# Patient Record
Sex: Male | Born: 1940
Health system: Southern US, Community
[De-identification: ages and names within clinical notes are randomized; demographics above are authoritative.]

## PROBLEM LIST (undated history)

## (undated) DIAGNOSIS — I6529 Occlusion and stenosis of unspecified carotid artery: Secondary | ICD-10-CM

## (undated) DIAGNOSIS — H9190 Unspecified hearing loss, unspecified ear: Secondary | ICD-10-CM

## (undated) DIAGNOSIS — G4730 Sleep apnea, unspecified: Secondary | ICD-10-CM

## (undated) DIAGNOSIS — K635 Polyp of colon: Secondary | ICD-10-CM

## (undated) DIAGNOSIS — E041 Nontoxic single thyroid nodule: Secondary | ICD-10-CM

## (undated) DIAGNOSIS — E785 Hyperlipidemia, unspecified: Secondary | ICD-10-CM

## (undated) DIAGNOSIS — Z87442 Personal history of urinary calculi: Secondary | ICD-10-CM

## (undated) DIAGNOSIS — N529 Male erectile dysfunction, unspecified: Secondary | ICD-10-CM

## (undated) DIAGNOSIS — K219 Gastro-esophageal reflux disease without esophagitis: Secondary | ICD-10-CM

## (undated) DIAGNOSIS — I10 Essential (primary) hypertension: Secondary | ICD-10-CM

## (undated) DIAGNOSIS — IMO0001 Reserved for inherently not codable concepts without codable children: Secondary | ICD-10-CM

## (undated) DIAGNOSIS — M1990 Unspecified osteoarthritis, unspecified site: Secondary | ICD-10-CM

## (undated) HISTORY — PX: LITHOTRIPSY: SUR834

## (undated) HISTORY — DX: Occlusion and stenosis of unspecified carotid artery: I65.29

## (undated) HISTORY — PX: EYE SURGERY: SHX253

## (undated) HISTORY — PX: HEMORRHOID SURGERY: SHX153

## (undated) HISTORY — PX: LAPAROSCOPIC DRAINAGE RENAL CYST: SUR764

## (undated) HISTORY — DX: Polyp of colon: K63.5

## (undated) HISTORY — PX: INGUINAL HERNIA REPAIR: SUR1180

## (undated) HISTORY — PX: JOINT REPLACEMENT: SHX530

## (undated) HISTORY — PX: CYSTOSCOPY W/ STONE MANIPULATION: SHX1427

## (undated) HISTORY — DX: Hyperlipidemia, unspecified: E78.5

## (undated) HISTORY — DX: Nontoxic single thyroid nodule: E04.1

## (undated) HISTORY — DX: Personal history of urinary calculi: Z87.442

## (undated) HISTORY — DX: Essential (primary) hypertension: I10

---

## 1999-04-22 ENCOUNTER — Emergency Department (HOSPITAL_COMMUNITY): Admission: EM | Admit: 1999-04-22 | Discharge: 1999-04-22 | Payer: Self-pay | Admitting: Emergency Medicine

## 1999-04-22 ENCOUNTER — Encounter: Payer: Self-pay | Admitting: Emergency Medicine

## 1999-06-14 ENCOUNTER — Ambulatory Visit (HOSPITAL_COMMUNITY): Admission: RE | Admit: 1999-06-14 | Discharge: 1999-06-14 | Payer: Self-pay | Admitting: Internal Medicine

## 1999-08-23 ENCOUNTER — Encounter: Payer: Self-pay | Admitting: Internal Medicine

## 1999-08-23 ENCOUNTER — Ambulatory Visit (HOSPITAL_COMMUNITY): Admission: RE | Admit: 1999-08-23 | Discharge: 1999-08-23 | Payer: Self-pay | Admitting: Internal Medicine

## 2000-04-30 ENCOUNTER — Encounter: Payer: Self-pay | Admitting: Internal Medicine

## 2000-04-30 ENCOUNTER — Encounter (INDEPENDENT_AMBULATORY_CARE_PROVIDER_SITE_OTHER): Payer: Self-pay

## 2000-04-30 ENCOUNTER — Ambulatory Visit (HOSPITAL_COMMUNITY): Admission: RE | Admit: 2000-04-30 | Discharge: 2000-04-30 | Payer: Self-pay | Admitting: Gastroenterology

## 2000-09-01 ENCOUNTER — Emergency Department (HOSPITAL_COMMUNITY): Admission: EM | Admit: 2000-09-01 | Discharge: 2000-09-01 | Payer: Self-pay | Admitting: Internal Medicine

## 2000-09-01 ENCOUNTER — Encounter: Payer: Self-pay | Admitting: Emergency Medicine

## 2000-09-02 ENCOUNTER — Ambulatory Visit (HOSPITAL_COMMUNITY): Admission: RE | Admit: 2000-09-02 | Discharge: 2000-09-02 | Payer: Self-pay | Admitting: Urology

## 2000-09-02 ENCOUNTER — Encounter: Payer: Self-pay | Admitting: Urology

## 2000-09-05 ENCOUNTER — Ambulatory Visit (HOSPITAL_COMMUNITY): Admission: RE | Admit: 2000-09-05 | Discharge: 2000-09-05 | Payer: Self-pay | Admitting: Urology

## 2000-09-05 ENCOUNTER — Encounter: Payer: Self-pay | Admitting: Urology

## 2000-09-07 ENCOUNTER — Observation Stay (HOSPITAL_COMMUNITY): Admission: EM | Admit: 2000-09-07 | Discharge: 2000-09-08 | Payer: Self-pay | Admitting: Emergency Medicine

## 2000-09-07 ENCOUNTER — Encounter: Payer: Self-pay | Admitting: Urology

## 2000-09-13 ENCOUNTER — Encounter: Admission: RE | Admit: 2000-09-13 | Discharge: 2000-09-13 | Payer: Self-pay | Admitting: Urology

## 2000-09-13 ENCOUNTER — Encounter: Payer: Self-pay | Admitting: Urology

## 2000-12-17 ENCOUNTER — Encounter: Admission: RE | Admit: 2000-12-17 | Discharge: 2000-12-17 | Payer: Self-pay | Admitting: Urology

## 2000-12-17 ENCOUNTER — Encounter: Payer: Self-pay | Admitting: Urology

## 2001-08-12 ENCOUNTER — Encounter: Payer: Self-pay | Admitting: Urology

## 2001-08-12 ENCOUNTER — Encounter: Admission: RE | Admit: 2001-08-12 | Discharge: 2001-08-12 | Payer: Self-pay | Admitting: Urology

## 2001-10-02 ENCOUNTER — Encounter: Admission: RE | Admit: 2001-10-02 | Discharge: 2001-10-02 | Payer: Self-pay | Admitting: Internal Medicine

## 2001-10-02 ENCOUNTER — Encounter: Payer: Self-pay | Admitting: Internal Medicine

## 2001-10-24 ENCOUNTER — Encounter: Payer: Self-pay | Admitting: Urology

## 2001-10-24 ENCOUNTER — Encounter (INDEPENDENT_AMBULATORY_CARE_PROVIDER_SITE_OTHER): Payer: Self-pay | Admitting: Specialist

## 2001-10-24 ENCOUNTER — Ambulatory Visit (HOSPITAL_COMMUNITY): Admission: RE | Admit: 2001-10-24 | Discharge: 2001-10-24 | Payer: Self-pay | Admitting: Urology

## 2002-01-30 ENCOUNTER — Ambulatory Visit (HOSPITAL_COMMUNITY): Admission: RE | Admit: 2002-01-30 | Discharge: 2002-01-30 | Payer: Self-pay | Admitting: Internal Medicine

## 2002-01-30 ENCOUNTER — Encounter: Payer: Self-pay | Admitting: Internal Medicine

## 2002-06-17 ENCOUNTER — Encounter: Admission: RE | Admit: 2002-06-17 | Discharge: 2002-06-17 | Payer: Self-pay | Admitting: Urology

## 2002-06-17 ENCOUNTER — Encounter: Payer: Self-pay | Admitting: Urology

## 2002-11-17 ENCOUNTER — Encounter: Payer: Self-pay | Admitting: Internal Medicine

## 2003-04-05 ENCOUNTER — Ambulatory Visit (HOSPITAL_BASED_OUTPATIENT_CLINIC_OR_DEPARTMENT_OTHER): Admission: RE | Admit: 2003-04-05 | Discharge: 2003-04-05 | Payer: Self-pay | Admitting: Urology

## 2003-04-05 ENCOUNTER — Ambulatory Visit (HOSPITAL_COMMUNITY): Admission: RE | Admit: 2003-04-05 | Discharge: 2003-04-05 | Payer: Self-pay | Admitting: Urology

## 2003-11-19 ENCOUNTER — Ambulatory Visit (HOSPITAL_COMMUNITY): Admission: RE | Admit: 2003-11-19 | Discharge: 2003-11-19 | Payer: Self-pay | Admitting: General Surgery

## 2004-04-03 ENCOUNTER — Ambulatory Visit: Payer: Self-pay | Admitting: Internal Medicine

## 2004-04-17 ENCOUNTER — Ambulatory Visit: Payer: Self-pay | Admitting: Internal Medicine

## 2004-04-19 ENCOUNTER — Encounter: Payer: Self-pay | Admitting: Internal Medicine

## 2004-04-19 ENCOUNTER — Ambulatory Visit: Payer: Self-pay

## 2005-01-03 ENCOUNTER — Ambulatory Visit: Payer: Self-pay | Admitting: Internal Medicine

## 2005-01-05 ENCOUNTER — Ambulatory Visit: Payer: Self-pay | Admitting: Internal Medicine

## 2005-01-29 ENCOUNTER — Ambulatory Visit: Payer: Self-pay | Admitting: Internal Medicine

## 2005-03-07 ENCOUNTER — Ambulatory Visit: Payer: Self-pay | Admitting: Internal Medicine

## 2005-03-13 ENCOUNTER — Ambulatory Visit: Payer: Self-pay | Admitting: Internal Medicine

## 2005-10-08 ENCOUNTER — Ambulatory Visit: Payer: Self-pay | Admitting: Internal Medicine

## 2005-10-22 ENCOUNTER — Ambulatory Visit: Payer: Self-pay | Admitting: Internal Medicine

## 2005-10-30 ENCOUNTER — Ambulatory Visit: Payer: Self-pay | Admitting: Internal Medicine

## 2005-11-14 ENCOUNTER — Encounter: Payer: Self-pay | Admitting: Internal Medicine

## 2005-11-14 ENCOUNTER — Ambulatory Visit: Payer: Self-pay

## 2006-03-18 ENCOUNTER — Ambulatory Visit: Payer: Self-pay | Admitting: Internal Medicine

## 2007-01-28 ENCOUNTER — Encounter: Payer: Self-pay | Admitting: Internal Medicine

## 2007-02-12 ENCOUNTER — Ambulatory Visit: Payer: Self-pay | Admitting: Internal Medicine

## 2007-02-12 DIAGNOSIS — E785 Hyperlipidemia, unspecified: Secondary | ICD-10-CM | POA: Insufficient documentation

## 2007-02-12 DIAGNOSIS — Z860100 Personal history of colon polyps, unspecified: Secondary | ICD-10-CM | POA: Insufficient documentation

## 2007-02-12 DIAGNOSIS — I10 Essential (primary) hypertension: Secondary | ICD-10-CM

## 2007-02-12 DIAGNOSIS — Z87442 Personal history of urinary calculi: Secondary | ICD-10-CM

## 2007-02-13 ENCOUNTER — Encounter: Payer: Self-pay | Admitting: Internal Medicine

## 2007-02-13 LAB — CONVERTED CEMR LAB
ALT: 62 units/L — ABNORMAL HIGH (ref 0–53)
AST: 40 units/L — ABNORMAL HIGH (ref 0–37)
Albumin: 4.3 g/dL (ref 3.5–5.2)
Alkaline Phosphatase: 49 units/L (ref 39–117)
BUN: 20 mg/dL (ref 6–23)
Bilirubin, Direct: 0.3 mg/dL (ref 0.0–0.3)
CO2: 36 meq/L — ABNORMAL HIGH (ref 19–32)
Calcium: 9.4 mg/dL (ref 8.4–10.5)
Chloride: 103 meq/L (ref 96–112)
Cholesterol: 182 mg/dL (ref 0–200)
Creatinine, Ser: 1.2 mg/dL (ref 0.4–1.5)
GFR calc Af Amer: 78 mL/min
GFR calc non Af Amer: 65 mL/min
Glucose, Bld: 104 mg/dL — ABNORMAL HIGH (ref 70–99)
HDL: 33.2 mg/dL — ABNORMAL LOW (ref >39.0)
LDL Cholesterol: 122 mg/dL — ABNORMAL HIGH (ref 0–99)
Potassium: 4.4 meq/L (ref 3.5–5.1)
Sodium: 143 meq/L (ref 135–145)
Total Bilirubin: 1.2 mg/dL (ref 0.3–1.2)
Total CHOL/HDL Ratio: 5.5
Total Protein: 7.3 g/dL (ref 6.0–8.3)
Triglycerides: 135 mg/dL (ref 0–149)
VLDL: 27 mg/dL (ref 0–40)

## 2007-02-28 ENCOUNTER — Telehealth: Payer: Self-pay | Admitting: Internal Medicine

## 2007-03-07 ENCOUNTER — Telehealth: Payer: Self-pay | Admitting: Internal Medicine

## 2007-03-10 LAB — CONVERTED CEMR LAB
HCV Ab: NEGATIVE
Hep B S Ab: NEGATIVE
Hepatitis B Surface Ag: POSITIVE — AB

## 2007-03-11 ENCOUNTER — Ambulatory Visit: Payer: Self-pay | Admitting: Internal Medicine

## 2007-03-13 ENCOUNTER — Telehealth: Payer: Self-pay | Admitting: *Deleted

## 2007-05-19 ENCOUNTER — Ambulatory Visit: Payer: Self-pay | Admitting: Internal Medicine

## 2007-05-27 ENCOUNTER — Ambulatory Visit: Payer: Self-pay | Admitting: Internal Medicine

## 2007-07-28 ENCOUNTER — Telehealth: Payer: Self-pay | Admitting: Internal Medicine

## 2007-08-01 ENCOUNTER — Encounter: Payer: Self-pay | Admitting: Internal Medicine

## 2007-09-17 ENCOUNTER — Encounter: Payer: Self-pay | Admitting: Internal Medicine

## 2007-10-09 ENCOUNTER — Encounter: Payer: Self-pay | Admitting: Internal Medicine

## 2008-01-06 ENCOUNTER — Ambulatory Visit: Payer: Self-pay | Admitting: Internal Medicine

## 2008-04-20 ENCOUNTER — Ambulatory Visit: Payer: Self-pay | Admitting: Internal Medicine

## 2008-08-09 ENCOUNTER — Ambulatory Visit: Payer: Self-pay | Admitting: Family Medicine

## 2008-08-25 ENCOUNTER — Ambulatory Visit: Payer: Self-pay | Admitting: Internal Medicine

## 2008-11-18 LAB — HM COLONOSCOPY

## 2008-11-24 ENCOUNTER — Telehealth (INDEPENDENT_AMBULATORY_CARE_PROVIDER_SITE_OTHER): Payer: Self-pay | Admitting: *Deleted

## 2009-03-07 ENCOUNTER — Ambulatory Visit: Payer: Self-pay | Admitting: Internal Medicine

## 2009-03-08 LAB — CONVERTED CEMR LAB
ALT: 35 units/L (ref 0–53)
AST: 31 units/L (ref 0–37)
Albumin: 4.2 g/dL (ref 3.5–5.2)
Alkaline Phosphatase: 43 units/L (ref 39–117)
BUN: 15 mg/dL (ref 6–23)
Basophils Absolute: 0 10*3/uL (ref 0.0–0.1)
Basophils Relative: 0.2 % (ref 0.0–3.0)
Bilirubin, Direct: 0.1 mg/dL (ref 0.0–0.3)
CO2: 34 meq/L — ABNORMAL HIGH (ref 19–32)
Calcium: 9 mg/dL (ref 8.4–10.5)
Chloride: 100 meq/L (ref 96–112)
Cholesterol: 166 mg/dL (ref 0–200)
Creatinine, Ser: 1 mg/dL (ref 0.4–1.5)
Direct LDL: 106.9 mg/dL
Eosinophils Absolute: 0.2 10*3/uL (ref 0.0–0.7)
Eosinophils Relative: 2.7 % (ref 0.0–5.0)
Folate: 17.9 ng/mL
GFR calc non Af Amer: 79.07 mL/min (ref >60)
Glucose, Bld: 94 mg/dL (ref 70–99)
HCT: 48.2 % (ref 39.0–52.0)
HDL: 36 mg/dL — ABNORMAL LOW (ref >39.00)
Hemoglobin: 16.7 g/dL (ref 13.0–17.0)
Lymphocytes Relative: 29.4 % (ref 12.0–46.0)
Lymphs Abs: 2.1 10*3/uL (ref 0.7–4.0)
MCHC: 34.5 g/dL (ref 30.0–36.0)
MCV: 93.6 fL (ref 78.0–100.0)
Monocytes Absolute: 0.4 10*3/uL (ref 0.1–1.0)
Monocytes Relative: 5.5 % (ref 3.0–12.0)
Neutro Abs: 4.4 10*3/uL (ref 1.4–7.7)
Neutrophils Relative %: 62.2 % (ref 43.0–77.0)
PSA: 1.72 ng/mL (ref 0.10–4.00)
Platelets: 161 10*3/uL (ref 150.0–400.0)
Potassium: 4.2 meq/L (ref 3.5–5.1)
RBC: 5.15 M/uL (ref 4.22–5.81)
RDW: 12.6 % (ref 11.5–14.6)
Sodium: 144 meq/L (ref 135–145)
TSH: 1.01 microintl units/mL (ref 0.35–5.50)
Total Bilirubin: 1 mg/dL (ref 0.3–1.2)
Total CHOL/HDL Ratio: 5
Total Protein: 7.5 g/dL (ref 6.0–8.3)
Triglycerides: 206 mg/dL — ABNORMAL HIGH (ref 0.0–149.0)
VLDL: 41.2 mg/dL — ABNORMAL HIGH (ref 0.0–40.0)
Vitamin B-12: 580 pg/mL (ref 211–911)
WBC: 7.1 10*3/uL (ref 4.5–10.5)

## 2009-11-16 ENCOUNTER — Encounter: Payer: Self-pay | Admitting: Internal Medicine

## 2009-12-16 ENCOUNTER — Encounter: Payer: Self-pay | Admitting: Internal Medicine

## 2010-04-26 ENCOUNTER — Ambulatory Visit: Payer: Self-pay | Admitting: Internal Medicine

## 2010-05-02 LAB — CONVERTED CEMR LAB
AST: 25 units/L (ref 0–37)
Alkaline Phosphatase: 55 units/L (ref 39–117)
CO2: 29 meq/L (ref 19–32)
Calcium: 9.2 mg/dL (ref 8.4–10.5)
Creatinine, Ser: 1 mg/dL (ref 0.4–1.5)
GFR calc non Af Amer: 80.66 mL/min (ref >60.00)
Glucose, Bld: 91 mg/dL (ref 70–99)
Total Bilirubin: 0.8 mg/dL (ref 0.3–1.2)
Total CHOL/HDL Ratio: 4
Triglycerides: 147 mg/dL (ref 0.0–149.0)

## 2010-06-20 NOTE — Letter (Signed)
Summary: [facility]  [facility]   Imported By: Maryln Gottron 12/09/2009 12:52:53  _____________________________________________________________________  External Attachment:    Type:   Image     Comment:   External Document

## 2010-06-20 NOTE — Letter (Signed)
Summary: [facility]  [facility]   Imported By: Maryln Gottron 01/04/2010 13:28:08  _____________________________________________________________________  External Attachment:    Type:   Image     Comment:   External Document

## 2010-06-20 NOTE — Assessment & Plan Note (Signed)
Summary: emp/pt fasting/cjr   Vital Signs:  Patient profile:   70 year old male Height:      67.5 inches Weight:      179 pounds BMI:     27.72 Temp:     98.3 degrees F oral Pulse rate:   76 / minute Resp:     18 per minute BP sitting:   120 / 78  (left arm)  Vitals Entered By: Jeremy Johann CMA (April 26, 2010 9:07 AM) CC: yearly, fasting, flu shot   CC:  yearly, fasting, and flu shot.  History of Present Illness: Here for Medicare AWV:  1.   Risk factors based on Past M, S, F history:---see list 2.   Physical Activities: -- does his own yard work, exercises frequently 3.   Depression/mood: no concerns 4.   Hearing: ---wears hearing aids 5.   ADL's: --no concerns 6.   Fall Risk: --none noted 7.   Home Safety: --no concerns.  8.   Height, weight, &visual acuity--see exam---yearly eye exam: 9.   Counseling: -- advised continued excellent health habits 10.   Labs ordered based on risk factors: ---see orders 11.           Referral Coordination----none necessary 12.           Care Plan---advised regular exercise 13.            Cognitive Assessment---pt is alert, oriented, no motor or sensory deficits  Current Problems:  HYPERTENSION (ICD-401.9): tolerating meds without difficulty HYPERLIPIDEMIA (ICD-272.4): tolerating meds---needs f/u COLONIC POLYPS, HX OF (ICD-V12.72)---has regular f/u with GI   All other systems reviewed and were negative   Current Problems (verified): 1)  Hypertension  (ICD-401.9) 2)  Nephrolithiasis, Hx of  (ICD-V13.01) 3)  Hyperlipidemia  (ICD-272.4) 4)  Colonic Polyps, Hx of  (ICD-V12.72)  Current Medications (verified): 1)  Diovan Hct 80-12.5 Mg  Tabs (Valsartan-Hydrochlorothiazide) .... One By Mouth Daily 2)  Simvastatin 80 Mg  Tabs (Simvastatin) .... Take 1/2 By Mouth Every Bedtime 3)  Aspirin 81 Mg  Tbec (Aspirin) .... One By Mouth Daily  Allergies (verified): No Known Drug Allergies  Past History:  Past Medical History: Last  updated: 02-14-07 Colonic polyps, hx of Hyperlipidemia Nephrolithiasis, hx of carotid plaque--mild Hypertension  Past Surgical History: Last updated: 14-Feb-2007 Renal cysts drained. lithotripsy Hemorrhoidectomy Inguinal herniorrhaphy  Family History: Last updated: February 14, 2007 father deceased colon CA Family History of Colon CA 1st degree relative  mother mi at 79 yo Family History Diabetes 1st degree relative  Social History: Last updated: 2007/02/14 Married Never Smoked Regular exercise-no  Risk Factors: Exercise: no (Feb 14, 2007)  Risk Factors: Smoking Status: never (03/07/2009)   Impression & Recommendations:  Problem # 1:  Preventive Health Care (ICD-V70.0)  Problem # 2:  HYPERTENSION (ICD-401.9)  His updated medication list for this problem includes:    Diovan Hct 80-12.5 Mg Tabs (Valsartan-hydrochlorothiazide) ..... One by mouth daily  BP today: 120/78 Prior BP: 122/82 (03/07/2009)  Prior 10 Yr Risk Heart Disease: 22 % (05/19/2007)  Labs Reviewed: K+: 4.2 (03/07/2009) Creat: : 1.0 (03/07/2009)   Chol: 166 (03/07/2009)   HDL: 36.00 (03/07/2009)   LDL: 122 (02/14/07)   TG: 206.0 (03/07/2009)  Orders: TLB-BMP (Basic Metabolic Panel-BMET) (80048-METABOL)  Problem # 3:  HYPERLIPIDEMIA (ICD-272.4)  His updated medication list for this problem includes:    Simvastatin 80 Mg Tabs (Simvastatin) .Marland Kitchen... Take 1/2 by mouth every bedtime  Labs Reviewed: SGOT: 31 (03/07/2009)   SGPT: 35 (03/07/2009)  Prior  10 Yr Risk Heart Disease: 22 % (05/19/2007)   HDL:36.00 (03/07/2009), 33.2 (02/12/2007)  LDL:122 (02/12/2007)  Chol:166 (03/07/2009), 182 (02/12/2007)  Trig:206.0 (03/07/2009), 135 (02/12/2007)  Orders: TLB-Lipid Panel (80061-LIPID) TLB-Hepatic/Liver Function Pnl (80076-HEPATIC) TLB-TSH (Thyroid Stimulating Hormone) (84443-TSH)  Problem # 4:  NEPHROLITHIASIS, HX OF (ICD-V13.01)  Complete Medication List: 1)  Diovan Hct 80-12.5 Mg Tabs  (Valsartan-hydrochlorothiazide) .... One by mouth daily 2)  Simvastatin 80 Mg Tabs (Simvastatin) .... Take 1/2 by mouth every bedtime 3)  Aspirin 81 Mg Tbec (Aspirin) .... One by mouth daily  Other Orders: Flu Vaccine 16yrs + MEDICARE PATIENTS (E4540) Administration Flu vaccine - MCR (G0008) TLB-PSA (Prostate Specific Antigen) (84153-PSA) Medicare -1st Annual Wellness Visit [phone])   Orders Added: 1)  Flu Vaccine 43yrs + MEDICARE PATIENTS [Q2039] 2)  Administration Flu vaccine - MCR [G0008] 3)  TLB-BMP (Basic Metabolic Panel-BMET) [80048-METABOL] 4)  TLB-Lipid Panel [80061-LIPID] 5)  TLB-Hepatic/Liver Function Pnl [80076-HEPATIC] 6)  TLB-TSH (Thyroid Stimulating Hormone) [84443-TSH] 7)  TLB-PSA (Prostate Specific Antigen) [14782-NFA] 8)  Medicare -1st Annual Wellness Visit [G0438] 9)  Est. Patient Level III [21308]   .medflu Flu Vaccine Consent Questions     Do you have a history of severe allergic reactions to this vaccine? no    Any prior history of allergic reactions to egg and/or gelatin? no    Do you have a sensitivity to the preservative Thimersol? no    Do you have a past history of Guillan-Barre Syndrome? no    Do you currently have an acute febrile illness? no    Have you ever had a severe reaction to latex? no    Vaccine information given and explained to patient? yes    Are you currently pregnant? no    Lot Number:[id number]   Exp Date:11/18/2010   Manufacturer: [employer]    Site Given  Left Deltoid IM Physical Exam General Appearance: well developed, well nourished, Eyes: conjunctiva and lids normal, PERRLA, EOMI, fundi WNL Ears, Nose, Mouth, Throat: TM clear, nares clear, oral exam WNL Neck: supple, no lymphadenopathy, no thyromegaly, no JVD Respiratory: clear to auscultation and percussion, respiratory effort normal Cardiovascular: regular rate and rhythm, S1-S2, no murmur, rub or gallop, no bruits, peripheral pulses normal and symmetric, no cyanosis, clubbing,  edema or varicosities Chest: no scars, masses, tenderness; no asymmetry, skin changes,  Gastrointestinal: soft, non-tender; no hepatosplenomegaly, masses; active bowel sounds all quadrants,  no masses, tenderness, hemorrhoids  Genitourinary: no hernia, testicular mass,or prostate moderate enlargement without nodules or assymetry Lymphatic: no cervical, axillary or inguinal adenopathy Musculoskeletal: gait normal, muscle tone and strength WNL, no joint swelling, effusions, discoloration, crepitus  Skin: clear, good turgor, color WNL, no rashes, lesions, or ulcerations Neurologic: normal mental status, normal reflexes, normal strength, sensation, and motion Psychiatric: alert; oriented to person, place and time Other Exam:

## 2010-06-23 ENCOUNTER — Other Ambulatory Visit: Payer: Self-pay | Admitting: Internal Medicine

## 2010-06-23 DIAGNOSIS — E785 Hyperlipidemia, unspecified: Secondary | ICD-10-CM

## 2010-09-03 ENCOUNTER — Other Ambulatory Visit: Payer: Self-pay | Admitting: Internal Medicine

## 2010-10-06 NOTE — Op Note (Signed)
   [name], [name]                        ACCOUNT NO.:  [id number]   MEDICAL RECORD NO.:  [id number]                   PATIENT TYPE:  AMB   LOCATION:  [facility]                                 FACILITY:  [facility]   PHYSICIAN:  Valetta Fuller, M.D.               DATE OF BIRTH:  [birth year]   DATE OF PROCEDURE:  04/05/2003  DATE OF DISCHARGE:                                 OPERATIVE REPORT   PREOPERATIVE DIAGNOSIS:  Right proximal ureteral stone.   POSTOPERATIVE DIAGNOSIS:  Right proximal ureteral stone.   PROCEDURE PERFORMED:  1. Cystoscopy.  2. Retrograde pyelography.  3. Right ureteroscopy.  4. Holmium laser lithotripsy with basketing of fragment.   SURGEON:  Valetta Fuller, M.D.   ANESTHESIA:  General.   INDICATIONS:  Mr. Belt is a 70 year old male.  He began having gross  hematuria, and a CT showed a 6 mm right proximal ureteral stone.  He has had  minimal pain but has shown no evidence of progression of the stone for  approximately one month.  We recommended consideration for intervention.  He  has had calcium oxalate and monohydrate stones in the past and has failed  lithotripsy.  Therefore, we have recommended ureteroscopy.  The patient has  requested that we try to do this without double-J stent placement because he  had great difficulties with that in the past.   TECHNIQUE AND FINDINGS:  The patient was brought to the operating room where  he had successful induction of general anesthesia.  He was placed in  lithotomy position and prepped and draped in the usual manner.  Cystoscopy  revealed some mild trilobar hyperplasia an unremarkable bladder.  Retrograde  pyelogram showed a filling defect in the proximal right ureter without  evidence of significant obstruction.  We were able to get a guidewire past  the stone into the renal pelvis without difficulty.  Ureteroscopy was  performed without the need for dilation.  A 6 mm stone was encountered in  the proximal  ureter.  There did not appear to be a lot of mucosal edema or  inflammation.  The stone was broken up approximately 40% with a holmium  laser.  The stone was very hard and chipped away.  The remaining stone was  then basket-extracted.  There was really no trauma or need for ureteral  dilation, and therefore a double-J stent was not placed.  The patient  tolerated the procedure well, and there were no obvious complications.                                               Valetta Fuller, M.D.    DSG/MEDQ  D:  04/05/2003  T:  04/05/2003  Job:  [id number]

## 2010-10-06 NOTE — Op Note (Signed)
[facility]  Patient:    [name], [name]                     MRN: [id number] Proc. Date: 09/08/00 Adm. Date:  [id number] Disc. Date: [id number] Attending:  Thermon Leyland CC:         Excell Seltzer. Annabell Howells, M.D.  Bruce Rexene Edison Swords, M.D. [facility]   Operative Report  PREOPERATIVE DIAGNOSES: 1. Left proximal ureteral calculus. 2. Flank pain status post lithotripsy.  POSTOPERATIVE DIAGNOSES: 1. Left proximal ureteral calculus. 2. Flank pain status post lithotripsy.  PROCEDURE:  Cystoscopy, ureteroscopy, holmium laser lithotripsy and double J stent placement.  SURGEON:  Dr. Isabel Caprice.  ANESTHESIA:  General.  INDICATIONS FOR PROCEDURE:  [name] is a 70 year old male. Approximately a week ago, he presented to the emergency room with typical left renal colic. He was diagnosed with a 4-5 mm calcification in the proximal left ureter just beneath the ureteral pelvic junction. His initial lithotripsy was postponed because of an elevated bleeding time, but approximately three days prior to this procedure the patient underwent lithotripsy by Dr. Bjorn Pippin. Apparently the family was told that the stone did not appear to fracture very well. He has continued to have severe flank pain and contacted me yesterday with severe discomfort. He was evaluated in the emergency room and noted to have continued presence of a 4-5 mm stone in the proximal left ureter that appeared probably unchanged from prelithotripsy. He was admitted for pain control. Because of ongoing discomfort and the request of the patient to have this managed definitively, he presents now for ureteroscopy. He was told that given the proximal nature of the stone that it may be difficult to treat the stone definitively but that at the very latest we would put a double J stent. He was told if we could access the stone that we would certainly attempt a basket extraction or holmium lithotripsy. He was told all  the potential risks of the ureteroscopy, informed signed consent was obtained.  TECHNIQUE AND FINDINGS:  The patient was brought to the operating room where he had successful induction of general anesthesia. He was placed in lithotomy, prepped and draped in the usual manner. On fluoroscopy, one could see a 4-5 mm calcification right off the vertebral body of L4. For that reason, we did not do a retrograde pyelogram because we felt it might potentially cause some migration of the stone. The Glidewire was able to be passed beyond the stone without much difficulty. The ureteral orifice was quite tight and for that reason, I used a one step dilating sheath to dilate the distal ureter to 14 french. A long 6.5 ureteroscope was then engaged in the ureter without difficulty. As we got up to the very proximal ureter, the mucosal portion of the ureter was quite edematous and an impacted stone was noted in the very proximal ureter. This again measured 4-5 mm. It did not appear to be very easy to get a basket by this stone and I was concerned about proximal migration. For that reason, I went ahead and used the holmium laser fiber. We were able to fracture the stone into a innumerable pieces no larger than 1-2 mm. A few of these fragments were basket extracted and no additional calculi remained. Over the guidewire, a 24 cm 7 French stent was placed. I elected not to leave a dangle string because of the significant amount of swelling and edema in the proximal ureter. I felt  that it would probably be to the patients best interest to have a stent indwelling for about a week and for that reason will plan on cystoscopic extraction. The patient was brought to the recovery room in stable condition. DD:  09/08/00 TD:  09/09/00 Job: [id number]

## 2010-10-06 NOTE — Op Note (Signed)
[name], [name]                        ACCOUNT NO.:  [id number]   MEDICAL RECORD NO.:  [id number]                   PATIENT TYPE:  AMB   LOCATION:  DAY                                  [facility]ACILITY:  [facility]   PHYSICIAN:  Adolph Pollack, M.D.            DATE OF BIRTH:  11-May-1941   DATE OF PROCEDURE:  11/19/2003  DATE OF DISCHARGE:                                 OPERATIVE REPORT   PREOPERATIVE DIAGNOSIS:  Right inguinal hernia.   POSTOPERATIVE DIAGNOSIS:  Indirect right inguinal hernia.   OPERATION/PROCEDURE:  Laparoscopic repair of a right inguinal hernia with  mesh.   SURGEON:  Adolph Pollack, M.D.   ANESTHESIA:  General.   INDICATIONS:  This is a 70 year old male who I saw back in January with an  obvious right inguinal hernia.  He had some family matters to attend to.  I  saw him back in the office recently and he wanted to have repair and thus  presents for that now.  He has chosen the laparoscopic route.  The  procedure and the risks were discussed with him preoperatively.   DESCRIPTION OF PROCEDURE:  He was seen in the holding area and the right hip  was marked.  He was then brought to the operating room and placed upon the  operating table and general anesthesia was administered.  The hair from his  lower abdominal wall was clipped and a Foley catheter was placed in the  bladder.  The lower abdominal wall was sterilely prepped and draped.  Dilute  Marcaine solution was infiltrated in the subumbilical region and a  transferred subumbilical incision was made through the skin and subcutaneous  tissue.  I rotated the right anterior rectus muscle and placed a small  incision in it and in attempting to dissect the rectus muscle, free and then  localizing the posterior rectus sheath, it was noted that the rectus sheath  was very thin and I did get into the peritoneal cavity.  I decided to go  ahead and enclose this fascial defect primarily with a figure-of-eight  Vicryl suture.  I subsequently approached the left side and went more  lateral and made an incision in the anterior rectus sheath on the right  side.  I swept the rectus muscle laterally and identified the posterior  rectus sheath and I carefully dissected the muscle free from this.  A  balloon dissection device was then placed into the extraperitoneal space  under laparoscopic vision.  Balloon dissection was performed preferentially  on the right side.  I then removed the balloon dissection device and placed  a trocar into the extra peritoneum space, insufflated CO2 gas in it.  The  laparoscope was introduced.  Two 5 mm trocars were then placed under  laparoscopic vision into the extraperitoneal space, just to the left of the  midline.  Using blunt dissection to identify Cooper's ligament at the  symphysis pubis  on the right side.  The direct area appeared to be solid.  I  subsequently separated the fibrofatty tissue from the _________ and anterior  abdominal wall to the level of the umbilicus.  An indirect hernia was noted.  The sac was reduced as well as the lipoma at the cord.  I then isolated the  spermatic cord.  Following this, a piece of 5 x 6-inch polypropylene mesh  with a partial longitudinal slit cut into it was then placed into the  abdominal cavity.  The two tails were wrapped around the cord and the mesh  was positioned appropriately to cover the direct, indirect and femoral  spaces.  It was then anchored to Cooper's ligament and the  lateral  abdominal wall with spiral tacks.  The two tails were then crossed creating  a new internal ring.  At this point hemostasis was noted to be adequate.  I  then held the inferolateral aspect of the mesh down with a blunt instrument  and released the CO2 gas and watched the extraperitoneal tissue approximate  the mesh. I then removed all the instruments.  He did have somewhat of a  pneumoperitoneum and I put a Veress needle to the  peritoneal cavity and  evacuated the CO2 in that fashion.  I then removed the Veress needle.  The  fascial defect and left anterior rectus sheath was closed with interrupted  Vicryl sutures.  Skin incisions were then closed with 4-0 Monocryl  subcuticular stitches.  Steri-Strips and sterile dressings were applied.   He tolerated the procedure well without any apparent complications.  He was  taken to the recovery room in satisfactory condition.   The plan will be to send him home as an outpatient.  I have given him  operative instructions and Tylox for pain.                                               Adolph Pollack, M.D.    [name]  D:  11/19/2003  T:  11/19/2003  Job:  [id number]   cc:   [name], M.D. [facility]   [name], M.D.  [address]  [place]  [place] [address]  Fax: [phone]

## 2011-01-05 ENCOUNTER — Other Ambulatory Visit: Payer: Self-pay | Admitting: Internal Medicine

## 2011-02-23 ENCOUNTER — Encounter: Payer: Self-pay | Admitting: Internal Medicine

## 2011-02-24 ENCOUNTER — Ambulatory Visit (INDEPENDENT_AMBULATORY_CARE_PROVIDER_SITE_OTHER): Payer: 59 | Admitting: Family Medicine

## 2011-02-24 ENCOUNTER — Ambulatory Visit: Payer: Self-pay | Admitting: Family Medicine

## 2011-02-24 ENCOUNTER — Encounter: Payer: Self-pay | Admitting: Family Medicine

## 2011-02-24 VITALS — BP 108/72 | HR 64 | Temp 98.7°F | Wt 165.0 lb

## 2011-02-24 DIAGNOSIS — J029 Acute pharyngitis, unspecified: Secondary | ICD-10-CM

## 2011-02-24 LAB — POCT RAPID STREP A (OFFICE): Rapid Strep A Screen: NEGATIVE

## 2011-02-24 NOTE — Progress Notes (Signed)
  Subjective:    Patient ID: [name], male    DOB: [birth year], 70 y.o.   MRN: [id number]  HPI  ST for 3 days.  No fever.  No pain with swallowing.  No sinus sxs. No hx of allergies. + strep exposure from grandkids. No ear pain or pressue.  Using lozenges.    Review of Systems     Objective:   Physical Exam  Constitutional: He is oriented to person, place, and time. He appears well-developed and well-nourished.  HENT:  Head: Normocephalic and atraumatic.  Right Ear: External ear normal.  Left Ear: External ear normal.  Nose: Nose normal.  Mouth/Throat: Oropharynx is clear and moist.       TMs and canals are clear.   Eyes: Conjunctivae and EOM are normal. Pupils are equal, round, and reactive to light.  Neck: Neck supple. No thyromegaly present.  Cardiovascular: Normal rate and normal heart sounds.   Pulmonary/Chest: Effort normal and breath sounds normal.  Lymphadenopathy:    He has no cervical adenopathy.  Neurological: He is alert and oriented to person, place, and time.  Skin: Skin is warm and dry.  Psychiatric: He has a normal mood and affect.          Assessment & Plan:  Pharyngitis - strep is neg. Like viral or drainage from the season change causing mild throat irritation. Call if getting worse or not resolving.

## 2011-04-16 ENCOUNTER — Ambulatory Visit
Admission: RE | Admit: 2011-04-16 | Discharge: 2011-04-16 | Disposition: A | Discharge status: Home / Self Care | Payer: 59 | Source: Ambulatory Visit | Attending: Internal Medicine | Admitting: Internal Medicine

## 2011-04-16 ENCOUNTER — Other Ambulatory Visit: Payer: Self-pay | Admitting: *Deleted

## 2011-04-16 DIAGNOSIS — R05 Cough: Secondary | ICD-10-CM

## 2011-05-02 ENCOUNTER — Ambulatory Visit (INDEPENDENT_AMBULATORY_CARE_PROVIDER_SITE_OTHER): Payer: Medicare Other | Admitting: Internal Medicine

## 2011-05-02 VITALS — BP 112/74 | HR 60 | Temp 97.9°F | Ht 67.0 in | Wt 171.0 lb

## 2011-05-02 DIAGNOSIS — N401 Enlarged prostate with lower urinary tract symptoms: Secondary | ICD-10-CM

## 2011-05-02 DIAGNOSIS — N139 Obstructive and reflux uropathy, unspecified: Secondary | ICD-10-CM

## 2011-05-02 DIAGNOSIS — Z23 Encounter for immunization: Secondary | ICD-10-CM

## 2011-05-02 DIAGNOSIS — I10 Essential (primary) hypertension: Secondary | ICD-10-CM

## 2011-05-02 DIAGNOSIS — E785 Hyperlipidemia, unspecified: Secondary | ICD-10-CM

## 2011-05-02 DIAGNOSIS — N138 Other obstructive and reflux uropathy: Secondary | ICD-10-CM

## 2011-05-02 DIAGNOSIS — Z0000 Encounter for general adult medical examination without abnormal findings: Secondary | ICD-10-CM

## 2011-05-02 LAB — CBC WITH DIFFERENTIAL/PLATELET
Basophils Absolute: 0 10*3/uL (ref 0.0–0.1)
Eosinophils Absolute: 0.2 10*3/uL (ref 0.0–0.7)
HCT: 47.8 % (ref 39.0–52.0)
Lymphs Abs: 1.9 10*3/uL (ref 0.7–4.0)
MCHC: 34.3 g/dL (ref 30.0–36.0)
MCV: 93.9 fl (ref 78.0–100.0)
Monocytes Absolute: 0.3 10*3/uL (ref 0.1–1.0)
Platelets: 162 10*3/uL (ref 150.0–400.0)
RDW: 13.5 % (ref 11.5–14.6)

## 2011-05-02 LAB — HEPATIC FUNCTION PANEL
ALT: 27 U/L (ref 0–53)
AST: 21 U/L (ref 0–37)
Alkaline Phosphatase: 48 U/L (ref 39–117)
Total Bilirubin: 0.8 mg/dL (ref 0.3–1.2)

## 2011-05-02 LAB — BASIC METABOLIC PANEL
BUN: 23 mg/dL (ref 6–23)
CO2: 32 mEq/L (ref 19–32)
Calcium: 9.2 mg/dL (ref 8.4–10.5)
Chloride: 102 mEq/L (ref 96–112)
Creatinine, Ser: 1 mg/dL (ref 0.4–1.5)

## 2011-05-02 LAB — LIPID PANEL
Total CHOL/HDL Ratio: 3
Triglycerides: 91 mg/dL (ref 0.0–149.0)

## 2011-05-02 NOTE — Assessment & Plan Note (Signed)
BP Readings from Last 3 Encounters:  05/02/11 112/74  02/24/11 108/72  04/26/10 120/78   Well controlled Continue same meds

## 2011-05-02 NOTE — Progress Notes (Signed)
Subjective:    [name] is a 70 y.o. male who presents for Medicare Annual/Subsequent preventive examination.   Preventive Screening-Counseling & Management  Tobacco History  Smoking status  . Never Smoker   Smokeless tobacco  . Not on file    Problems Prior to Visit 1. Reviewed medical problems and medications  States he may be a little more fatigued then usual. He does not exercise.   Current Problems (verified) Patient Active Problem List  Diagnoses  . HYPERLIPIDEMIA  . HYPERTENSION  . COLONIC POLYPS, HX OF  . NEPHROLITHIASIS, HX OF    Medications Prior to Visit Current Outpatient Prescriptions on File Prior to Visit  Medication Sig Dispense Refill  . aspirin 81 MG tablet Take 81 mg by mouth daily.        Marland Kitchen DIOVAN HCT 80-12.5 MG per tablet TAKE 1 TABLET EVERY DAY  90 tablet  2  . simvastatin (ZOCOR) 80 MG tablet TAKE 1/2 TABLET BY MOUTH AT BEDTIME  45 tablet  1    Current Medications (verified) Current Outpatient Prescriptions  Medication Sig Dispense Refill  . aspirin 81 MG tablet Take 81 mg by mouth daily.        Marland Kitchen DIOVAN HCT 80-12.5 MG per tablet TAKE 1 TABLET EVERY DAY  90 tablet  2  . simvastatin (ZOCOR) 80 MG tablet TAKE 1/2 TABLET BY MOUTH AT BEDTIME  45 tablet  1     Allergies (verified) Review of patient's allergies indicates no known allergies.   PAST HISTORY  Family History Family History  Problem Relation Age of Onset  . Colon cancer Father   . Heart attack Mother 22  . Diabetes      1st degree relative    Social History History  Substance Use Topics  . Smoking status: Never Smoker   . Smokeless tobacco: Not on file  . Alcohol Use: No    Are there smokers in your home (other than you)?  No  Risk Factors Current exercise habits: The patient does not participate in regular exercise at present.  Dietary issues discussed: he follows a healthy diet   Cardiac risk factors: advanced age (older than 64 for men, 50 for women),  dyslipidemia and hypertension.  Depression Screen (Note: if answer to either of the following is "Yes", a more complete depression screening is indicated)   Q1: Over the past two weeks, have you felt down, depressed or hopeless? No  Activities of Daily Living In your present state of health, do you have any difficulty performing the following activities?:  Driving? No Managing money?  No Feeding yourself? No   Hearing Difficulties: No---uses hearing aids---sees dr. Jac Canavan  Do you feel that you have a problem with memory? No    Cognitive Testing  Alert? Yes  Normal Appearance?Yes  Oriented to person? Yes  Place? Yes     List the Names of Other Physician/Practitioners you currently use: 1.    Indicate any recent Medical Services you may have received from other than [facility] providers in the past year (date may be approximate).  Immunization History  Administered Date(s) Administered  . Influenza Whole 03/07/2009, 04/26/2010  . Pneumococcal Conjugate 02/12/2007  . Pneumococcal Polysaccharide 03/07/2009  . Td 07/16/2003  . Zoster 01/06/2008    Screening Tests Health Maintenance  Topic Date Due  . Influenza Vaccine  02/19/2011  . Tetanus/tdap  07/15/2013  . Colonoscopy  11/19/2018  . Pneumococcal Polysaccharide Vaccine Age 41 And Over  Completed  . Zostavax  Completed    All answers were reviewed with the patient and necessary referrals were made:  Judie Petit, MD   05/02/2011   History reviewed: allergies, current medications, past family history, past medical history, past social history, past surgical history and problem list  Review of Systems   patient denies chest pain, shortness of breath, orthopnea. Denies lower extremity edema, abdominal pain, change in appetite, change in bowel movements. Patient denies rashes, musculoskeletal complaints. No other specific complaints in a complete review of systems.    Objective:      Blood pressure 112/74, pulse 60,  temperature 97.9 F (36.6 C), temperature source Oral, height 5\' 7"  (1.702 m), weight 171 lb (77.565 kg). Body mass index is 26.78 kg/(m^2).  Well-developed male in no acute distress. HEENT exam atraumatic, normocephalic, extraocular muscles are intact. Conjunctivae are pink without exudate. Neck is supple without lymphadenopathy, thyromegaly, jugular venous distention. Chest is clear to auscultation without increased work of breathing. Cardiac exam S1-S2 are regular. The PMI is normal. No significant murmurs or gallops. Abdominal exam active bowel sounds, soft, nontender. No abdominal bruits. Extremities no clubbing cyanosis or edema. Peripheral pulses are normal without bruits. Neurologic exam alert and oriented without any motor or sensory deficits.  Assessment:  Well visit    Health maint UTD     Plan:     During the course of the visit the patient was educated and counseled about appropriate screening and preventive services including:    Pneumococcal vaccine   Influenza vaccine  Prostate cancer screening  Diet review for nutrition referral? Yes ____  Not Indicated _x___   Patient Instructions (the written plan) was given to the patient.  Medicare Attestation I have personally reviewed: The patient's medical and social history Their use of alcohol, tobacco or illicit drugs Their current medications and supplements The patient's functional ability including ADLs,fall risks, home safety risks, cognitive, and hearing and visual impairment Diet and physical activities Evidence for depression or mood disorders  The patient's weight, height, BMI, and visual acuity have been recorded in the chart.  I have made referrals, counseling, and provided education to the patient based on review of the above and I have provided the patient with a written personalized care plan for preventive services.     Judie Petit, MD   05/02/2011

## 2011-05-02 NOTE — Assessment & Plan Note (Signed)
Lab Results  Component Value Date   CHOL 152 04/26/2010   HDL 33.90* 04/26/2010   LDLCALC 89 04/26/2010   LDLDIRECT 106.9 03/07/2009   TRIG 147.0 04/26/2010   CHOLHDL 4 04/26/2010  well controlled Check labs today

## 2011-06-21 DIAGNOSIS — H00029 Hordeolum internum unspecified eye, unspecified eyelid: Secondary | ICD-10-CM | POA: Diagnosis not present

## 2011-06-21 DIAGNOSIS — H18609 Keratoconus, unspecified, unspecified eye: Secondary | ICD-10-CM | POA: Diagnosis not present

## 2011-08-08 ENCOUNTER — Telehealth: Payer: Self-pay | Admitting: Internal Medicine

## 2011-08-08 MED ORDER — DIOVAN HCT 80-12.5 MG PO TABS: 1.0000 | ORAL_TABLET | Freq: Every day | ORAL | Status: DC

## 2011-08-08 NOTE — Telephone Encounter (Addendum)
Pt wanted to make sure he was not going to get dizzy with the generic diovan cause it listed as a side effect.  He wants to stay on name brand diovan but per Dr Lovell Sheehan insurance may not pay for it.  Ok per Dr Lovell Sheehan, rx sent in electronically to pharmacy, pt aware

## 2011-08-08 NOTE — Telephone Encounter (Signed)
Pt called and has questions re: Generic Diovan (Valsartan - HCTZ 80-12.5 mg). Pt said that he just rcvd a 90 day supply of Valsartan and pt is wanting to know if Dr Cato Mulligan thinks that pt may have adverse side effects to this med or not? Pt has never taking this generic med before and it was automatically sent to him by his mail order pharmacy.

## 2011-08-12 ENCOUNTER — Other Ambulatory Visit: Payer: Self-pay | Admitting: Internal Medicine

## 2011-09-07 ENCOUNTER — Ambulatory Visit (INDEPENDENT_AMBULATORY_CARE_PROVIDER_SITE_OTHER): Payer: Medicare Other | Admitting: Family Medicine

## 2011-09-07 ENCOUNTER — Encounter: Payer: Self-pay | Admitting: Family Medicine

## 2011-09-07 VITALS — BP 120/78 | HR 72 | Temp 98.3°F | Wt 173.0 lb

## 2011-09-07 DIAGNOSIS — L0292 Furuncle, unspecified: Secondary | ICD-10-CM

## 2011-09-07 DIAGNOSIS — L0293 Carbuncle, unspecified: Secondary | ICD-10-CM | POA: Diagnosis not present

## 2011-09-07 MED ORDER — DOXYCYCLINE HYCLATE 100 MG PO CAPS: 100.0000 mg | ORAL_CAPSULE | Freq: Two times a day (BID) | ORAL | Status: AC

## 2011-09-07 NOTE — Progress Notes (Signed)
  Subjective:    Patient ID: [name], male    DOB: [birth year], 71 y.o.   MRN: [id number]  HPI Here for 5 days of a tender lump in the middle of his back. No fevers or other symptoms.    Review of Systems  Constitutional: Negative.        Objective:   Physical Exam  Constitutional: He appears well-developed and well-nourished.  Skin:       Tender boil between the shoulder blades           Assessment & Plan:  This was lanced with a scalpel and a small amount of purulent material was extracted. A wound culture was obtained. Treat with Doxycycline.

## 2011-09-10 LAB — WOUND CULTURE: Gram Stain: NONE SEEN

## 2011-09-11 NOTE — Progress Notes (Signed)
Quick Note:  Pt informed ______ 

## 2011-09-27 DIAGNOSIS — M722 Plantar fascial fibromatosis: Secondary | ICD-10-CM | POA: Diagnosis not present

## 2011-10-02 ENCOUNTER — Encounter: Payer: Self-pay | Admitting: Family Medicine

## 2011-10-02 ENCOUNTER — Ambulatory Visit (INDEPENDENT_AMBULATORY_CARE_PROVIDER_SITE_OTHER): Payer: Medicare Other | Admitting: Family Medicine

## 2011-10-02 VITALS — BP 132/80 | HR 76 | Temp 98.5°F | Wt 175.0 lb

## 2011-10-02 DIAGNOSIS — M546 Pain in thoracic spine: Secondary | ICD-10-CM | POA: Diagnosis not present

## 2011-10-02 NOTE — Progress Notes (Signed)
  Subjective:    Patient ID: [name], male    DOB: 1940-10-08, 71 y.o.   MRN: [id number]  HPI Here for one week of intermittent burning pains in the left upper back just beneath the shoulder blade. No recent trauma. He was here 2 months ago to have an infected boil lanced on the back, and this healed up nicely.    Review of Systems  Constitutional: Negative.   Musculoskeletal: Positive for back pain.       Objective:   Physical Exam  Constitutional: He appears well-developed and well-nourished.  Musculoskeletal:       Mildly tender in the right upper back just beneath the scapula. Full ROM. The boil on the left lower back has resolved, and this is well removed from the point of currnt pain. No   Skin: No rash noted.          Assessment & Plan:  This seems to be a pinched nerve in the back. Rest, use heat and ibuprofen prn

## 2011-10-03 ENCOUNTER — Ambulatory Visit: Payer: Medicare Other | Admitting: Family Medicine

## 2011-10-11 DIAGNOSIS — H00019 Hordeolum externum unspecified eye, unspecified eyelid: Secondary | ICD-10-CM | POA: Diagnosis not present

## 2011-10-22 DIAGNOSIS — H52209 Unspecified astigmatism, unspecified eye: Secondary | ICD-10-CM | POA: Diagnosis not present

## 2011-10-22 DIAGNOSIS — H5210 Myopia, unspecified eye: Secondary | ICD-10-CM | POA: Diagnosis not present

## 2011-10-22 DIAGNOSIS — H0019 Chalazion unspecified eye, unspecified eyelid: Secondary | ICD-10-CM | POA: Diagnosis not present

## 2011-11-05 DIAGNOSIS — H905 Unspecified sensorineural hearing loss: Secondary | ICD-10-CM | POA: Diagnosis not present

## 2011-11-05 DIAGNOSIS — H903 Sensorineural hearing loss, bilateral: Secondary | ICD-10-CM | POA: Diagnosis not present

## 2012-02-25 ENCOUNTER — Other Ambulatory Visit: Payer: Self-pay | Admitting: Internal Medicine

## 2012-04-08 DIAGNOSIS — H903 Sensorineural hearing loss, bilateral: Secondary | ICD-10-CM | POA: Diagnosis not present

## 2012-04-08 DIAGNOSIS — H905 Unspecified sensorineural hearing loss: Secondary | ICD-10-CM | POA: Diagnosis not present

## 2012-06-09 ENCOUNTER — Ambulatory Visit: Payer: Medicare Other | Admitting: Internal Medicine

## 2012-06-13 DIAGNOSIS — Z23 Encounter for immunization: Secondary | ICD-10-CM | POA: Diagnosis not present

## 2012-07-17 DIAGNOSIS — H5210 Myopia, unspecified eye: Secondary | ICD-10-CM | POA: Diagnosis not present

## 2012-07-17 DIAGNOSIS — H25019 Cortical age-related cataract, unspecified eye: Secondary | ICD-10-CM | POA: Diagnosis not present

## 2012-09-13 ENCOUNTER — Other Ambulatory Visit: Payer: Self-pay | Admitting: Internal Medicine

## 2012-09-21 ENCOUNTER — Other Ambulatory Visit: Payer: Self-pay | Admitting: Internal Medicine

## 2012-09-29 ENCOUNTER — Other Ambulatory Visit: Payer: Self-pay | Admitting: Internal Medicine

## 2012-09-29 ENCOUNTER — Other Ambulatory Visit: Payer: Self-pay | Admitting: *Deleted

## 2012-09-29 MED ORDER — SIMVASTATIN 80 MG PO TABS: ORAL_TABLET | ORAL | Status: DC

## 2012-09-29 NOTE — Telephone Encounter (Signed)
Please refill as patient has scheduled an appointment and is out of meds.

## 2012-10-30 DIAGNOSIS — Z86010 Personal history of colonic polyps: Secondary | ICD-10-CM | POA: Diagnosis not present

## 2012-10-30 DIAGNOSIS — K648 Other hemorrhoids: Secondary | ICD-10-CM | POA: Diagnosis not present

## 2012-10-30 DIAGNOSIS — K5900 Constipation, unspecified: Secondary | ICD-10-CM | POA: Diagnosis not present

## 2012-10-30 DIAGNOSIS — K5730 Diverticulosis of large intestine without perforation or abscess without bleeding: Secondary | ICD-10-CM | POA: Diagnosis not present

## 2012-10-30 DIAGNOSIS — D126 Benign neoplasm of colon, unspecified: Secondary | ICD-10-CM | POA: Diagnosis not present

## 2012-11-12 ENCOUNTER — Encounter: Payer: Self-pay | Admitting: Internal Medicine

## 2012-11-12 ENCOUNTER — Ambulatory Visit (INDEPENDENT_AMBULATORY_CARE_PROVIDER_SITE_OTHER): Payer: Medicare Other | Admitting: Internal Medicine

## 2012-11-12 VITALS — BP 122/74 | HR 68 | Temp 98.0°F | Ht 66.0 in | Wt 176.0 lb

## 2012-11-12 DIAGNOSIS — I10 Essential (primary) hypertension: Secondary | ICD-10-CM

## 2012-11-12 DIAGNOSIS — E785 Hyperlipidemia, unspecified: Secondary | ICD-10-CM | POA: Diagnosis not present

## 2012-11-12 LAB — HEPATIC FUNCTION PANEL
Albumin: 4.3 g/dL (ref 3.5–5.2)
Alkaline Phosphatase: 43 U/L (ref 39–117)
Total Protein: 7.8 g/dL (ref 6.0–8.3)

## 2012-11-12 LAB — LIPID PANEL
Cholesterol: 151 mg/dL (ref 0–200)
LDL Cholesterol: 80 mg/dL (ref 0–99)
Total CHOL/HDL Ratio: 4
Triglycerides: 160 mg/dL — ABNORMAL HIGH (ref 0.0–149.0)
VLDL: 32 mg/dL (ref 0.0–40.0)

## 2012-11-12 LAB — BASIC METABOLIC PANEL
CO2: 29 mEq/L (ref 19–32)
Chloride: 99 mEq/L (ref 96–112)
Creatinine, Ser: 1 mg/dL (ref 0.4–1.5)
Potassium: 3.2 mEq/L — ABNORMAL LOW (ref 3.5–5.1)
Sodium: 139 mEq/L (ref 135–145)

## 2012-11-12 MED ORDER — SIMVASTATIN 80 MG PO TABS: ORAL_TABLET | ORAL | Status: DC

## 2012-11-12 MED ORDER — VALSARTAN-HYDROCHLOROTHIAZIDE 80-12.5 MG PO TABS: 1.0000 | ORAL_TABLET | Freq: Every day | ORAL | Status: DC

## 2012-11-14 NOTE — Assessment & Plan Note (Signed)
We'll check labs today. Continue simvastatin.

## 2012-11-14 NOTE — Assessment & Plan Note (Signed)
BP Readings from Last 3 Encounters:  11/12/12 122/74  10/02/11 132/80  09/07/11 120/78   Stable. Continue current medications.

## 2012-11-14 NOTE — Progress Notes (Signed)
Patient ID: [name], male   DOB: 20-Sep-1940, 72 y.o.   MRN: [id number] Patient comes in for followup. Patient has history of hypertension hyperlipidemia. He is feeling well. No specific complaints. He remains active.  Reviewed past medical history, family history, medications.  Review of systems: Unremarkable  Physical exam vital signs revealed. Chest clear to auscultation. Cardiac exam S1-S2 are regular. Abdominal exam active bowel sounds, soft. Extremities no edema.

## 2012-11-22 ENCOUNTER — Other Ambulatory Visit: Payer: Self-pay | Admitting: Internal Medicine

## 2012-11-25 DIAGNOSIS — H0019 Chalazion unspecified eye, unspecified eyelid: Secondary | ICD-10-CM | POA: Diagnosis not present

## 2012-12-04 DIAGNOSIS — H903 Sensorineural hearing loss, bilateral: Secondary | ICD-10-CM | POA: Diagnosis not present

## 2012-12-04 DIAGNOSIS — H8109 Meniere's disease, unspecified ear: Secondary | ICD-10-CM | POA: Diagnosis not present

## 2012-12-04 DIAGNOSIS — H905 Unspecified sensorineural hearing loss: Secondary | ICD-10-CM | POA: Diagnosis not present

## 2012-12-15 ENCOUNTER — Telehealth: Payer: Self-pay | Admitting: Internal Medicine

## 2012-12-15 NOTE — Telephone Encounter (Signed)
PT called to request a copy of his labs that where recently done. He would like to pick up those lab results this Wednesday 12/17/12, when he comes in for his repeat BMET. Please place orders for these labs. Thank you!

## 2012-12-15 NOTE — Telephone Encounter (Signed)
Labs up front for p/u

## 2012-12-17 ENCOUNTER — Other Ambulatory Visit (INDEPENDENT_AMBULATORY_CARE_PROVIDER_SITE_OTHER): Payer: Medicare Other

## 2012-12-17 ENCOUNTER — Encounter: Payer: Self-pay | Admitting: Internal Medicine

## 2012-12-17 DIAGNOSIS — I10 Essential (primary) hypertension: Secondary | ICD-10-CM | POA: Diagnosis not present

## 2012-12-17 LAB — BASIC METABOLIC PANEL
BUN: 17 mg/dL (ref 6–23)
CO2: 33 mEq/L — ABNORMAL HIGH (ref 19–32)
Chloride: 100 mEq/L (ref 96–112)
GFR: 75.58 mL/min (ref >60.00)
Glucose, Bld: 92 mg/dL (ref 70–99)
Potassium: 3.4 mEq/L — ABNORMAL LOW (ref 3.5–5.1)
Sodium: 139 mEq/L (ref 135–145)

## 2012-12-30 ENCOUNTER — Other Ambulatory Visit: Payer: Self-pay | Admitting: *Deleted

## 2012-12-30 MED ORDER — POTASSIUM CHLORIDE CRYS ER 20 MEQ PO TBCR: 20.0000 meq | EXTENDED_RELEASE_TABLET | Freq: Every day | ORAL | Status: DC

## 2012-12-31 DIAGNOSIS — H0019 Chalazion unspecified eye, unspecified eyelid: Secondary | ICD-10-CM | POA: Diagnosis not present

## 2013-01-08 DIAGNOSIS — D234 Other benign neoplasm of skin of scalp and neck: Secondary | ICD-10-CM | POA: Diagnosis not present

## 2013-01-08 DIAGNOSIS — L821 Other seborrheic keratosis: Secondary | ICD-10-CM | POA: Diagnosis not present

## 2013-01-08 DIAGNOSIS — L578 Other skin changes due to chronic exposure to nonionizing radiation: Secondary | ICD-10-CM | POA: Diagnosis not present

## 2013-01-15 DIAGNOSIS — H25019 Cortical age-related cataract, unspecified eye: Secondary | ICD-10-CM | POA: Diagnosis not present

## 2013-01-15 DIAGNOSIS — H18609 Keratoconus, unspecified, unspecified eye: Secondary | ICD-10-CM | POA: Diagnosis not present

## 2013-01-15 DIAGNOSIS — H52219 Irregular astigmatism, unspecified eye: Secondary | ICD-10-CM | POA: Diagnosis not present

## 2013-01-15 DIAGNOSIS — H0019 Chalazion unspecified eye, unspecified eyelid: Secondary | ICD-10-CM | POA: Diagnosis not present

## 2013-03-10 DIAGNOSIS — N281 Cyst of kidney, acquired: Secondary | ICD-10-CM | POA: Diagnosis not present

## 2013-03-10 DIAGNOSIS — N200 Calculus of kidney: Secondary | ICD-10-CM | POA: Diagnosis not present

## 2013-03-10 DIAGNOSIS — R310 Gross hematuria: Secondary | ICD-10-CM | POA: Diagnosis not present

## 2013-03-10 DIAGNOSIS — N401 Enlarged prostate with lower urinary tract symptoms: Secondary | ICD-10-CM | POA: Diagnosis not present

## 2013-04-14 DIAGNOSIS — H269 Unspecified cataract: Secondary | ICD-10-CM | POA: Diagnosis not present

## 2013-04-14 DIAGNOSIS — H25019 Cortical age-related cataract, unspecified eye: Secondary | ICD-10-CM | POA: Diagnosis not present

## 2013-04-14 DIAGNOSIS — H2510 Age-related nuclear cataract, unspecified eye: Secondary | ICD-10-CM | POA: Diagnosis not present

## 2013-04-14 DIAGNOSIS — H18609 Keratoconus, unspecified, unspecified eye: Secondary | ICD-10-CM | POA: Diagnosis not present

## 2013-04-14 DIAGNOSIS — H25039 Anterior subcapsular polar age-related cataract, unspecified eye: Secondary | ICD-10-CM | POA: Diagnosis not present

## 2013-05-21 HISTORY — PX: OTHER SURGICAL HISTORY: SHX169

## 2013-06-30 DIAGNOSIS — H903 Sensorineural hearing loss, bilateral: Secondary | ICD-10-CM | POA: Diagnosis not present

## 2013-06-30 DIAGNOSIS — H6120 Impacted cerumen, unspecified ear: Secondary | ICD-10-CM | POA: Diagnosis not present

## 2013-06-30 DIAGNOSIS — H905 Unspecified sensorineural hearing loss: Secondary | ICD-10-CM | POA: Diagnosis not present

## 2013-06-30 DIAGNOSIS — H8109 Meniere's disease, unspecified ear: Secondary | ICD-10-CM | POA: Diagnosis not present

## 2013-07-08 DIAGNOSIS — H259 Unspecified age-related cataract: Secondary | ICD-10-CM | POA: Diagnosis not present

## 2013-07-08 DIAGNOSIS — H43819 Vitreous degeneration, unspecified eye: Secondary | ICD-10-CM | POA: Diagnosis not present

## 2013-07-08 DIAGNOSIS — H18609 Keratoconus, unspecified, unspecified eye: Secondary | ICD-10-CM | POA: Diagnosis not present

## 2013-07-08 DIAGNOSIS — Z961 Presence of intraocular lens: Secondary | ICD-10-CM | POA: Diagnosis not present

## 2013-08-04 DIAGNOSIS — H18609 Keratoconus, unspecified, unspecified eye: Secondary | ICD-10-CM | POA: Diagnosis not present

## 2013-08-04 DIAGNOSIS — H269 Unspecified cataract: Secondary | ICD-10-CM | POA: Diagnosis not present

## 2013-08-04 DIAGNOSIS — H52219 Irregular astigmatism, unspecified eye: Secondary | ICD-10-CM | POA: Diagnosis not present

## 2013-08-04 DIAGNOSIS — H2510 Age-related nuclear cataract, unspecified eye: Secondary | ICD-10-CM | POA: Diagnosis not present

## 2013-08-04 DIAGNOSIS — H25019 Cortical age-related cataract, unspecified eye: Secondary | ICD-10-CM | POA: Diagnosis not present

## 2013-08-04 DIAGNOSIS — H25049 Posterior subcapsular polar age-related cataract, unspecified eye: Secondary | ICD-10-CM | POA: Diagnosis not present

## 2013-08-04 DIAGNOSIS — H4420 Degenerative myopia, unspecified eye: Secondary | ICD-10-CM | POA: Diagnosis not present

## 2013-08-04 DIAGNOSIS — H25039 Anterior subcapsular polar age-related cataract, unspecified eye: Secondary | ICD-10-CM | POA: Diagnosis not present

## 2013-08-05 DIAGNOSIS — S6390XA Sprain of unspecified part of unspecified wrist and hand, initial encounter: Secondary | ICD-10-CM | POA: Diagnosis not present

## 2013-08-05 DIAGNOSIS — M79609 Pain in unspecified limb: Secondary | ICD-10-CM | POA: Diagnosis not present

## 2013-08-05 DIAGNOSIS — M65849 Other synovitis and tenosynovitis, unspecified hand: Secondary | ICD-10-CM | POA: Diagnosis not present

## 2013-08-05 DIAGNOSIS — M65839 Other synovitis and tenosynovitis, unspecified forearm: Secondary | ICD-10-CM | POA: Diagnosis not present

## 2013-10-05 ENCOUNTER — Encounter: Payer: Self-pay | Admitting: Internal Medicine

## 2013-10-05 ENCOUNTER — Ambulatory Visit: Payer: Medicare Other | Admitting: Internal Medicine

## 2013-10-05 ENCOUNTER — Ambulatory Visit (INDEPENDENT_AMBULATORY_CARE_PROVIDER_SITE_OTHER): Payer: Medicare Other | Admitting: Internal Medicine

## 2013-10-05 VITALS — BP 120/84 | HR 63 | Temp 97.6°F | Wt 176.0 lb

## 2013-10-05 DIAGNOSIS — Z23 Encounter for immunization: Secondary | ICD-10-CM

## 2013-10-05 DIAGNOSIS — R5383 Other fatigue: Secondary | ICD-10-CM | POA: Diagnosis not present

## 2013-10-05 DIAGNOSIS — R5381 Other malaise: Secondary | ICD-10-CM | POA: Diagnosis not present

## 2013-10-05 DIAGNOSIS — E785 Hyperlipidemia, unspecified: Secondary | ICD-10-CM

## 2013-10-05 DIAGNOSIS — I10 Essential (primary) hypertension: Secondary | ICD-10-CM | POA: Diagnosis not present

## 2013-10-05 LAB — CBC WITH DIFFERENTIAL/PLATELET
Basophils Absolute: 0 10*3/uL (ref 0.0–0.1)
Basophils Relative: 0.5 % (ref 0.0–3.0)
Eosinophils Absolute: 0.2 10*3/uL (ref 0.0–0.7)
Eosinophils Relative: 2.4 % (ref 0.0–5.0)
HCT: 41 % (ref 39.0–52.0)
Hemoglobin: 13.7 g/dL (ref 13.0–17.0)
LYMPHS PCT: 27.3 % (ref 12.0–46.0)
Lymphs Abs: 2.2 10*3/uL (ref 0.7–4.0)
MCHC: 33.5 g/dL (ref 30.0–36.0)
MCV: 93.5 fl (ref 78.0–100.0)
MONOS PCT: 5.5 % (ref 3.0–12.0)
Monocytes Absolute: 0.4 10*3/uL (ref 0.1–1.0)
Neutro Abs: 5.1 10*3/uL (ref 1.4–7.7)
Neutrophils Relative %: 64.3 % (ref 43.0–77.0)
PLATELETS: 205 10*3/uL (ref 150.0–400.0)
RBC: 4.38 Mil/uL (ref 4.22–5.81)
RDW: 13.4 % (ref 11.5–15.5)
WBC: 8 10*3/uL (ref 4.0–10.5)

## 2013-10-05 LAB — HEPATIC FUNCTION PANEL
ALBUMIN: 4.2 g/dL (ref 3.5–5.2)
ALK PHOS: 44 U/L (ref 39–117)
ALT: 34 U/L (ref 0–53)
AST: 29 U/L (ref 0–37)
BILIRUBIN TOTAL: 1 mg/dL (ref 0.2–1.2)
Bilirubin, Direct: 0.1 mg/dL (ref 0.0–0.3)
Total Protein: 7.3 g/dL (ref 6.0–8.3)

## 2013-10-05 LAB — LIPID PANEL
CHOLESTEROL: 144 mg/dL (ref 0–200)
HDL: 37.3 mg/dL — ABNORMAL LOW (ref >39.00)
LDL CALC: 73 mg/dL (ref 0–99)
Total CHOL/HDL Ratio: 4
Triglycerides: 171 mg/dL — ABNORMAL HIGH (ref 0.0–149.0)
VLDL: 34.2 mg/dL (ref 0.0–40.0)

## 2013-10-05 LAB — BASIC METABOLIC PANEL
BUN: 20 mg/dL (ref 6–23)
CALCIUM: 9.4 mg/dL (ref 8.4–10.5)
CO2: 29 meq/L (ref 19–32)
CREATININE: 1 mg/dL (ref 0.4–1.5)
Chloride: 101 mEq/L (ref 96–112)
GFR: 78.93 mL/min (ref >60.00)
GLUCOSE: 90 mg/dL (ref 70–99)
Potassium: 3.5 mEq/L (ref 3.5–5.1)
Sodium: 140 mEq/L (ref 135–145)

## 2013-10-05 LAB — TSH: TSH: 0.78 u[IU]/mL (ref 0.35–4.50)

## 2013-10-05 LAB — SEDIMENTATION RATE: SED RATE: 2 mm/h (ref 0–22)

## 2013-10-05 MED ORDER — VALSARTAN 80 MG PO TABS: 80.0000 mg | ORAL_TABLET | Freq: Every day | ORAL | Status: DC

## 2013-10-05 NOTE — Progress Notes (Signed)
C/o fatigue- says he feels tired by noon. Sometimes he is sleepy. Says that he "wears out" when he works outside. He denies CP, SOB, pnd.  States if he takes a nap he feels better.   Past Medical History  Diagnosis Date  . Colon polyps   . HLD (hyperlipidemia)   . History of nephrolithiasis   . Carotid artery plaque     mild  . HTN (hypertension)     History   Social History  . Marital Status: Married    Spouse Name: N/A    Number of Children: N/A  . Years of Education: N/A   Occupational History  . Not on file.   Social History Main Topics  . Smoking status: Never Smoker   . Smokeless tobacco: Never Used  . Alcohol Use: No  . Drug Use: No  . Sexual Activity: Not on file   Other Topics Concern  . Not on file   Social History Narrative   Married      No regular exercise    Past Surgical History  Procedure Laterality Date  . Laparoscopic drainage renal cyst    . Lithotripsy    . Hemorrhoid surgery    . Inguinal hernia repair      Family History  Problem Relation Age of Onset  . Colon cancer Father   . Heart attack Mother 57  . Diabetes      1st degree relative    No Known Allergies  Current Outpatient Prescriptions on File Prior to Visit  Medication Sig Dispense Refill  . aspirin 81 MG tablet Take 81 mg by mouth daily.        . potassium chloride SA (K-DUR,KLOR-CON) 20 MEQ tablet Take 1 tablet (20 mEq total) by mouth daily.  90 tablet  3  . simvastatin (ZOCOR) 80 MG tablet TAKE 1/2 TABLET BY MOUTH AT BEDTIME  45 tablet  3  . valsartan-hydrochlorothiazide (DIOVAN-HCT) 80-12.5 MG per tablet TAKE 1 TABLET BY MOUTH EVERY DAY  90 tablet  1   No current facility-administered medications on file prior to visit.     patient denies chest pain, shortness of breath, orthopnea. Denies lower extremity edema, abdominal pain, change in appetite, change in bowel movements. Patient denies rashes, musculoskeletal complaints. No other specific complaints in a complete  review of systems.   BP 120/84  Pulse 63  Temp(Src) 97.6 F (36.4 C) (Oral)  Wt 176 lb (79.833 kg)  SpO2 97%  well-developed well-nourished male in no acute distress. HEENT exam atraumatic, normocephalic, neck supple without jugular venous distention. Chest clear to auscultation cardiac exam S1-S2 are regular. Abdominal exam overweight with bowel sounds, soft and nontender. Extremities no edema. Neurologic exam is alert with a normal gait.  HYPERTENSION Controlled He has trouble taking potassium i think he will do fine on valsartan alone Stop potassium and hctz  HYPERLIPIDEMIA Check labs today    fatigue- check labs today

## 2013-10-05 NOTE — Progress Notes (Signed)
Pre visit review using our clinic review tool, if applicable. No additional management support is needed unless otherwise documented below in the visit note.,  

## 2013-10-05 NOTE — Assessment & Plan Note (Signed)
Controlled He has trouble taking potassium i think he will do fine on valsartan alone Stop potassium and hctz

## 2013-10-05 NOTE — Assessment & Plan Note (Signed)
Check labs today.

## 2013-10-06 ENCOUNTER — Telehealth: Payer: Self-pay | Admitting: Internal Medicine

## 2013-10-06 NOTE — Telephone Encounter (Signed)
Relevant patient education mailed to patient.  

## 2013-11-17 ENCOUNTER — Encounter (INDEPENDENT_AMBULATORY_CARE_PROVIDER_SITE_OTHER): Payer: Self-pay | Admitting: General Surgery

## 2013-11-17 ENCOUNTER — Ambulatory Visit (INDEPENDENT_AMBULATORY_CARE_PROVIDER_SITE_OTHER): Payer: Medicare Other | Admitting: General Surgery

## 2013-11-17 VITALS — BP 142/80 | HR 72 | Resp 18 | Ht 67.0 in | Wt 177.0 lb

## 2013-11-17 DIAGNOSIS — IMO0002 Reserved for concepts with insufficient information to code with codable children: Secondary | ICD-10-CM | POA: Diagnosis not present

## 2013-11-17 DIAGNOSIS — S76219A Strain of adductor muscle, fascia and tendon of unspecified thigh, initial encounter: Secondary | ICD-10-CM | POA: Insufficient documentation

## 2013-11-17 NOTE — Progress Notes (Signed)
Patient ID: [name], male   DOB: 01-Nov-1940, 73 y.o.   MRN: [id number]  No chief complaint on file.   HPI [name] is a 73 y.o. male.   HPI  Further evaluation and treatment of right groin pain. 10 years ago, he had a laparoscopic right inguinal hernia repair with mesh.   7 weeks ago he noted some right groin discomfort that has persisted. It is limited to the right groin. He does do some intermittent lifting of clay and 5 gallon jugs of fluid.  No difficulty with urination or constipation.  He has not noticed any swelling in the area.  Past Medical History  Diagnosis Date  . Colon polyps   . HLD (hyperlipidemia)   . History of nephrolithiasis   . Carotid artery plaque     mild  . HTN (hypertension)     Past Surgical History  Procedure Laterality Date  . Laparoscopic drainage renal cyst    . Lithotripsy    . Hemorrhoid surgery    . Inguinal hernia repair    . Cataract Bilateral 2015    Family History  Problem Relation Age of Onset  . Colon cancer Father   . Heart attack Mother 35  . Diabetes      1st degree relative    Social History History  Substance Use Topics  . Smoking status: Never Smoker   . Smokeless tobacco: Never Used  . Alcohol Use: No    No Known Allergies  Current Outpatient Prescriptions  Medication Sig Dispense Refill  . aspirin 81 MG tablet Take 81 mg by mouth daily.        . simvastatin (ZOCOR) 80 MG tablet TAKE 1/2 TABLET BY MOUTH AT BEDTIME  45 tablet  3  . valsartan (DIOVAN) 80 MG tablet Take 1 tablet (80 mg total) by mouth daily.  90 tablet  3   No current facility-administered medications for this visit.    Review of Systems Review of Systems  Constitutional: Negative.   HENT: Positive for hearing loss.   Respiratory: Negative.   Cardiovascular: Negative.   Gastrointestinal: Negative.   Genitourinary: Negative.   Musculoskeletal: Positive for arthralgias.    Blood pressure 142/80, pulse 72, resp. rate 18, height 5\' 7"   (1.702 m), weight 177 lb (80.287 kg).  Physical Exam Physical Exam  Constitutional: He appears well-developed and well-nourished. No distress.  HENT:  Head: Normocephalic and atraumatic.  Abdominal: Soft.  A small subumbilical scars.  Genitourinary:  Right inguinal floor it is solid without bulges at rest and with Valsalva maneuvers. Left inguinal floor is solid.  Neurological: He is alert.  Skin: Skin is warm and dry.  Psychiatric: He has a normal mood and affect. His behavior is normal.    Data Reviewed none  Assessment    Right groin strain. No evidence of recurrent hernia.     Plan    Moist heat to the area. Nonsteroidal as needed. Light activity for 6 weeks. Return prn.        ROSENBOWER,TODD J 11/17/2013, 4:19 PM

## 2013-11-17 NOTE — Patient Instructions (Signed)
I feel you have a right groin strain. Light activities for 6 weeks.  Moist heat to area.  Aleve or Advil as needed for discomfort.

## 2013-11-26 ENCOUNTER — Ambulatory Visit (INDEPENDENT_AMBULATORY_CARE_PROVIDER_SITE_OTHER): Payer: Medicare Other | Admitting: Physician Assistant

## 2013-11-26 ENCOUNTER — Encounter: Payer: Self-pay | Admitting: Physician Assistant

## 2013-11-26 ENCOUNTER — Ambulatory Visit (INDEPENDENT_AMBULATORY_CARE_PROVIDER_SITE_OTHER)
Admission: RE | Admit: 2013-11-26 | Discharge: 2013-11-26 | Disposition: A | Discharge status: Home / Self Care | Payer: Medicare Other | Source: Ambulatory Visit | Attending: Physician Assistant | Admitting: Physician Assistant

## 2013-11-26 VITALS — BP 130/80 | HR 73 | Temp 98.0°F | Resp 18 | Wt 177.0 lb

## 2013-11-26 DIAGNOSIS — J069 Acute upper respiratory infection, unspecified: Secondary | ICD-10-CM

## 2013-11-26 DIAGNOSIS — R0989 Other specified symptoms and signs involving the circulatory and respiratory systems: Secondary | ICD-10-CM | POA: Diagnosis not present

## 2013-11-26 NOTE — Patient Instructions (Signed)
You have a chest x-ray done at the [facility] office today. We will call you with the results of this when it is available.  Continue using over-the-counter Tylenol as needed for fever symptoms or aches and pains.  Plain Over the Counter Mucinex (NOT Mucinex D), mucinex DM is okay, for thick secretions  Force NON dairy fluids, drinking plenty of water is best.    Over the Counter Flonase OR Nasacort AQ 1 spray in each nostril twice a day as needed. Use the "crossover" technique into opposite nostril spraying toward opposite ear @ 45 degree angle, not straight up into nostril.   Plain Over the Counter Allegra (NOT D )  160 daily , OR Loratidine 10 mg , OR Zyrtec 10 mg @ bedtime  as needed for itchy eyes & sneezing.  Saline Irrigation and Saline Sprays can also help reduce symptoms.  If emergency symptoms discussed during visit developed, seek medical attention immediately.  Followup as needed, or for worsening or persistent symptoms despite treatment.    Upper Respiratory Infection, Adult An upper respiratory infection (URI) is also known as the common cold. It is often caused by a type of germ (virus). Colds are easily spread (contagious). You can pass it to others by kissing, coughing, sneezing, or drinking out of the same glass. Usually, you get better in 1 or 2 weeks.  HOME CARE   Only take medicine as told by your doctor.  Use a warm mist humidifier or breathe in steam from a hot shower.  Drink enough water and fluids to keep your pee (urine) clear or pale yellow.  Get plenty of rest.  Return to work when your temperature is back to normal or as told by your doctor. You may use a face mask and wash your hands to stop your cold from spreading. GET HELP RIGHT AWAY IF:   After the first few days, you feel you are getting worse.  You have questions about your medicine.  You have chills, shortness of breath, or brown or red spit (mucus).  You have yellow or brown snot (nasal  discharge) or pain in the face, especially when you bend forward.  You have a fever, puffy (swollen) neck, pain when you swallow, or white spots in the back of your throat.  You have a bad headache, ear pain, sinus pain, or chest pain.  You have a high-pitched whistling sound when you breathe in and out (wheezing).  You have a lasting cough or cough up blood.  You have sore muscles or a stiff neck. MAKE SURE YOU:   Understand these instructions.  Will watch your condition.  Will get help right away if you are not doing well or get worse. Document Released: 10/24/2007 Document Revised: 07/30/2011 Document Reviewed: 09/11/2010 [facility] Patient Information 2015 [place], [place]. This information is not intended to replace advice given to you by your health care provider. Make sure you discuss any questions you have with your health care provider.

## 2013-11-26 NOTE — Progress Notes (Signed)
Pre visit review using our clinic review tool, if applicable. No additional management support is needed unless otherwise documented below in the visit note. 

## 2013-11-26 NOTE — Progress Notes (Signed)
Subjective:    Patient ID: [name], male    DOB: Apr 21, 1941, 73 y.o.   MRN: [id number]  Cough This is a new problem. The current episode started in the past 7 days (5 days). The problem has been gradually worsening. The problem occurs hourly. The cough is non-productive. Associated symptoms include a fever, nasal congestion and a sore throat. Pertinent negatives include no chest pain, chills, ear congestion, ear pain, headaches, heartburn, hemoptysis, myalgias, postnasal drip, rash, rhinorrhea, shortness of breath, sweats, weight loss or wheezing. Nothing aggravates the symptoms. Treatments tried: mucinex DM, tylenol. The treatment provided mild relief. There is no history of asthma, COPD or environmental allergies.      Review of Systems  Constitutional: Positive for fever and fatigue. Negative for chills and weight loss.  HENT: Positive for congestion and sore throat. Negative for ear discharge, ear pain, postnasal drip, rhinorrhea and sinus pressure.   Respiratory: Positive for cough. Negative for hemoptysis, shortness of breath and wheezing.   Cardiovascular: Negative for chest pain.  Gastrointestinal: Negative for heartburn, nausea, vomiting and diarrhea.  Musculoskeletal: Negative for myalgias.  Skin: Negative for rash.  Allergic/Immunologic: Negative for environmental allergies.  Neurological: Negative for headaches.  All other systems reviewed and are negative.     Past Medical History  Diagnosis Date  . Colon polyps   . HLD (hyperlipidemia)   . History of nephrolithiasis   . Carotid artery plaque     mild  . HTN (hypertension)     History   Social History  . Marital Status: Married    Spouse Name: N/A    Number of Children: N/A  . Years of Education: N/A   Occupational History  . Not on file.   Social History Main Topics  . Smoking status: Never Smoker   . Smokeless tobacco: Never Used  . Alcohol Use: No  . Drug Use: No  . Sexual Activity: Not on  file   Other Topics Concern  . Not on file   Social History Narrative   Married      No regular exercise    Past Surgical History  Procedure Laterality Date  . Laparoscopic drainage renal cyst    . Lithotripsy    . Hemorrhoid surgery    . Inguinal hernia repair    . Cataract Bilateral 2015    Family History  Problem Relation Age of Onset  . Colon cancer Father   . Heart attack Mother 49  . Diabetes      1st degree relative    No Known Allergies  Current Outpatient Prescriptions on File Prior to Visit  Medication Sig Dispense Refill  . aspirin 81 MG tablet Take 81 mg by mouth daily.        . simvastatin (ZOCOR) 80 MG tablet TAKE 1/2 TABLET BY MOUTH AT BEDTIME  45 tablet  3  . valsartan (DIOVAN) 80 MG tablet Take 1 tablet (80 mg total) by mouth daily.  90 tablet  3   No current facility-administered medications on file prior to visit.    EXAM: BP 130/80  Pulse 73  Temp(Src) 98 F (36.7 C) (Oral)  Resp 18  Wt 177 lb (80.287 kg)  SpO2 98%     Objective:   Physical Exam  Nursing note and vitals reviewed. Constitutional: He is oriented to person, place, and time. He appears well-developed and well-nourished. No distress.  HENT:  Head: Normocephalic and atraumatic.  Right Ear: External ear normal.  Left Ear: External  ear normal.  Nose: Nose normal.  Mouth/Throat: No oropharyngeal exudate.  Oropharynx is slightly erythematous, no exudate. Bilateral TMs normal. Bilateral frontal and maxillary sinuses non-TTP.  Eyes: Conjunctivae and EOM are normal. Pupils are equal, round, and reactive to light.  Neck: Normal range of motion. Neck supple. No JVD present.  Cardiovascular: Normal rate, regular rhythm and intact distal pulses.   Pulmonary/Chest: Effort normal and breath sounds normal. No stridor. No respiratory distress. He has no wheezes. He has no rales. He exhibits no tenderness.  Musculoskeletal: Normal range of motion. He exhibits no edema.    Lymphadenopathy:    He has no cervical adenopathy.  Neurological: He is alert and oriented to person, place, and time.  Skin: Skin is warm and dry. No rash noted. He is not diaphoretic. No erythema. No pallor.  Psychiatric: He has a normal mood and affect. His behavior is normal. Judgment and thought content normal.    Lab Results  Component Value Date   WBC 8.0 10/05/2013   HGB 13.7 10/05/2013   HCT 41.0 10/05/2013   PLT 205.0 10/05/2013   GLUCOSE 90 10/05/2013   CHOL 144 10/05/2013   TRIG 171.0* 10/05/2013   HDL 37.30* 10/05/2013   LDLDIRECT 106.9 03/07/2009   LDLCALC 73 10/05/2013   ALT 34 10/05/2013   AST 29 10/05/2013   NA 140 10/05/2013   K 3.5 10/05/2013   CL 101 10/05/2013   CREATININE 1.0 10/05/2013   BUN 20 10/05/2013   CO2 29 10/05/2013   TSH 0.78 10/05/2013   PSA 1.53 05/02/2011         Assessment & Plan:  [name] was seen today for uri.  Diagnoses and associated orders for this visit:  Acute upper respiratory infections of unspecified site Comments: Will continue Mucinex and tylenol for fever/aches, add nasal steroid and push fluids and rest. - DG Chest 2 View; Future   Will obtain CXR for to rule out acute process.   Return precautions provided, and patient handout on URI.  Plan to follow up as needed, or for worsening or persistent symptoms despite treatment.  Patient Instructions  You have a chest x-ray done at the [place] office today. We will call you with the results of this when it is available.  Continue using over-the-counter Tylenol as needed for fever symptoms or aches and pains.  Plain Over the Counter Mucinex (NOT Mucinex D), mucinex DM is okay, for thick secretions  Force NON dairy fluids, drinking plenty of water is best.    Over the Counter Flonase OR Nasacort AQ 1 spray in each nostril twice a day as needed. Use the "crossover" technique into opposite nostril spraying toward opposite ear @ 45 degree angle, not straight up into nostril.   Plain  Over the Counter Allegra (NOT D )  160 daily , OR Loratidine 10 mg , OR Zyrtec 10 mg @ bedtime  as needed for itchy eyes & sneezing.  Saline Irrigation and Saline Sprays can also help reduce symptoms.  If emergency symptoms discussed during visit developed, seek medical attention immediately.  Followup as needed, or for worsening or persistent symptoms despite treatment.

## 2013-12-03 DIAGNOSIS — M1610 Unilateral primary osteoarthritis, unspecified hip: Secondary | ICD-10-CM | POA: Diagnosis not present

## 2013-12-16 ENCOUNTER — Other Ambulatory Visit: Payer: Self-pay | Admitting: Internal Medicine

## 2014-01-12 ENCOUNTER — Telehealth (INDEPENDENT_AMBULATORY_CARE_PROVIDER_SITE_OTHER): Payer: Self-pay

## 2014-01-12 ENCOUNTER — Other Ambulatory Visit (INDEPENDENT_AMBULATORY_CARE_PROVIDER_SITE_OTHER): Payer: Self-pay

## 2014-01-12 MED ORDER — MELOXICAM 7.5 MG PO TABS: 7.5000 mg | ORAL_TABLET | Freq: Every day | ORAL | Status: DC

## 2014-01-12 NOTE — Telephone Encounter (Signed)
May prescribe Meloxicam 7.5 mg po qday, disp #30, refill 0.  If he still has pain after this treatment, will need follow up appt.

## 2014-01-12 NOTE — Telephone Encounter (Addendum)
Pt was seen 11/17/13 by Dr. Zella Richer for groin pain.  He was diagnosed with a groin strain.  He has been following Dr. Bertrum Sol instructions for moist heat and rest.  He says the Ibuprofen does not help. His wife was prescribed Meloxicam for an injury, and he said he has tried 1-2 weekly and it really helps.  Would Dr. Zella Richer agree to prescribe.  Please advise

## 2014-01-12 NOTE — Telephone Encounter (Signed)
Dr. Zella Richer approved Meloxicam 7.5 mg 1 po qd #30 w/ no RF.  Faxed to pt's pharmacy, [facility].  If pain persists pt will need to be seen in clinic.

## 2014-03-15 DIAGNOSIS — H52203 Unspecified astigmatism, bilateral: Secondary | ICD-10-CM | POA: Diagnosis not present

## 2014-03-15 DIAGNOSIS — H18603 Keratoconus, unspecified, bilateral: Secondary | ICD-10-CM | POA: Diagnosis not present

## 2014-03-15 DIAGNOSIS — Z961 Presence of intraocular lens: Secondary | ICD-10-CM | POA: Diagnosis not present

## 2014-03-26 ENCOUNTER — Telehealth: Payer: Self-pay | Admitting: Family Medicine

## 2014-03-26 NOTE — Telephone Encounter (Signed)
Message handled

## 2014-03-26 NOTE — Telephone Encounter (Signed)
Patient Information:  Caller Name: [name]  Phone: [phone]  Patient: [name], [name]  Gender: Male  DOB: [birth year]  Age: 73 Years  PCP: Garret Reddish  Office Follow Up:  Does the office need to follow up with this patient?: Yes  Instructions For The Office: Wife is requesting call back regarding visit  RN Note:  Wife is calling regarding [name] who c/o fatigue and increase in blood pressure.  Wife states she spoke with someone today trying to get spouse in for visit.  States she called between 12:00 and 12:15.  Calling now to follow up requesting visit.  B/P 167/95.  Wife is requesting a call back to spouse with is 15 minutes from office.  Patient can be reached at [phone].  Symptoms  Reason For Call & Symptoms: elevated blood pressure fatigue  Reviewed Health History In EMR: Yes  Reviewed Medications In EMR: Yes  Reviewed Allergies In EMR: Yes  Reviewed Surgeries / Procedures: Yes  Date of Onset of Symptoms: 03/26/2014  Guideline(s) Used:  High Blood Pressure  Disposition Per Guideline:   See Today in Office  Reason For Disposition Reached:   Patient wants to be seen  Advice Given:  Call Back If:  You become worse.  Patient Will Follow Care Advice:  YES

## 2014-03-26 NOTE — Telephone Encounter (Signed)
Spoke with pt and offered appt at [facility] for tomorrow morning to evaluate BP. Pt accepted. Also advised pt that he will need to get an appt scheduled to establish with a provider that is accepting new pts at this location or at an office of his choosing. Pt verbalized understanding. Call sent to [facility] to schedule Saturday clinic

## 2014-03-26 NOTE — Telephone Encounter (Signed)
Was informed to FWD message back to [facility]

## 2014-03-27 ENCOUNTER — Ambulatory Visit (INDEPENDENT_AMBULATORY_CARE_PROVIDER_SITE_OTHER): Payer: Medicare Other | Admitting: Family Medicine

## 2014-03-27 ENCOUNTER — Encounter: Payer: Self-pay | Admitting: Family Medicine

## 2014-03-27 VITALS — BP 148/82 | Temp 98.3°F | Wt 179.0 lb

## 2014-03-27 DIAGNOSIS — I10 Essential (primary) hypertension: Secondary | ICD-10-CM | POA: Diagnosis not present

## 2014-03-27 MED ORDER — VALSARTAN-HYDROCHLOROTHIAZIDE 80-12.5 MG PO TABS: ORAL_TABLET | ORAL | Status: DC

## 2014-03-27 NOTE — Progress Notes (Signed)
   [facility] Primary Care On-Call Saturday Clinic [place], [place] Phone: [phone]  Patient ID: [name] MRN: [id number], DOB: 11/24/1940, 73 y.o. Date of Encounter: 03/27/2014  Primary Physician:  Chancy Hurter, MD   Chief Complaint: BP  Subjective:   History of Present Illness:  [name] is a 73 y.o. pleasant patient who presents with the following:  Pleasant gentleman who is here today for a blood pressure check.  His retiring primary care provider recently saw him in May 2015, and at that point he discontinued his hydrochlorothiazide.  His blood pressure has increased somewhat since then.  Also recently he has been having some significant hip pain, and has some significant right-sided degenerative joint disease.  He initially started taking some meloxicam, but he is only been taking this every few days.  He has had some headaches when his blood pressure has been high.  It is generally been at least in the 140s to 150s.  He has had some numbers as high as in the 160s and 170s.  The PMH, PSH, Social History, Family History, Medications, and allergies have been reviewed in [facility], and have been updated if relevant.  Review of Systems:  GEN: No acute illnesses, no fevers, chills. GI: No n/v/d, eating normally Pulm: No SOB Interactive and getting along well at home.  Otherwise, ROS is as per the HPI.  Objective:   Physical Examination: BP 148/82 mmHg  Temp(Src) 98.3 F (36.8 C)  Wt 179 lb (81.194 kg)   GEN: WDWN, NAD, Non-toxic, A & O x 3 HEENT: Atraumatic, Normocephalic. Neck supple. No masses, No LAD. Ears and Nose: No external deformity. CV: RRR, No M/G/R. No JVD. No thrill. No extra heart sounds. PULM: CTA B, no wheezes, crackles, rhonchi. No retractions. No resp. distress. No accessory muscle use. EXTR: No c/c/e NEURO Normal gait.  PSYCH: Normally interactive. Conversant. Not depressed or anxious appearing.   Calm demeanor.   Laboratory and Imaging Data:  Assessment & Plan:   Essential hypertension unstable Discontinue the patient's Diovan.  Restart the patient at Diovan 80/12.5 with hydrochlorothiazide, and he was completely normotensive when he was on this for an extended period of time for the patient.  Recommend that he follow-up in 3 or 4 weeks at his regular office, where they can repeat a BMP and check his potassium.  He did feel somewhat off when he started potassium previously   Signed,  Jalila Goodnough T. Gaston Dase, MD   Patient's Medications  New Prescriptions   No medications on file  Previous Medications   ASPIRIN 81 MG TABLET    Take 81 mg by mouth daily.     MELOXICAM (MOBIC) 7.5 MG TABLET    Take 1 tablet (7.5 mg total) by mouth daily.   SIMVASTATIN (ZOCOR) 80 MG TABLET    TAKE 1/2 TABLET BY MOUTH AT BEDTIME  Modified Medications   Modified Medication Previous Medication   VALSARTAN-HYDROCHLOROTHIAZIDE (DIOVAN-HCT) 80-12.5 MG PER TABLET valsartan-hydrochlorothiazide (DIOVAN-HCT) 80-12.5 MG per tablet      TAKE 1 TABLET EVERY DAY    TAKE 1 TABLET EVERY DAY  Discontinued Medications   VALSARTAN (DIOVAN) 80 MG TABLET    Take 1 tablet (80 mg total) by mouth daily.

## 2014-06-07 ENCOUNTER — Ambulatory Visit: Payer: Medicare Other | Admitting: Family Medicine

## 2014-06-08 DIAGNOSIS — M25511 Pain in right shoulder: Secondary | ICD-10-CM | POA: Diagnosis not present

## 2014-06-08 DIAGNOSIS — M7541 Impingement syndrome of right shoulder: Secondary | ICD-10-CM | POA: Diagnosis not present

## 2014-06-22 ENCOUNTER — Encounter: Payer: Self-pay | Admitting: Podiatry

## 2014-06-22 ENCOUNTER — Ambulatory Visit (INDEPENDENT_AMBULATORY_CARE_PROVIDER_SITE_OTHER): Payer: Medicare Other | Admitting: Podiatry

## 2014-06-22 VITALS — BP 126/74 | HR 81 | Resp 16

## 2014-06-22 DIAGNOSIS — B351 Tinea unguium: Secondary | ICD-10-CM

## 2014-06-22 DIAGNOSIS — L600 Ingrowing nail: Secondary | ICD-10-CM

## 2014-06-22 NOTE — Progress Notes (Signed)
Subjective:     Patient ID: [name], male   DOB: 01-26-1941, 74 y.o.   MRN: [id number]  HPI patient presents with a painful nail second right that's thick and impossible for him to cut. Patient states that it's sore with shoe gear and gradually becoming more of a nuisance for him and does not remember specific injury but it's been present for a long time   Review of Systems  All other systems reviewed and are negative.      Objective:   Physical Exam  Constitutional: He is oriented to person, place, and time.  Cardiovascular: Intact distal pulses.   Musculoskeletal: Normal range of motion.  Neurological: He is oriented to person, place, and time.  Skin: Skin is warm.  Nursing note and vitals reviewed.  neurovascular status intact with muscle strength adequate and range of motion of the subtalar and midtarsal joint within normal limits. Patient is noted to have good structural integrity of the foot digits that are well perfused and the patient is well oriented 3. Patient is noted to have a damaged painful second nail right that he cannot cut and is painful when pressed from a dorsal direction     Assessment:     Damaged second nail right with deformity    Plan:     H&P and condition discussed and removal of nail explained with possibilities of risk discussed. Patient wants procedure and today I infiltrated the right second toe 60 mg Xylocaine Marcaine mixture remove the second nail exposed the matrix and applied phenol for applications 30 seconds followed by alcohol lavaged and sterile dressing. Instructed on soaks and reappoint

## 2014-06-22 NOTE — Patient Instructions (Addendum)

## 2014-06-22 NOTE — Progress Notes (Signed)
   Subjective:    Patient ID: [name], male    DOB: Feb 05, 1941, 74 y.o.   MRN: [id number]  HPI Comments: "Its this toenail"  Patient c/o thick, discolored nail 2nd toe right for several months. He has tried trimming and unable to cut. Get sore occasionally.     Review of Systems  HENT: Positive for hearing loss.   Musculoskeletal: Positive for arthralgias.  All other systems reviewed and are negative.      Objective:   Physical Exam        Assessment & Plan:

## 2014-07-04 ENCOUNTER — Other Ambulatory Visit: Payer: Self-pay | Admitting: Family Medicine

## 2014-07-09 ENCOUNTER — Other Ambulatory Visit: Payer: Self-pay | Admitting: Family Medicine

## 2014-07-12 DIAGNOSIS — E785 Hyperlipidemia, unspecified: Secondary | ICD-10-CM | POA: Diagnosis not present

## 2014-07-12 DIAGNOSIS — E559 Vitamin D deficiency, unspecified: Secondary | ICD-10-CM | POA: Diagnosis not present

## 2014-07-12 DIAGNOSIS — I10 Essential (primary) hypertension: Secondary | ICD-10-CM | POA: Diagnosis not present

## 2014-07-12 DIAGNOSIS — R5383 Other fatigue: Secondary | ICD-10-CM | POA: Diagnosis not present

## 2014-07-13 DIAGNOSIS — D1801 Hemangioma of skin and subcutaneous tissue: Secondary | ICD-10-CM | POA: Diagnosis not present

## 2014-07-13 DIAGNOSIS — L579 Skin changes due to chronic exposure to nonionizing radiation, unspecified: Secondary | ICD-10-CM | POA: Diagnosis not present

## 2014-07-13 DIAGNOSIS — L820 Inflamed seborrheic keratosis: Secondary | ICD-10-CM | POA: Diagnosis not present

## 2014-07-13 DIAGNOSIS — D225 Melanocytic nevi of trunk: Secondary | ICD-10-CM | POA: Diagnosis not present

## 2014-07-13 DIAGNOSIS — L821 Other seborrheic keratosis: Secondary | ICD-10-CM | POA: Diagnosis not present

## 2014-07-20 DIAGNOSIS — I10 Essential (primary) hypertension: Secondary | ICD-10-CM | POA: Diagnosis not present

## 2014-07-20 DIAGNOSIS — R0683 Snoring: Secondary | ICD-10-CM | POA: Diagnosis not present

## 2014-07-20 DIAGNOSIS — R5383 Other fatigue: Secondary | ICD-10-CM | POA: Diagnosis not present

## 2014-07-20 DIAGNOSIS — G4733 Obstructive sleep apnea (adult) (pediatric): Secondary | ICD-10-CM | POA: Diagnosis not present

## 2014-07-28 DIAGNOSIS — R5383 Other fatigue: Secondary | ICD-10-CM | POA: Diagnosis not present

## 2014-07-28 DIAGNOSIS — I10 Essential (primary) hypertension: Secondary | ICD-10-CM | POA: Diagnosis not present

## 2014-07-28 DIAGNOSIS — G4733 Obstructive sleep apnea (adult) (pediatric): Secondary | ICD-10-CM | POA: Diagnosis not present

## 2014-07-28 DIAGNOSIS — R0683 Snoring: Secondary | ICD-10-CM | POA: Diagnosis not present

## 2014-08-02 ENCOUNTER — Encounter: Payer: Self-pay | Admitting: Podiatry

## 2014-08-02 ENCOUNTER — Ambulatory Visit (INDEPENDENT_AMBULATORY_CARE_PROVIDER_SITE_OTHER): Payer: Medicare Other | Admitting: Podiatry

## 2014-08-02 VITALS — BP 143/88 | HR 75 | Resp 16

## 2014-08-02 DIAGNOSIS — M79674 Pain in right toe(s): Secondary | ICD-10-CM

## 2014-08-02 DIAGNOSIS — L03011 Cellulitis of right finger: Secondary | ICD-10-CM

## 2014-08-02 DIAGNOSIS — L03041 Acute lymphangitis of right toe: Secondary | ICD-10-CM | POA: Diagnosis not present

## 2014-08-03 NOTE — Progress Notes (Signed)
Subjective:     Patient ID: [name], male   DOB: Apr 05, 1941, 74 y.o.   MRN: [id number]  HPI patient presents concerned about the second toe right foot stating that it's been a little bit red and discolored and he wanted to have it checked   Review of Systems     Objective:   Physical Exam Neurovascular status intact with no change in health history noted and noted to have a crusted right second nail was slight redness proximal to this but no proximal edema erythema or drainage noted    Assessment:     Crusted nailbed from healing surgery with possibility for localized light paronychia infection    Plan:     Advised on continued soaks and bandage treatment and that if any increase in redness were to occur swelling drainage or pain to let us know immediately

## 2014-08-23 DIAGNOSIS — Z125 Encounter for screening for malignant neoplasm of prostate: Secondary | ICD-10-CM | POA: Diagnosis not present

## 2014-08-23 DIAGNOSIS — E559 Vitamin D deficiency, unspecified: Secondary | ICD-10-CM | POA: Diagnosis not present

## 2014-08-23 DIAGNOSIS — E785 Hyperlipidemia, unspecified: Secondary | ICD-10-CM | POA: Diagnosis not present

## 2014-08-23 DIAGNOSIS — I10 Essential (primary) hypertension: Secondary | ICD-10-CM | POA: Diagnosis not present

## 2014-08-23 DIAGNOSIS — Z23 Encounter for immunization: Secondary | ICD-10-CM | POA: Diagnosis not present

## 2014-08-23 DIAGNOSIS — E663 Overweight: Secondary | ICD-10-CM | POA: Diagnosis not present

## 2014-08-23 DIAGNOSIS — Z0000 Encounter for general adult medical examination without abnormal findings: Secondary | ICD-10-CM | POA: Diagnosis not present

## 2014-08-25 DIAGNOSIS — H8102 Meniere's disease, left ear: Secondary | ICD-10-CM | POA: Diagnosis not present

## 2014-08-25 DIAGNOSIS — H903 Sensorineural hearing loss, bilateral: Secondary | ICD-10-CM | POA: Diagnosis not present

## 2014-08-25 DIAGNOSIS — G4730 Sleep apnea, unspecified: Secondary | ICD-10-CM | POA: Diagnosis not present

## 2014-08-30 DIAGNOSIS — M810 Age-related osteoporosis without current pathological fracture: Secondary | ICD-10-CM | POA: Diagnosis not present

## 2014-08-30 DIAGNOSIS — E559 Vitamin D deficiency, unspecified: Secondary | ICD-10-CM | POA: Diagnosis not present

## 2014-08-30 DIAGNOSIS — R739 Hyperglycemia, unspecified: Secondary | ICD-10-CM | POA: Diagnosis not present

## 2014-08-30 DIAGNOSIS — I10 Essential (primary) hypertension: Secondary | ICD-10-CM | POA: Diagnosis not present

## 2014-08-30 DIAGNOSIS — E785 Hyperlipidemia, unspecified: Secondary | ICD-10-CM | POA: Diagnosis not present

## 2014-10-09 ENCOUNTER — Other Ambulatory Visit: Payer: Self-pay | Admitting: Internal Medicine

## 2014-11-09 ENCOUNTER — Ambulatory Visit (INDEPENDENT_AMBULATORY_CARE_PROVIDER_SITE_OTHER): Payer: Medicare Other | Admitting: Podiatry

## 2014-11-09 ENCOUNTER — Encounter: Payer: Self-pay | Admitting: Podiatry

## 2014-11-09 DIAGNOSIS — M722 Plantar fascial fibromatosis: Secondary | ICD-10-CM | POA: Diagnosis not present

## 2014-11-09 MED ORDER — TRIAMCINOLONE ACETONIDE 10 MG/ML IJ SUSP
10.0000 mg | Freq: Once | INTRAMUSCULAR | Status: AC
  Administered 2014-11-09: 10 mg

## 2014-11-10 NOTE — Progress Notes (Signed)
Subjective:     Patient ID: [name], male   DOB: 01/02/1941, 74 y.o.   MRN: [id number]  HPI patient presents stating my heel is still bothering me quite a bit at the insertion of the tendon into the calcaneus with inflammation and fluid buildup noted   Review of Systems     Objective:   Physical Exam Plantar fasciitis noted right upon palpation with discomfort at the insertion of the tendon into the calcaneus    Assessment:     Continue plantar fasciitis right    Plan:     Reinjected the plantar fascia and reviewed physical therapy supportive shoes and recommended long-term orthotic treatment. Patient will be scanned for customized orthotic devices and be seen back

## 2015-02-16 DIAGNOSIS — J45909 Unspecified asthma, uncomplicated: Secondary | ICD-10-CM | POA: Diagnosis not present

## 2015-02-16 DIAGNOSIS — R05 Cough: Secondary | ICD-10-CM | POA: Diagnosis not present

## 2015-02-16 DIAGNOSIS — J9801 Acute bronchospasm: Secondary | ICD-10-CM | POA: Diagnosis not present

## 2015-02-23 DIAGNOSIS — J45909 Unspecified asthma, uncomplicated: Secondary | ICD-10-CM | POA: Diagnosis not present

## 2015-03-02 DIAGNOSIS — E559 Vitamin D deficiency, unspecified: Secondary | ICD-10-CM | POA: Diagnosis not present

## 2015-03-02 DIAGNOSIS — R739 Hyperglycemia, unspecified: Secondary | ICD-10-CM | POA: Diagnosis not present

## 2015-03-02 DIAGNOSIS — E785 Hyperlipidemia, unspecified: Secondary | ICD-10-CM | POA: Diagnosis not present

## 2015-03-02 DIAGNOSIS — I10 Essential (primary) hypertension: Secondary | ICD-10-CM | POA: Diagnosis not present

## 2015-03-04 DIAGNOSIS — R05 Cough: Secondary | ICD-10-CM | POA: Diagnosis not present

## 2015-03-09 DIAGNOSIS — E785 Hyperlipidemia, unspecified: Secondary | ICD-10-CM | POA: Diagnosis not present

## 2015-03-09 DIAGNOSIS — N182 Chronic kidney disease, stage 2 (mild): Secondary | ICD-10-CM | POA: Diagnosis not present

## 2015-03-09 DIAGNOSIS — R7309 Other abnormal glucose: Secondary | ICD-10-CM | POA: Diagnosis not present

## 2015-03-09 DIAGNOSIS — I129 Hypertensive chronic kidney disease with stage 1 through stage 4 chronic kidney disease, or unspecified chronic kidney disease: Secondary | ICD-10-CM | POA: Diagnosis not present

## 2015-03-10 ENCOUNTER — Encounter: Payer: Self-pay | Admitting: Internal Medicine

## 2015-03-10 ENCOUNTER — Ambulatory Visit (INDEPENDENT_AMBULATORY_CARE_PROVIDER_SITE_OTHER): Payer: Medicare Other | Admitting: Internal Medicine

## 2015-03-10 VITALS — BP 118/70 | HR 71 | Ht 66.0 in | Wt 179.0 lb

## 2015-03-10 DIAGNOSIS — R05 Cough: Secondary | ICD-10-CM | POA: Diagnosis not present

## 2015-03-10 DIAGNOSIS — R058 Other specified cough: Secondary | ICD-10-CM | POA: Insufficient documentation

## 2015-03-10 MED ORDER — FAMOTIDINE 20 MG PO TABS: ORAL_TABLET | ORAL | Status: DC

## 2015-03-10 MED ORDER — PREDNISONE 10 MG PO TABS: ORAL_TABLET | ORAL | Status: DC

## 2015-03-10 MED ORDER — BENZONATATE 200 MG PO CAPS: ORAL_CAPSULE | ORAL | Status: DC

## 2015-03-10 MED ORDER — PANTOPRAZOLE SODIUM 40 MG PO TBEC: 40.0000 mg | DELAYED_RELEASE_TABLET | Freq: Every day | ORAL | Status: DC

## 2015-03-10 NOTE — Progress Notes (Signed)
   Subjective:    Patient ID: [name], male    DOB: 28-Jan-1941,     MRN: [id number]  HPI  80 yowm never smoker with no h/o any respiratory complaints referred by Dr Thressa Sheller to pulmonary clinic 03/10/2015 for cough since early Sept 2016   03/10/2015 1st [facility] office visit/ Abdulkarim Eberlin   Chief Complaint  Patient presents with  . PULMONARY CONSULT    pt. ref. by dr. Thressa Sheller. pt. states he has prod. cough yellow in color. occ. wheezing. no chest pain/tightness.    Started as head cold then into chest rx erythromycin then pred then amox /pred / inhaler / albuterol Some better with tessilon and cough drops - also transiently better with pred then worse off it  Cough worst at hs and with voice use    No obvious other patterns in day to day or daytime variabilty or assoc sob  or cp or chest tightness, subjective wheeze overt sinus or hb symptoms. No unusual exp hx or h/o childhood pna/ asthma or knowledge of premature birth.  Sleeping ok without nocturnal  or early am exacerbation  of respiratory  c/o's or need for noct saba. Also denies any obvious fluctuation of symptoms with weather or environmental changes or other aggravating or alleviating factors except as outlined above   Current Medications, Allergies, Complete Past Medical History, Past Surgical History, Family History, and Social History were reviewed in [employer] record.            Review of Systems  Constitutional: Negative for fever, chills, activity change, appetite change and unexpected weight change.  HENT: Positive for congestion. Negative for dental problem, postnasal drip, rhinorrhea, sneezing, sore throat, trouble swallowing and voice change.   Eyes: Negative for visual disturbance.  Respiratory: Positive for cough. Negative for choking and shortness of breath.   Cardiovascular: Negative for chest pain and leg swelling.  Gastrointestinal: Negative for nausea, vomiting  and abdominal pain.  Genitourinary: Negative for difficulty urinating.  Musculoskeletal: Negative for arthralgias.  Skin: Negative for rash.  Psychiatric/Behavioral: Negative for behavioral problems and confusion.       Objective:   Physical Exam  amb wm nad quite hoarse   Wt Readings from Last 3 Encounters:  03/10/15 179 lb (81.194 kg)  03/27/14 179 lb (81.194 kg)  11/26/13 177 lb (80.287 kg)    Vital signs reviewed  HEENT: nl dentition, turbinates, and orophanx. Nl external ear canals without cough reflex   NECK :  without JVD/Nodes/TM/ nl carotid upstrokes bilaterally   LUNGS: no acc muscle use, clear to A and P bilaterally without consistent  cough on insp but sometime on insp, never on exp    CV:  RRR  no s3 or murmur or increase in P2, no edema   ABD:  soft and nontender with nl excursion in the supine position. No bruits or organomegaly, bowel sounds nl  MS:  warm without deformities, calf tenderness, cyanosis or clubbing  SKIN: warm and dry without lesions    NEURO:  alert, approp, no deficits     I personally reviewed images and agree with radiology impression as follows:  CXR:  03/04/15 wnl         Assessment & Plan:

## 2015-03-10 NOTE — Assessment & Plan Note (Addendum)
The most common causes of chronic cough in immunocompetent adults include the following: upper airway cough syndrome (UACS), previously referred to as postnasal drip syndrome (PNDS), which is caused by variety of rhinosinus conditions; (2) asthma; (3) GERD; (4) chronic bronchitis from cigarette smoking or other inhaled environmental irritants; (5) nonasthmatic eosinophilic bronchitis; and (6) bronchiectasis.   These conditions, singly or in combination, have accounted for up to 94% of the causes of chronic cough in prospective studies.   Other conditions have constituted no >6% of the causes in prospective studies These have included bronchogenic carcinoma, chronic interstitial pneumonia, sarcoidosis, left ventricular failure, ACEI-induced cough, and aspiration from a condition associated with pharyngeal dysfunction.    Chronic cough is often simultaneously caused by more than one condition. A single cause has been found from 38 to 82% of the time, multiple causes from 18 to 62%. Multiply caused cough has been the result of three diseases up to 42% of the time.       Based on hx and exam, this is most likely:  Classic Upper airway cough syndrome, so named because it's frequently impossible to sort out how much is  CR/sinusitis with freq throat clearing (which can be related to primary GERD)   vs  causing  secondary (" extra esophageal")  GERD from wide swings in gastric pressure that occur with throat clearing, often  promoting self use of mint and menthol lozenges that reduce the lower esophageal sphincter tone and exacerbate the problem further in a cyclical fashion.   These are the same pts (now being labeled as having "irritable larynx syndrome" by some cough centers) who not infrequently have a history of having failed to tolerate ace inhibitors,  dry powder inhalers or biphosphonates or report having atypical reflux symptoms that don't respond to standard doses of PPI , and are easily confused as  having aecopd or asthma flares by even experienced allergists/ pulmonologists.   The first step is to maximize acid suppression and eliminate cyclical coughing and f/u in 2 weeks with sinus ct in meantime  I had an extended discussion with the patient reviewing all relevant studies completed to date and  lasting 35 min  1) Explained: The standardized cough guidelines published in Chest by Lissa Morales in 2006 are still the best available and consist of a multiple step process (up to 12!) , not a single office visit,  and are intended  to address this problem logically,  with an alogrithm dependent on response to empiric treatment at  each progressive step  to determine a specific diagnosis with  minimal addtional testing needed. Therefore if adherence is an issue or can't be accurately verified,  it's very unlikely the standard evaluation and treatment will be successful here.    Furthermore, response to therapy (other than acute cough suppression, which should only be used short term with avoidance of narcotic containing cough syrups if possible), can be a gradual process for which the patient may perceive immediate benefit.  Unlike going to an eye doctor where the best perscription is almost always the first one and is immediately effective, this is almost never the case in the management of chronic cough syndromes. Therefore the patient needs to commit up front to consistently adhere to recommendations  for up to 6 weeks of therapy directed at the likely underlying problem(s) before the response can be reasonably evaluated.     2) Each maintenance medication was reviewed in detail including most importantly the difference between maintenance and  prns and under what circumstances the prns are to be triggered using an action plan format that is not reflected in the computer generated alphabetically organized AVS.    Please see instructions for details which were reviewed in writing and the patient  given a copy highlighting the part that I personally wrote and discussed at today's ov.   See instructions for specific recommendations which were reviewed directly with the patient who was given a copy with highlighter outlining the key components.

## 2015-03-10 NOTE — Patient Instructions (Addendum)
Pantoprazole (protonix) 40 mg   Take  30-60 min before first meal of the day and Pepcid (famotidine)  20 mg one @  bedtime until return to office - this is the best way to tell whether stomach acid is contributing to your problem.   GERD (REFLUX)  is an extremely common cause of respiratory symptoms just like yours , many times with no obvious heartburn at all.    It can be treated with medication, but also with lifestyle changes including elevation of the head of your bed (ideally with 6 inch  bed blocks),  Smoking cessation, avoidance of late meals, excessive alcohol, and avoid fatty foods, chocolate, peppermint, colas, red wine, and acidic juices such as orange juice.  NO MINT OR MENTHOL PRODUCTS SO NO COUGH DROPS  USE SUGARLESS CANDY INSTEAD (Jolley ranchers or Stover's or Life Savers) or even ice chips will also do - the key is to swallow to prevent all throat clearing. NO OIL BASED VITAMINS - use powdered substitutes.  Prednisone 10 mg take  4 each am x 2 days,   2 each am x 2 days,  1 each am x 2 days and stop    For cough > tessilon 200 mg every 4 hours   Please see patient coordinator before you leave today  to schedule sinus CT and if neg rec For drainage / throat tickle try take CHLORPHENIRAMINE  4 mg - take one every 4 hours as needed - available over the counter- may cause drowsiness so start with just a bedtime dose or two and see how you tolerate it before trying in daytime    Please schedule a follow up office visit in 2  weeks, sooner if needed

## 2015-03-15 ENCOUNTER — Ambulatory Visit (INDEPENDENT_AMBULATORY_CARE_PROVIDER_SITE_OTHER)
Admission: RE | Admit: 2015-03-15 | Discharge: 2015-03-15 | Disposition: A | Discharge status: Home / Self Care | Payer: Medicare Other | Source: Ambulatory Visit | Attending: Internal Medicine | Admitting: Internal Medicine

## 2015-03-15 DIAGNOSIS — R05 Cough: Secondary | ICD-10-CM | POA: Diagnosis not present

## 2015-03-15 DIAGNOSIS — R058 Other specified cough: Secondary | ICD-10-CM

## 2015-03-15 DIAGNOSIS — J342 Deviated nasal septum: Secondary | ICD-10-CM | POA: Diagnosis not present

## 2015-03-18 DIAGNOSIS — H18603 Keratoconus, unspecified, bilateral: Secondary | ICD-10-CM | POA: Diagnosis not present

## 2015-03-18 DIAGNOSIS — D3132 Benign neoplasm of left choroid: Secondary | ICD-10-CM | POA: Diagnosis not present

## 2015-03-18 DIAGNOSIS — H52213 Irregular astigmatism, bilateral: Secondary | ICD-10-CM | POA: Diagnosis not present

## 2015-03-18 DIAGNOSIS — Z961 Presence of intraocular lens: Secondary | ICD-10-CM | POA: Diagnosis not present

## 2015-03-28 ENCOUNTER — Encounter: Payer: Self-pay | Admitting: Internal Medicine

## 2015-03-28 ENCOUNTER — Ambulatory Visit (INDEPENDENT_AMBULATORY_CARE_PROVIDER_SITE_OTHER): Payer: Medicare Other | Admitting: Internal Medicine

## 2015-03-28 VITALS — BP 138/82 | HR 72 | Ht 66.0 in | Wt 183.4 lb

## 2015-03-28 DIAGNOSIS — R05 Cough: Secondary | ICD-10-CM | POA: Diagnosis not present

## 2015-03-28 DIAGNOSIS — R058 Other specified cough: Secondary | ICD-10-CM

## 2015-03-28 DIAGNOSIS — J45991 Cough variant asthma: Secondary | ICD-10-CM | POA: Diagnosis not present

## 2015-03-28 MED ORDER — MOMETASONE FURO-FORMOTEROL FUM 100-5 MCG/ACT IN AERO: INHALATION_SPRAY | RESPIRATORY_TRACT | Status: DC

## 2015-03-28 MED ORDER — PREDNISONE 10 MG PO TABS: ORAL_TABLET | ORAL | Status: DC

## 2015-03-28 NOTE — Patient Instructions (Signed)
Prednisone 10 mg take  4 each am x 2 days,   2 each am x 2 days,  1 each am x 2 days and stop   Start dulera 100 Take 2 puffs first thing in am and then another 2 puffs about 12 hours later.   Only use your albuterol as a rescue medication to be used if you can't catch your breath by resting or doing a relaxed purse lip breathing pattern.  - The less you use it, the better it will work when you need it. - Ok to use up to 2 puffs  every 4 hours if you must but call for immediate appointment if use goes up over your usual need - Don't leave home without it !!  (think of it like the spare tire for your car)   Please schedule a follow up office visit in 2 weeks, sooner if needed

## 2015-03-28 NOTE — Progress Notes (Signed)
Subjective:    Patient ID: [name], male    DOB: [birth year],     MRN: [id number]    Brief patient profile:  53 yowm never smoker with no h/o any respiratory complaints referred by Dr Thressa Sheller to pulmonary clinic 03/10/2015 for cough since early Sept 2016    History of Present Illness  03/10/2015 1st [facility] office visit/ Ranisha Allaire   Chief Complaint  Patient presents with  . PULMONARY CONSULT    pt. ref. by dr. Thressa Sheller. pt. states he has prod. cough yellow in color. occ. wheezing. no chest pain/tightness.   Started as head cold then into chest rx erythromycin then pred then amox /pred / inhaler / albuterol Some better with tessilon and cough drops - also transiently better with pred then worse off it  Cough worst at hs and with voice use  rec Pantoprazole (protonix) 40 mg   Take  30-60 min before first meal of the day and Pepcid (famotidine)  20 mg one @  bedtime until return to office - this is the best way to tell whether stomach acid is contributing to your problem.  GERD diet  Prednisone 10 mg take  4 each am x 2 days,   2 each am x 2 days,  1 each am x 2 days and stop   For cough > tessilon 200 mg every 4 hours    sinus CT  Neg >>  rec For drainage / throat tickle try take chlorpheniramine    03/28/2015  f/u ov/Kahlani Graber re: asthma  Chief Complaint  Patient presents with  . Follow-up    Pt denies cough. SOB, wheeze, CP/tightness. Pt does feel that there is PND/drainage with some throat clearing with white mucus. Pt is unsure if medications are helping.   much improved p prednisone and worse off it/ did not start h1 as rec   No obvious day to day or daytime variability or assoc chronic cough or cp or chest tightness, subjective wheeze or overt   hb symptoms. No unusual exp hx or h/o childhood pna/ asthma or knowledge of premature birth.  Sleeping ok without nocturnal  or early am exacerbation  of respiratory  c/o's or need for noct saba. Also denies any  obvious fluctuation of symptoms with weather or environmental changes or other aggravating or alleviating factors except as outlined above   Current Medications, Allergies, Complete Past Medical History, Past Surgical History, Family History, and Social History were reviewed in [employer] record.  ROS  The following are not active complaints unless bolded sore throat, dysphagia, dental problems, itching, sneezing,  nasal congestion or excess/ purulent secretions, ear ache,   fever, chills, sweats, unintended wt loss, classically pleuritic or exertional cp, hemoptysis,  orthopnea pnd or leg swelling, presyncope, palpitations, abdominal pain, anorexia, nausea, vomiting, diarrhea  or change in bowel or bladder habits, change in stools or urine, dysuria,hematuria,  rash, arthralgias, visual complaints, headache, numbness, weakness or ataxia or problems with walking or coordination,  change in mood/affect or memory.              Objective:   Physical Exam  amb wm nad less hoarse  03/28/2015   183   Wt Readings from Last 3 Encounters:  03/10/15 179 lb (81.194 kg)  03/27/14 179 lb (81.194 kg)  11/26/13 177 lb (80.287 kg)    Vital signs reviewed  HEENT: nl dentition, turbinates, and orophanx. Nl external ear canals without cough reflex  NECK :  without JVD/Nodes/TM/ nl carotid upstrokes bilaterally   LUNGS: no acc muscle use, clear to A and P bilaterally    CV:  RRR  no s3 or murmur or increase in P2, no edema   ABD:  soft and nontender with nl excursion in the supine position. No bruits or organomegaly, bowel sounds nl  MS:  warm without deformities, calf tenderness, cyanosis or clubbing  SKIN: warm and dry without lesions    NEURO:  alert, approp, no deficits     I personally reviewed images and agree with radiology impression as follows:  CXR:  03/04/15 wnl         Assessment & Plan:

## 2015-04-03 ENCOUNTER — Encounter: Payer: Self-pay | Admitting: Internal Medicine

## 2015-04-03 DIAGNOSIS — J45991 Cough variant asthma: Secondary | ICD-10-CM | POA: Insufficient documentation

## 2015-04-03 NOTE — Assessment & Plan Note (Signed)
Steroid responsive cough is suggestive of cough variant asthma, eosinophilic bronchitis, or eosinophilic rhinitis with post nasal drip syndrome. We'll try him on Dulera 100 2  puffs every 12 hours and bring him back in 2 weeks to regroup  The proper method of use, as well as anticipated side effects, of a metered-dose inhaler are discussed and demonstrated to the patient. Improved effectiveness after extensive coaching during this visit to a level of approximately  75%   I had an extended discussion with the patient reviewing all relevant studies completed to date and  lasting 15 to 20 minutes of a 25 minute visit    Each maintenance medication was reviewed in detail including most importantly the difference between maintenance and prns and under what circumstances the prns are to be triggered using an action plan format that is not reflected in the computer generated alphabetically organized AVS.    Please see instructions for details which were reviewed in writing and the patient given a copy highlighting the part that I personally wrote and discussed at today's ov.

## 2015-04-03 NOTE — Assessment & Plan Note (Signed)
-   sinus CT 03/15/15 No significant paranasal sinus disease. Nasal septal deviation LEFT-to-RIGHT of 3 mm.  Ok to use 1st gen H1 as per guidelines

## 2015-04-11 ENCOUNTER — Ambulatory Visit (INDEPENDENT_AMBULATORY_CARE_PROVIDER_SITE_OTHER): Payer: Medicare Other | Admitting: Internal Medicine

## 2015-04-11 ENCOUNTER — Encounter: Payer: Self-pay | Admitting: Internal Medicine

## 2015-04-11 ENCOUNTER — Other Ambulatory Visit: Payer: Medicare Other

## 2015-04-11 VITALS — BP 126/82 | HR 78 | Ht 67.0 in | Wt 182.6 lb

## 2015-04-11 DIAGNOSIS — J45991 Cough variant asthma: Secondary | ICD-10-CM

## 2015-04-11 DIAGNOSIS — R058 Other specified cough: Secondary | ICD-10-CM

## 2015-04-11 DIAGNOSIS — R05 Cough: Secondary | ICD-10-CM

## 2015-04-11 NOTE — Patient Instructions (Addendum)
Change dulera 100 one first thing in am and second dose 12 hours later  For drainage / throat tickle try take CHLORPHENIRAMINE  4 mg - take one every 4 hours as needed - available over the counter- may cause drowsiness so start with just a bedtime dose or two and see how you tolerate it before trying in daytime    GERD (REFLUX)  is an extremely common cause of respiratory symptoms just like yours , many times with no obvious heartburn at all.    It can be treated with medication, but also with lifestyle changes including elevation of the head of your bed (ideally with 6 inch  bed blocks),  Smoking cessation, avoidance of late meals, excessive alcohol, and avoid fatty foods, chocolate, peppermint, colas, red wine, and acidic juices such as orange juice.  NO MINT OR MENTHOL PRODUCTS SO NO COUGH DROPS  USE SUGARLESS CANDY INSTEAD (Jolley ranchers or Stover's or Life Savers) or even ice chips will also do - the key is to swallow to prevent all throat clearing. NO OIL BASED VITAMINS - use powdered substitutes.  Please remember to go to the lab department downstairs for your tests - we will call you with the results when they are available.  Please schedule a follow up office visit in 4 weeks, sooner if needed

## 2015-04-11 NOTE — Progress Notes (Signed)
Subjective:    Patient ID: [name], male    DOB: 13-Aug-1940,     MRN: [id number]    Brief patient profile:  68 yowm never smoker with no h/o any respiratory complaints referred by Dr Thressa Sheller to pulmonary clinic 03/10/2015 for cough since early Sept 2016    History of Present Illness  03/10/2015 1st [facility] office visit/ Wert   Chief Complaint  Patient presents with  . PULMONARY CONSULT    pt. ref. by dr. Thressa Sheller. pt. states he has prod. cough yellow in color. occ. wheezing. no chest pain/tightness.  Started as head cold then into chest rx erythromycin then pred then amox /pred / inhaler / albuterol Some better with tessilon and cough drops - also transiently better with pred then worse off it  Cough worst at hs and with voice use  rec Pantoprazole (protonix) 40 mg   Take  30-60 min before first meal of the day and Pepcid (famotidine)  20 mg one @  bedtime until return to office - this is the best way to tell whether stomach acid is contributing to your problem.  GERD diet  Prednisone 10 mg take  4 each am x 2 days,   2 each am x 2 days,  1 each am x 2 days and stop   For cough > tessilon 200 mg every 4 hours   sinus CT  Neg >>  rec For drainage / throat tickle try take chlorpheniramine    03/28/2015  f/u ov/Wert re: asthma  Chief Complaint  Patient presents with  . Follow-up    Pt denies cough. SOB, wheeze, CP/tightness. Pt does feel that there is PND/drainage with some throat clearing with white mucus. Pt is unsure if medications are helping.   much improved p prednisone and worse off it/ did not start h1 as rec  rec Prednisone 10 mg take  4 each am x 2 days,   2 each am x 2 days,  1 each am x 2 days and stop  Start dulera 100 Take 2 puffs first thing in am and then another 2 puffs about 12 hours later.  Only use your albuterol as a rescue medication   04/11/2015  f/u ov/Wert re: uacs vs asthma  Chief Complaint  Patient presents with  .  Follow-up    Pt states his breathing is doing well and has not had any cough. No new co's today.   8/10   2= sense of throat congestion daytime never noct Not limited by breathing from desired activities   Confused with names of meds, never tried h1 as rec/ using dulera at 1030 am, not first thing     No obvious day to day or daytime variability or assoc  cp or chest tightness, subjective wheeze or overt   hb symptoms. No unusual exp hx or h/o childhood pna/ asthma or knowledge of premature birth.  Sleeping ok without nocturnal  or early am exacerbation  of respiratory  c/o's or need for noct saba. Also denies any obvious fluctuation of symptoms with weather or environmental changes or other aggravating or alleviating factors except as outlined above   Current Medications, Allergies, Complete Past Medical History, Past Surgical History, Family History, and Social History were reviewed in [employer] record.  ROS  The following are not active complaints unless bolded sore throat, dysphagia, dental problems, itching, sneezing,  nasal congestion or excess/ purulent secretions, ear ache,   fever, chills,  sweats, unintended wt loss, classically pleuritic or exertional cp, hemoptysis,  orthopnea pnd or leg swelling, presyncope, palpitations, abdominal pain, anorexia, nausea, vomiting, diarrhea  or change in bowel or bladder habits, change in stools or urine, dysuria,hematuria,  rash, arthralgias, visual complaints, headache, numbness, weakness or ataxia or problems with walking or coordination,  change in mood/affect or memory.              Objective:   Physical Exam  amb wm nad less hoarse/ min throat clearing   03/28/2015   183 > 04/11/2015  183   Wt Readings from Last 3 Encounters:  03/10/15 179 lb (81.194 kg)  03/27/14 179 lb (81.194 kg)  11/26/13 177 lb (80.287 kg)    Vital signs reviewed  HEENT: nl dentition, turbinates, and oropharynx which is pristine. Nl  external ear canals without cough reflex   NECK :  without JVD/Nodes/TM/ nl carotid upstrokes bilaterally   LUNGS: no acc muscle use, clear to A and P bilaterally    CV:  RRR  no s3 or murmur or increase in P2, no edema   ABD:  soft and nontender with nl excursion in the supine position. No bruits or organomegaly, bowel sounds nl  MS:  warm without deformities, calf tenderness, cyanosis or clubbing  SKIN: warm and dry without lesions    NEURO:  alert, approp, no deficits     I personally reviewed images and agree with radiology impression as follows:  CXR:  03/04/15 wnl           Assessment & Plan:

## 2015-04-11 NOTE — Assessment & Plan Note (Signed)
-   sinus CT 03/15/15 No significant paranasal sinus disease. Nasal septal deviation LEFT-to-RIGHT of 3 mm. - Allergy profile 04/11/2015    Classic Upper airway cough syndrome, so named because it's frequently impossible to sort out how much is  CR/sinusitis with freq throat clearing (which can be related to primary GERD)   vs  causing  secondary (" extra esophageal")  GERD from wide swings in gastric pressure that occur with throat clearing, often  promoting self use of mint and menthol lozenges that reduce the lower esophageal sphincter tone and exacerbate the problem further in a cyclical fashion.   These are the same pts (now being labeled as having "irritable larynx syndrome" by some cough centers) who not infrequently have a history of having failed to tolerate ace inhibitors,  dry powder inhalers(or even sometime hfa ICS, which may be the case here)  or biphosphonates or report having atypical reflux symptoms that don't respond to standard doses of PPI , and are easily confused as having aecopd or asthma flares by even experienced allergists/ pulmonologists.   For now needs allergy profile, trial of h1, and avoidance of cough drops while tapering the ics to see if cough flares   Reviewed with pt  The standardized cough guidelines published in Chest by Lissa Morales in 2006 are still the best available and consist of a multiple step process (up to 12!) , not a single office visit,  and are intended  to address this problem logically,  with an alogrithm dependent on response to empiric treatment at  each progressive step  to determine a specific diagnosis with  minimal addtional testing needed. Therefore if adherence is an issue or can't be accurately verified,  it's very unlikely the standard evaluation and treatment will be successful here.    Furthermore, response to therapy (other than acute cough suppression, which should only be used short term with avoidance of narcotic containing cough syrups  if possible), can be a gradual process for which the patient may perceive immediate benefit.  Unlike going to an eye doctor where the best perscription is almost always the first one and is immediately effective, this is almost never the case in the management of chronic cough syndromes. Therefore the patient needs to commit up front to consistently adhere to recommendations  for up to 6 weeks of therapy directed at the likely underlying problem(s) before the response can be reasonably evaluated.   I had an extended discussion with the patient reviewing all relevant studies completed to date and  lasting 15 to 20 minutes of a 25 minute visit    Each maintenance medication was reviewed in detail including most importantly the difference between maintenance and prns and under what circumstances the prns are to be triggered using an action plan format that is not reflected in the computer generated alphabetically organized AVS.    Please see instructions for details which were reviewed in writing and the patient given a copy highlighting the part that I personally wrote and discussed at today's ov.

## 2015-04-11 NOTE — Assessment & Plan Note (Signed)
03/28/2015    try dulera 100 2bid  - 04/11/2015  extensive coaching HFA effectiveness =    90% > try dulera 100 one bid   Not clear this is asthma cough since not occuring at hs > try lower dose of ics and focus on ICS.

## 2015-04-12 LAB — ALLERGY FULL PROFILE
Allergen,Goose feathers, e70: <0.1 kU/L
Alternaria Alternata: <0.1 kU/L
Aspergillus fumigatus, m3: <0.1 kU/L
Candida Albicans: <0.1 kU/L
Common Ragweed: <0.1 kU/L
D. farinae: <0.1 kU/L
Dog Dander: <0.1 kU/L
Elm IgE: <0.1 kU/L
Fescue: 0.62 kU/L — ABNORMAL HIGH
G005 Rye, Perennial: 0.57 kU/L — ABNORMAL HIGH
G009 RED TOP: 0.44 kU/L — AB
Helminthosporium halodes: <0.1 kU/L
IGE (IMMUNOGLOBULIN E), SERUM: 23 kU/L (ref <115)
Oak: 0.59 kU/L — ABNORMAL HIGH
Stemphylium Botryosum: <0.1 kU/L
Sycamore Tree: <0.1 kU/L
Timothy Grass: 0.5 kU/L — ABNORMAL HIGH

## 2015-04-18 DIAGNOSIS — M25551 Pain in right hip: Secondary | ICD-10-CM | POA: Diagnosis not present

## 2015-04-19 ENCOUNTER — Telehealth: Payer: Self-pay | Admitting: Internal Medicine

## 2015-04-19 NOTE — Telephone Encounter (Signed)
Sorry for the delay, must have pushed the wrong button when I resulted it   Allergy profile pos RAST trees/grass but very mild, and would probably only be relevant in spring, will discuss in more detail at next ov if still having symptoms

## 2015-04-19 NOTE — Telephone Encounter (Signed)
Patient notified of lab results. Nothing further needed. Closing encounter

## 2015-04-19 NOTE — Telephone Encounter (Signed)
Patient would like results from blood work.  Results in epic.  MW - please advise.

## 2015-04-22 DIAGNOSIS — L821 Other seborrheic keratosis: Secondary | ICD-10-CM | POA: Diagnosis not present

## 2015-04-22 DIAGNOSIS — D1801 Hemangioma of skin and subcutaneous tissue: Secondary | ICD-10-CM | POA: Diagnosis not present

## 2015-04-22 DIAGNOSIS — D2371 Other benign neoplasm of skin of right lower limb, including hip: Secondary | ICD-10-CM | POA: Diagnosis not present

## 2015-04-22 DIAGNOSIS — L814 Other melanin hyperpigmentation: Secondary | ICD-10-CM | POA: Diagnosis not present

## 2015-04-25 DIAGNOSIS — R509 Fever, unspecified: Secondary | ICD-10-CM | POA: Diagnosis not present

## 2015-04-27 ENCOUNTER — Encounter: Payer: Self-pay | Admitting: Internal Medicine

## 2015-04-27 ENCOUNTER — Ambulatory Visit (INDEPENDENT_AMBULATORY_CARE_PROVIDER_SITE_OTHER): Payer: Medicare Other | Admitting: Internal Medicine

## 2015-04-27 VITALS — BP 132/72 | HR 86 | Temp 98.3°F | Ht 67.0 in | Wt 183.2 lb

## 2015-04-27 DIAGNOSIS — R05 Cough: Secondary | ICD-10-CM | POA: Diagnosis not present

## 2015-04-27 DIAGNOSIS — R058 Other specified cough: Secondary | ICD-10-CM

## 2015-04-27 MED ORDER — PREDNISONE 10 MG PO TABS: ORAL_TABLET | ORAL | Status: DC

## 2015-04-27 MED ORDER — AMOXICILLIN-POT CLAVULANATE 875-125 MG PO TABS: 1.0000 | ORAL_TABLET | Freq: Two times a day (BID) | ORAL | Status: DC

## 2015-04-27 NOTE — Progress Notes (Signed)
Subjective:    Patient ID: [name], male    DOB: [birth year],     MRN: [id number]    Brief patient profile:  23 yowm never smoker with no h/o any respiratory complaints referred by Dr Thressa Sheller to pulmonary clinic 03/10/2015 for cough since early Sept 2016    History of Present Illness  03/10/2015 1st [facility] office visit/ Deijah Spikes   Chief Complaint  Patient presents with  . PULMONARY CONSULT    pt. ref. by dr. Thressa Sheller. pt. states he has prod. cough yellow in color. occ. wheezing. no chest pain/tightness.  Started as head cold then into chest rx erythromycin then pred then amox /pred / inhaler / albuterol Some better with tessilon and cough drops - also transiently better with pred then worse off it  Cough worst at hs and with voice use  rec Pantoprazole (protonix) 40 mg   Take  30-60 min before first meal of the day and Pepcid (famotidine)  20 mg one @  bedtime until return to office - this is the best way to tell whether stomach acid is contributing to your problem.  GERD diet  Prednisone 10 mg take  4 each am x 2 days,   2 each am x 2 days,  1 each am x 2 days and stop   For cough > tessilon 200 mg every 4 hours   sinus CT  Neg >>  rec For drainage / throat tickle try take chlorpheniramine    03/28/2015  f/u ov/Brelan Hannen re: asthma  Chief Complaint  Patient presents with  . Follow-up    Pt denies cough. SOB, wheeze, CP/tightness. Pt does feel that there is PND/drainage with some throat clearing with white mucus. Pt is unsure if medications are helping.   much improved p prednisone and worse off it/ did not start h1 as rec  rec Prednisone 10 mg take  4 each am x 2 days,   2 each am x 2 days,  1 each am x 2 days and stop  Start dulera 100 Take 2 puffs first thing in am and then another 2 puffs about 12 hours later.  Only use your albuterol as a rescue medication   04/11/2015  f/u ov/Anay Walter re: uacs vs asthma  Chief Complaint  Patient presents with  .  Follow-up    Pt states his breathing is doing well and has not had any cough. No new co's today.   8/10   2= sense of throat congestion daytime never noct Not limited by breathing from desired activities   Confused with names of meds, never tried h1 as rec/ using dulera at 1030 am, not first thing rec Change dulera 100 one first thing in am and second dose 12 hours later For drainage / throat tickle try take CHLORPHENIRAMINE  4 mg - take one every 4 hours as needed   GERD diet      04/27/2015 acute extended ov/Harry Shuck re: worsening throat and chest "congestion" off gerd rx  Chief Complaint  Patient presents with  . Acute Visit    Pt c/o fever on and off for the past 4 days. He states "upper chest" is still congested but not coughing.   confused with instruction from last ov/ stopped the gerd rx "I don't have HB"> much worse since then but still Not limited by breathing from desired activities   Could not tol h1 > drowsy. Already saw primary care for fever, no source identified  No obvious day to day or daytime variability or assoc  cp or chest tightness, subjective wheeze or overt   hb symptoms. No unusual exp hx or h/o childhood pna/ asthma or knowledge of premature birth.  Sleeping ok without nocturnal  or early am exacerbation  of respiratory  c/o's or need for noct saba. Also denies any obvious fluctuation of symptoms with weather or environmental changes or other aggravating or alleviating factors except as outlined above   Current Medications, Allergies, Complete Past Medical History, Past Surgical History, Family History, and Social History were reviewed in [employer] record.  ROS  The following are not active complaints unless bolded sore throat, dysphagia, dental problems, itching, sneezing,  nasal congestion or excess/ purulent secretions, ear ache,   fever, chills, sweats, unintended wt loss, classically pleuritic or exertional cp, hemoptysis,  orthopnea pnd  or leg swelling, presyncope, palpitations, abdominal pain, anorexia, nausea, vomiting, diarrhea  or change in bowel or bladder habits, change in stools or urine, dysuria,hematuria,  rash, arthralgias, visual complaints, headache, numbness, weakness or ataxia or problems with walking or coordination,  change in mood/affect or memory.              Objective:   Physical Exam  amb wm nad much more hoarse, much more throat clearing   03/28/2015   183 > 04/11/2015  183 > 04/27/2015    183      03/10/15 179 lb (81.194 kg)  03/27/14 179 lb (81.194 kg)  11/26/13 177 lb (80.287 kg)    Vital signs reviewed  HEENT: nl dentition, turbinates, and oropharynx which is pristine. Nl external ear canals without cough reflex   NECK :  without JVD/Nodes/TM/ nl carotid upstrokes bilaterally   LUNGS: no acc muscle use, clear to A and P bilaterally    CV:  RRR  no s3 or murmur or increase in P2, no edema   ABD:  soft and nontender with nl excursion in the supine position. No bruits or organomegaly, bowel sounds nl  MS:  warm without deformities, calf tenderness, cyanosis or clubbing  SKIN: warm and dry without lesions    NEURO:  alert, approp, no deficits     I personally reviewed images and agree with radiology impression as follows:  CXR:  03/04/15 wnl           Assessment & Plan:

## 2015-04-27 NOTE — Patient Instructions (Addendum)
Stay on dulera 100 one twice daily  Resume Pantoprazole (protonix) 40 mg   Take  30-60 min before first meal of the day and Pepcid (famotidine)  20 mg one @  bedtime until return to office - this is the best way to tell whether stomach acid is contributing to your problem.    Augmentin 875 mg take one pill twice daily  X 10 days - take at breakfast and supper with large glass of water.  It would help reduce the usual side effects (diarrhea and yeast infections) if you ate cultured yogurt at lunch.   Prednisone 10 mg take  4 each am x 2 days,   2 each am x 2 days,  1 each am x 2 days and stop   For drainage zyrtec 10 mg one at bedtime (over the counter)   Keep your original appt for the 19th of dec and bring all active medications

## 2015-04-28 DIAGNOSIS — R509 Fever, unspecified: Secondary | ICD-10-CM | POA: Diagnosis not present

## 2015-04-28 DIAGNOSIS — J45909 Unspecified asthma, uncomplicated: Secondary | ICD-10-CM | POA: Diagnosis not present

## 2015-04-28 DIAGNOSIS — M1611 Unilateral primary osteoarthritis, right hip: Secondary | ICD-10-CM | POA: Diagnosis not present

## 2015-04-29 NOTE — Assessment & Plan Note (Addendum)
-   sinus CT 03/15/15 No significant paranasal sinus disease. Nasal septal deviation LEFT-to-RIGHT of 3 mm. - Allergy profile 04/11/2015 >  IgE 23 and pos RAST trees/grass  Flare off gerd rx even in the absence of HB strongly favors  Classic Upper airway cough syndrome, so named because it's frequently impossible to sort out how much is  CR/sinusitis with freq throat clearing (which can be related to primary GERD)   vs  causing  secondary (" extra esophageal")  GERD from wide swings in gastric pressure that occur with throat clearing, often  promoting self use of mint and menthol lozenges that reduce the lower esophageal sphincter tone and exacerbate the problem further in a cyclical fashion.   These are the same pts (now being labeled as having "irritable larynx syndrome" by some cough centers) who not infrequently have a history of having failed to tolerate ace inhibitors,  dry powder inhalers or biphosphonates or report having atypical reflux symptoms that don't respond to standard doses of PPI , and are easily confused as having aecopd or asthma flares by even experienced allergists/ pulmonologists.   rec restart gerd rx/ try to see if can tol zyrtec since can't take 1st gen H1/ since reports fever empirically add rx with augmentin x 10 days/f/u in 10 days as planned   I had an extended discussion with the patient reviewing all relevant studies completed to date and  lasting 15 to 20 minutes of a 25 minute visit    Each maintenance medication was reviewed in detail including most importantly the difference between maintenance and prns and under what circumstances the prns are to be triggered using an action plan format that is not reflected in the computer generated alphabetically organized AVS.    Please see instructions for details which were reviewed in writing and the patient given a copy highlighting the part that I personally wrote and discussed at today's ov.

## 2015-04-30 DIAGNOSIS — M722 Plantar fascial fibromatosis: Secondary | ICD-10-CM | POA: Diagnosis not present

## 2015-04-30 DIAGNOSIS — M25571 Pain in right ankle and joints of right foot: Secondary | ICD-10-CM | POA: Diagnosis not present

## 2015-05-09 ENCOUNTER — Ambulatory Visit: Payer: Medicare Other | Admitting: Internal Medicine

## 2015-05-09 ENCOUNTER — Ambulatory Visit (INDEPENDENT_AMBULATORY_CARE_PROVIDER_SITE_OTHER): Payer: Medicare Other | Admitting: Internal Medicine

## 2015-05-09 ENCOUNTER — Encounter: Payer: Self-pay | Admitting: Internal Medicine

## 2015-05-09 VITALS — BP 140/80 | HR 80 | Wt 180.0 lb

## 2015-05-09 DIAGNOSIS — R05 Cough: Secondary | ICD-10-CM

## 2015-05-09 DIAGNOSIS — R058 Other specified cough: Secondary | ICD-10-CM

## 2015-05-09 DIAGNOSIS — Z23 Encounter for immunization: Secondary | ICD-10-CM | POA: Diagnosis not present

## 2015-05-09 DIAGNOSIS — J45991 Cough variant asthma: Secondary | ICD-10-CM

## 2015-05-09 NOTE — Patient Instructions (Addendum)
Stop dulera and see what difference this makes  Continue to suppress acid until the cough and throat symptoms are 100% gone x 2 weeks without the need for cough medication  Suppress the urge to clear your throat with the candy and benzonate   Consider a week away from the house to see what difference it makes  Please schedule a follow up office visit in 4 weeks, sooner if needed

## 2015-05-09 NOTE — Progress Notes (Signed)
Subjective:    Patient ID: [name], male    DOB: 15-Mar-1941,     MRN: [id number]    Brief patient profile:  61 yowm never smoker with no h/o any respiratory complaints referred by Dr Thressa Sheller to pulmonary clinic 03/10/2015 for cough since early Sept 2016    History of Present Illness  03/10/2015 1st [facility] office visit/ Tomia Enlow   Chief Complaint  Patient presents with  . PULMONARY CONSULT    pt. ref. by dr. Thressa Sheller. pt. states he has prod. cough yellow in color. occ. wheezing. no chest pain/tightness.  Started as head cold then into chest rx erythromycin then pred then amox /pred / inhaler / albuterol Some better with tessilon and cough drops - also transiently better with pred then worse off it  Cough worst at hs and with voice use  rec Pantoprazole (protonix) 40 mg   Take  30-60 min before first meal of the day and Pepcid (famotidine)  20 mg one @  bedtime until return to office - this is the best way to tell whether stomach acid is contributing to your problem.  GERD diet  Prednisone 10 mg take  4 each am x 2 days,   2 each am x 2 days,  1 each am x 2 days and stop   For cough > tessilon 200 mg every 4 hours   sinus CT  Neg >   rec For drainage / throat tickle try take chlorpheniramine    03/28/2015  f/u ov/Bridgitte Felicetti re: asthma  Chief Complaint  Patient presents with  . Follow-up    Pt denies cough. SOB, wheeze, CP/tightness. Pt does feel that there is PND/drainage with some throat clearing with white mucus. Pt is unsure if medications are helping.   much improved p prednisone and worse off it/ did not start h1 as rec  rec Prednisone 10 mg take  4 each am x 2 days,   2 each am x 2 days,  1 each am x 2 days and stop  Start dulera 100 Take 2 puffs first thing in am and then another 2 puffs about 12 hours later.  Only use your albuterol as a rescue medication   04/11/2015  f/u ov/Maelie Chriswell re: uacs vs asthma  Chief Complaint  Patient presents with  .  Follow-up    Pt states his breathing is doing well and has not had any cough. No new co's today.   8/10   2= sense of throat congestion daytime never noct Not limited by breathing from desired activities   Confused with names of meds, never tried h1 as rec/ using dulera at 1030 am, not first thing rec Change dulera 100 one first thing in am and second dose 12 hours later For drainage / throat tickle try take CHLORPHENIRAMINE  4 mg - take one every 4 hours as needed   GERD diet      04/27/2015 acute extended ov/Jeff Frieden re: worsening throat and chest "congestion" off gerd rx  Chief Complaint  Patient presents with  . Acute Visit    Pt c/o fever on and off for the past 4 days. He states "upper chest" is still congested but not coughing.   confused with instruction from last ov/ stopped the gerd rx "I don't have HB"> much worse since then but still Not limited by breathing from desired activities   Could not tol h1 > drowsy. Already saw primary care for fever, no source identified  rec Stay on dulera 100 one twice daily  Resume Pantoprazole (protonix) 40 mg   Take  30-60 min before first meal of the day and Pepcid (famotidine)  20 mg one @  bedtime until return to office - this is the best way to tell whether stomach acid is contributing to your problem.   Augmentin 875 mg take one pill twice daily  X 10 days Prednisone 10 mg take  4 each am x 2 days,   2 each am x 2 days,  1 each am x 2 days and stop  For drainage zyrtec 10 mg one at bedtime (over the counter)   Keep your original appt for the 19th of dec and bring all active medications    05/09/2015  f/u ov/Chayne Baumgart re: uacs vs cough variant asthma  still on dulera 100 Chief Complaint  Patient presents with  . Follow-up    Pt states he is overall feeling better, but c/o hoarseness still. No new co's today.   Not limited by breathing from desired activities    No obvious day to day or daytime variability or assoc excess/ purulent sputum or  mucus plugs    cp or chest tightness, subjective wheeze or overt   hb symptoms. No unusual exp hx or h/o childhood pna/ asthma or knowledge of premature birth.  Sleeping ok without nocturnal  or early am exacerbation  of respiratory  c/o's or need for noct saba. Also denies any obvious fluctuation of symptoms with weather or environmental changes or other aggravating or alleviating factors except as outlined above   Current Medications, Allergies, Complete Past Medical History, Past Surgical History, Family History, and Social History were reviewed in [employer] record.  ROS  The following are not active complaints unless bolded sore throat, dysphagia, dental problems, itching, sneezing,  nasal congestion or excess/ purulent secretions, ear ache,   fever, chills, sweats, unintended wt loss, classically pleuritic or exertional cp, hemoptysis,  orthopnea pnd or leg swelling, presyncope, palpitations, abdominal pain, anorexia, nausea, vomiting, diarrhea  or change in bowel or bladder habits, change in stools or urine, dysuria,hematuria,  rash, arthralgias, visual complaints, headache, numbness, weakness or ataxia or problems with walking or coordination,  change in mood/affect or memory.              Objective:   Physical Exam  amb wm nad  Still  hoarse, still  throat clearing   03/28/2015   183 > 04/11/2015  183 > 04/27/2015    183 > 05/09/2015    180      03/10/15 179 lb (81.194 kg)  03/27/14 179 lb (81.194 kg)  11/26/13 177 lb (80.287 kg)    Vital signs reviewed  HEENT: nl dentition, turbinates, and oropharynx which is pristine. Nl external ear canals without cough reflex   NECK :  without JVD/Nodes/TM/ nl carotid upstrokes bilaterally   LUNGS: no acc muscle use, clear to A and P bilaterally    CV:  RRR  no s3 or murmur or increase in P2, no edema   ABD:  soft and nontender with nl excursion in the supine position. No bruits or organomegaly, bowel sounds  nl  MS:  warm without deformities, calf tenderness, cyanosis or clubbing  SKIN: warm and dry without lesions    NEURO:  alert, approp, no deficits     I personally reviewed images and agree with radiology impression as follows:  CXR:  03/04/15 wnl  Assessment & Plan:

## 2015-05-10 ENCOUNTER — Telehealth: Payer: Self-pay | Admitting: Internal Medicine

## 2015-05-10 NOTE — Telephone Encounter (Signed)
Called spoke with pt. He reports MW advised him of several medications for him to stop. He reports the zyrtec was never mentioned and just wants to know if he needs to continue this or not. Please advise MW thanks

## 2015-05-10 NOTE — Telephone Encounter (Signed)
Patient notified of Dr. Wert's recommendations. Nothing further needed.  

## 2015-05-10 NOTE — Telephone Encounter (Signed)
For drainage zyrtec 10 mg one at bedtime (over the counter) as needed

## 2015-05-17 NOTE — Assessment & Plan Note (Signed)
03/28/2015    try dulera 100 2bid  - 04/11/2015    try dulera 100 one bid  - 05/09/2015  extensive coaching HFA effectiveness =    90% > try off dulera   THere is really very little to support dx  asthma here. I want to try him off of the duleracompletely at this point

## 2015-05-17 NOTE — Assessment & Plan Note (Signed)
-   sinus CT 03/15/15 No significant paranasal sinus disease. Nasal septal deviation LEFT-to-RIGHT of 3 mm. - Allergy profile 04/11/2015 >  IgE 23 and pos RAST trees/grass  Improved with rx directed at uacs except for throat symptoms ? Related to ICS which I've asked him to stop at this point using a reverse therapeutic trial  I had an extended discussion with the patient reviewing all relevant studies completed to date and  lasting 15 to 20 minutes of a 25 minute visit    Each maintenance medication was reviewed in detail including most importantly the difference between maintenance and prns and under what circumstances the prns are to be triggered using an action plan format that is not reflected in the computer generated alphabetically organized AVS.    Please see instructions for details which were reviewed in writing and the patient given a copy highlighting the part that I personally wrote and discussed at today's ov.

## 2015-05-27 DIAGNOSIS — M1611 Unilateral primary osteoarthritis, right hip: Secondary | ICD-10-CM | POA: Diagnosis not present

## 2015-06-07 ENCOUNTER — Ambulatory Visit: Payer: Medicare Other | Admitting: Internal Medicine

## 2015-07-01 ENCOUNTER — Other Ambulatory Visit: Payer: Self-pay | Admitting: Internal Medicine

## 2015-07-01 MED ORDER — PANTOPRAZOLE SODIUM 40 MG PO TBEC: 40.0000 mg | DELAYED_RELEASE_TABLET | Freq: Every day | ORAL | Status: DC

## 2015-07-29 DIAGNOSIS — M722 Plantar fascial fibromatosis: Secondary | ICD-10-CM | POA: Diagnosis not present

## 2015-08-03 DIAGNOSIS — M79642 Pain in left hand: Secondary | ICD-10-CM | POA: Diagnosis not present

## 2015-08-03 DIAGNOSIS — M79645 Pain in left finger(s): Secondary | ICD-10-CM | POA: Diagnosis not present

## 2015-08-24 DIAGNOSIS — I129 Hypertensive chronic kidney disease with stage 1 through stage 4 chronic kidney disease, or unspecified chronic kidney disease: Secondary | ICD-10-CM | POA: Diagnosis not present

## 2015-08-24 DIAGNOSIS — Z0000 Encounter for general adult medical examination without abnormal findings: Secondary | ICD-10-CM | POA: Diagnosis not present

## 2015-08-24 DIAGNOSIS — E0590 Thyrotoxicosis, unspecified without thyrotoxic crisis or storm: Secondary | ICD-10-CM | POA: Diagnosis not present

## 2015-08-24 DIAGNOSIS — Z1389 Encounter for screening for other disorder: Secondary | ICD-10-CM | POA: Diagnosis not present

## 2015-08-24 DIAGNOSIS — E663 Overweight: Secondary | ICD-10-CM | POA: Diagnosis not present

## 2015-08-24 DIAGNOSIS — E559 Vitamin D deficiency, unspecified: Secondary | ICD-10-CM | POA: Diagnosis not present

## 2015-08-24 DIAGNOSIS — Z125 Encounter for screening for malignant neoplasm of prostate: Secondary | ICD-10-CM | POA: Diagnosis not present

## 2015-08-24 DIAGNOSIS — I10 Essential (primary) hypertension: Secondary | ICD-10-CM | POA: Diagnosis not present

## 2015-08-30 DIAGNOSIS — H903 Sensorineural hearing loss, bilateral: Secondary | ICD-10-CM | POA: Diagnosis not present

## 2015-08-30 DIAGNOSIS — H8102 Meniere's disease, left ear: Secondary | ICD-10-CM | POA: Diagnosis not present

## 2015-08-30 DIAGNOSIS — G4730 Sleep apnea, unspecified: Secondary | ICD-10-CM | POA: Diagnosis not present

## 2015-08-31 DIAGNOSIS — N182 Chronic kidney disease, stage 2 (mild): Secondary | ICD-10-CM | POA: Diagnosis not present

## 2015-08-31 DIAGNOSIS — E785 Hyperlipidemia, unspecified: Secondary | ICD-10-CM | POA: Diagnosis not present

## 2015-08-31 DIAGNOSIS — I129 Hypertensive chronic kidney disease with stage 1 through stage 4 chronic kidney disease, or unspecified chronic kidney disease: Secondary | ICD-10-CM | POA: Diagnosis not present

## 2015-08-31 DIAGNOSIS — N529 Male erectile dysfunction, unspecified: Secondary | ICD-10-CM | POA: Diagnosis not present

## 2015-09-01 DIAGNOSIS — D2371 Other benign neoplasm of skin of right lower limb, including hip: Secondary | ICD-10-CM | POA: Diagnosis not present

## 2015-09-01 DIAGNOSIS — L821 Other seborrheic keratosis: Secondary | ICD-10-CM | POA: Diagnosis not present

## 2015-09-19 DIAGNOSIS — I10 Essential (primary) hypertension: Secondary | ICD-10-CM | POA: Diagnosis not present

## 2015-09-23 DIAGNOSIS — M1611 Unilateral primary osteoarthritis, right hip: Secondary | ICD-10-CM | POA: Diagnosis not present

## 2015-09-23 DIAGNOSIS — I129 Hypertensive chronic kidney disease with stage 1 through stage 4 chronic kidney disease, or unspecified chronic kidney disease: Secondary | ICD-10-CM | POA: Diagnosis not present

## 2015-10-01 ENCOUNTER — Other Ambulatory Visit: Payer: Self-pay | Admitting: Family Medicine

## 2015-10-26 DIAGNOSIS — M1611 Unilateral primary osteoarthritis, right hip: Secondary | ICD-10-CM | POA: Diagnosis not present

## 2015-10-26 NOTE — H&P (Signed)
TOTAL HIP ADMISSION H&P  Patient is admitted for right total hip arthroplasty, anterior approach.  Subjective:  Chief Complaint:    Right hip primary OA / pain  HPI: [name], 75 y.o. male, has a history of pain and functional disability in the right hip(s) due to arthritis and patient has failed non-surgical conservative treatments for greater than 12 weeks to include NSAID's and/or analgesics, corticosteriod injections, use of assistive devices and activity modification.  Onset of symptoms was gradual starting 1+ years ago with gradually worsening course since that time.The patient noted no past surgery on the right hip(s).  Patient currently rates pain in the right hip at 8 out of 10 with activity. Patient has worsening of pain with activity and weight bearing, trendelenberg gait, pain that interfers with activities of daily living and pain with passive range of motion. Patient has evidence of periarticular osteophytes and joint space narrowing by imaging studies. This condition presents safety issues increasing the risk of falls.  There is no current active infection.  Risks, benefits and expectations were discussed with the patient.  Risks including but not limited to the risk of anesthesia, blood clots, nerve damage, blood vessel damage, failure of the prosthesis, infection and up to and including death.  Patient understand the risks, benefits and expectations and wishes to proceed with surgery.   PCP: Dennis Sheller, MD  D/C Plans:      Hom  Post-op Meds:       No Rx given  Tranexamic Acid:      To be given - IV    Decadron:      Is to be given  FYI:     ASA  Norco    Patient Active Problem List   Diagnosis Date Noted  . Cough variant asthma 04/03/2015  . Upper airway cough syndrome 03/10/2015  . Groin strain-right 11/17/2013  . HYPERLIPIDEMIA 02/12/2007  . Essential hypertension 02/12/2007  . COLONIC POLYPS, HX OF 02/12/2007  . NEPHROLITHIASIS, HX OF 02/12/2007   Past  Medical History  Diagnosis Date  . Colon polyps   . HLD (hyperlipidemia)   . History of nephrolithiasis   . Carotid artery plaque     mild  . HTN (hypertension)     Past Surgical History  Procedure Laterality Date  . Laparoscopic drainage renal cyst    . Lithotripsy    . Hemorrhoid surgery    . Inguinal hernia repair    . Cataract Bilateral 2015    No prescriptions prior to admission   No Known Allergies   Social History  Substance Use Topics  . Smoking status: Never Smoker   . Smokeless tobacco: Never Used  . Alcohol Use: No    Family History  Problem Relation Age of Onset  . Colon cancer Father   . Heart attack Mother 69  . Diabetes      1st degree relative     Review of Systems  Constitutional: Negative.   HENT: Negative.   Eyes: Negative.   Respiratory: Negative.   Cardiovascular: Negative.   Gastrointestinal: Negative.   Genitourinary: Negative.   Musculoskeletal: Positive for joint pain.  Skin: Negative.   Neurological: Negative.   Endo/Heme/Allergies: Negative.   Psychiatric/Behavioral: Negative.     Objective:  Physical Exam  Constitutional: He is oriented to person, place, and time. He appears well-developed.  HENT:  Head: Normocephalic.  Eyes: Pupils are equal, round, and reactive to light.  Neck: Neck supple. No JVD present. No tracheal deviation present. No  thyromegaly present.  Cardiovascular: Normal rate, regular rhythm, normal heart sounds and intact distal pulses.   Respiratory: Effort normal and breath sounds normal. No stridor. No respiratory distress. He has no wheezes.  GI: Soft. There is no tenderness. There is no guarding.  Musculoskeletal:       Right hip: He exhibits decreased range of motion, decreased strength, tenderness and bony tenderness. He exhibits no swelling, no deformity and no laceration.  Lymphadenopathy:    He has no cervical adenopathy.  Neurological: He is alert and oriented to person, place, and time.  Skin:  Skin is warm and dry.  Psychiatric: He has a normal mood and affect.      Labs:  Estimated body mass index is 28.19 kg/(m^2) as calculated from the following:   Height as of 04/27/15: 5\' 7"  (1.702 m).   Weight as of 05/09/15: 81.647 kg (180 lb).   Imaging Review Plain radiographs demonstrate severe degenerative joint disease of the right hip(s). The bone quality appears to be good for age and reported activity level.  Assessment/Plan:  End stage arthritis, right hip(s)  The patient history, physical examination, clinical judgement of the provider and imaging studies are consistent with end stage degenerative joint disease of the right hip(s) and total hip arthroplasty is deemed medically necessary. The treatment options including medical management, injection therapy, arthroscopy and arthroplasty were discussed at length. The risks and benefits of total hip arthroplasty were presented and reviewed. The risks due to aseptic loosening, infection, stiffness, dislocation/subluxation,  thromboembolic complications and other imponderables were discussed.  The patient acknowledged the explanation, agreed to proceed with the plan and consent was signed. Patient is being admitted for inpatient treatment for surgery, pain control, PT, OT, prophylactic antibiotics, VTE prophylaxis, progressive ambulation and ADL's and discharge planning.The patient is planning to be discharged home with home health services.     [name]   PA-C  10/26/2015, 1:04 PM

## 2015-11-07 ENCOUNTER — Encounter (HOSPITAL_COMMUNITY): Payer: Self-pay

## 2015-11-07 ENCOUNTER — Encounter (HOSPITAL_COMMUNITY)
Admission: RE | Admit: 2015-11-07 | Discharge: 2015-11-07 | Disposition: A | Discharge status: Home / Self Care | Payer: Medicare Other | Source: Ambulatory Visit | Attending: Orthopedic Surgery | Admitting: Orthopedic Surgery

## 2015-11-07 DIAGNOSIS — M25551 Pain in right hip: Secondary | ICD-10-CM | POA: Insufficient documentation

## 2015-11-07 DIAGNOSIS — M1611 Unilateral primary osteoarthritis, right hip: Secondary | ICD-10-CM | POA: Diagnosis not present

## 2015-11-07 DIAGNOSIS — Z01810 Encounter for preprocedural cardiovascular examination: Secondary | ICD-10-CM | POA: Diagnosis not present

## 2015-11-07 DIAGNOSIS — Z01812 Encounter for preprocedural laboratory examination: Secondary | ICD-10-CM | POA: Diagnosis present

## 2015-11-07 HISTORY — DX: Male erectile dysfunction, unspecified: N52.9

## 2015-11-07 HISTORY — DX: Unspecified hearing loss, unspecified ear: H91.90

## 2015-11-07 HISTORY — DX: Sleep apnea, unspecified: G47.30

## 2015-11-07 HISTORY — DX: Unspecified osteoarthritis, unspecified site: M19.90

## 2015-11-07 HISTORY — DX: Gastro-esophageal reflux disease without esophagitis: K21.9

## 2015-11-07 HISTORY — DX: Reserved for inherently not codable concepts without codable children: IMO0001

## 2015-11-07 LAB — CBC
HEMATOCRIT: 44.5 % (ref 39.0–52.0)
HEMOGLOBIN: 15.8 g/dL (ref 13.0–17.0)
MCH: 31.3 pg (ref 26.0–34.0)
MCHC: 35.5 g/dL (ref 30.0–36.0)
MCV: 88.1 fL (ref 78.0–100.0)
Platelets: 156 10*3/uL (ref 150–400)
RBC: 5.05 MIL/uL (ref 4.22–5.81)
RDW: 14.1 % (ref 11.5–15.5)
WBC: 6 10*3/uL (ref 4.0–10.5)

## 2015-11-07 LAB — BASIC METABOLIC PANEL
ANION GAP: 6 (ref 5–15)
BUN: 23 mg/dL — ABNORMAL HIGH (ref 6–20)
CO2: 31 mmol/L (ref 22–32)
Calcium: 9.3 mg/dL (ref 8.9–10.3)
Chloride: 101 mmol/L (ref 101–111)
Creatinine, Ser: 1.04 mg/dL (ref 0.61–1.24)
GLUCOSE: 106 mg/dL — AB (ref 65–99)
POTASSIUM: 3.6 mmol/L (ref 3.5–5.1)
Sodium: 138 mmol/L (ref 135–145)

## 2015-11-07 LAB — SURGICAL PCR SCREEN
MRSA, PCR: NEGATIVE
Staphylococcus aureus: NEGATIVE

## 2015-11-07 NOTE — Patient Instructions (Signed)
[name]  11/07/2015   Your procedure is scheduled on: 11-15-15   Report to [facility] Main  Entrance take [facility]  elevators to 3rd floor to  [name] at 0700 AM.  Call this number if you have problems the morning of surgery [phone]   Bring Copy of Living Will.  Remember: ONLY 1 PERSON MAY GO WITH YOU TO SHORT STAY TO GET  READY MORNING OF YOUR SURGERY.  Do not eat food or drink liquids :After Midnight.     Take these medicines the morning of surgery with A SIP OF WATER: None. Bring cpap mask/tubing and nose piece. DO NOT TAKE ANY DIABETIC MEDICATIONS DAY OF YOUR SURGERY                               You may not have any metal on your body including hair pins and              piercings  Do not wear jewelry, make-up, lotions, powders or perfumes, deodorant             Do not wear nail polish.  Do not shave  48 hours prior to surgery.              Men may shave face and neck.   Do not bring valuables to the hospital. [place].  Contacts, dentures or bridgework may not be worn into surgery.  Leave suitcase in the car. After surgery it may be brought to your room.     Patients discharged the day of surgery will not be allowed to drive home.  Name and phone number of your driver: [name] -spouse [phone] cell  Special Instructions: N/A              Please read over the following fact sheets you were given: _____________________________________________________________________             [name] - Preparing for Surgery Before surgery, you can play an important role.  Because skin is not sterile, your skin needs to be as free of germs as possible.  You can reduce the number of germs on your skin by washing with CHG (chlorahexidine gluconate) soap before surgery.  CHG is an antiseptic cleaner which kills germs and bonds with the skin to continue killing germs even after washing. Please  DO NOT use if you have an allergy to CHG or antibacterial soaps.  If your skin becomes reddened/irritated stop using the CHG and inform your nurse when you arrive at Short Stay. Do not shave (including legs and underarms) for at least 48 hours prior to the first CHG shower.  You may shave your face/neck. Please follow these instructions carefully:  1.  Shower with CHG Soap the night before surgery and the  morning of Surgery.  2.  If you choose to wash your hair, wash your hair first as usual with your  normal  shampoo.  3.  After you shampoo, rinse your hair and body thoroughly to remove the  shampoo.                           4.  Use CHG as you would any  other liquid soap.  You can apply chg directly  to the skin and wash                       Gently with a scrungie or clean washcloth.  5.  Apply the CHG Soap to your body ONLY FROM THE NECK DOWN.   Do not use on face/ open                           Wound or open sores. Avoid contact with eyes, ears mouth and genitals (private parts).                       Wash face,  Genitals (private parts) with your normal soap.             6.  Wash thoroughly, paying special attention to the area where your surgery  will be performed.  7.  Thoroughly rinse your body with warm water from the neck down.  8.  DO NOT shower/wash with your normal soap after using and rinsing off  the CHG Soap.                9.  Pat yourself dry with a clean towel.            10.  Wear clean pajamas.            11.  Place clean sheets on your bed the night of your first shower and do not  sleep with pets. Day of Surgery : Do not apply any lotions/deodorants the morning of surgery.  Please wear clean clothes to the hospital/surgery center.  FAILURE TO FOLLOW THESE INSTRUCTIONS MAY RESULT IN THE CANCELLATION OF YOUR SURGERY PATIENT SIGNATURE_________________________________  NURSE  SIGNATURE__________________________________  ________________________________________________________________________   [name]  An incentive spirometer is a tool that can help keep your lungs clear and active. This tool measures how well you are filling your lungs with each breath. Taking long deep breaths may help reverse or decrease the chance of developing breathing (pulmonary) problems (especially infection) following:  A long period of time when you are unable to move or be active. BEFORE THE PROCEDURE   If the spirometer includes an indicator to show your best effort, your nurse or respiratory therapist will set it to a desired goal.  If possible, sit up straight or lean slightly forward. Try not to slouch.  Hold the incentive spirometer in an upright position. INSTRUCTIONS FOR USE   Sit on the edge of your bed if possible, or sit up as far as you can in bed or on a chair.  Hold the incentive spirometer in an upright position.  Breathe out normally.  Place the mouthpiece in your mouth and seal your lips tightly around it.  Breathe in slowly and as deeply as possible, raising the piston or the ball toward the top of the column.  Hold your breath for 3-5 seconds or for as long as possible. Allow the piston or ball to fall to the bottom of the column.  Remove the mouthpiece from your mouth and breathe out normally.  Rest for a few seconds and repeat Steps 1 through 7 at least 10 times every 1-2 hours when you are awake. Take your time and take a few normal breaths between deep breaths.  The spirometer may include an indicator to show your best effort. Use the indicator as a  goal to work toward during each repetition.  After each set of 10 deep breaths, practice coughing to be sure your lungs are clear. If you have an incision (the cut made at the time of surgery), support your incision when coughing by placing a pillow or rolled up towels firmly against it. Once  you are able to get out of bed, walk around indoors and cough well. You may stop using the incentive spirometer when instructed by your caregiver.  RISKS AND COMPLICATIONS  Take your time so you do not get dizzy or light-headed.  If you are in pain, you may need to take or ask for pain medication before doing incentive spirometry. It is harder to take a deep breath if you are having pain. AFTER USE  Rest and breathe slowly and easily.  It can be helpful to keep track of a log of your progress. Your caregiver can provide you with a simple table to help with this. If you are using the spirometer at home, follow these instructions: [place] IF:   You are having difficultly using the spirometer.  You have trouble using the spirometer as often as instructed.  Your pain medication is not giving enough relief while using the spirometer.  You develop fever of 100.5 F (38.1 C) or higher. SEEK IMMEDIATE MEDICAL CARE IF:   You cough up bloody sputum that had not been present before.  You develop fever of 102 F (38.9 C) or greater.  You develop worsening pain at or near the incision site. MAKE SURE YOU:   Understand these instructions.  Will watch your condition.  Will get help right away if you are not doing well or get worse. Document Released: 09/17/2006 Document Revised: 07/30/2011 Document Reviewed: 11/18/2006 Exit[employer] Patient Information 2014 Exit[employer], [place].   ________________________________________________________________________  WHAT IS A BLOOD TRANSFUSION? Blood Transfusion Information  A transfusion is the replacement of blood or some of its parts. Blood is made up of multiple cells which provide different functions.  Red blood cells carry oxygen and are used for blood loss replacement.  White blood cells fight against infection.  Platelets control bleeding.  Plasma helps clot blood.  Other blood products are available for specialized needs, such as  hemophilia or other clotting disorders. BEFORE THE TRANSFUSION  Who gives blood for transfusions?   Healthy volunteers who are fully evaluated to make sure their blood is safe. This is blood bank blood. Transfusion therapy is the safest it has ever been in the practice of medicine. Before blood is taken from a donor, a complete history is taken to make sure that person has no history of diseases nor engages in risky social behavior (examples are intravenous drug use or sexual activity with multiple partners). The donor's travel history is screened to minimize risk of transmitting infections, such as malaria. The donated blood is tested for signs of infectious diseases, such as HIV and hepatitis. The blood is then tested to be sure it is compatible with you in order to minimize the chance of a transfusion reaction. If you or a relative donates blood, this is often done in anticipation of surgery and is not appropriate for emergency situations. It takes many days to process the donated blood. RISKS AND COMPLICATIONS Although transfusion therapy is very safe and saves many lives, the main dangers of transfusion include:   Getting an infectious disease.  Developing a transfusion reaction. This is an allergic reaction to something in the blood you were given. Every  precaution is taken to prevent this. The decision to have a blood transfusion has been considered carefully by your caregiver before blood is given. Blood is not given unless the benefits outweigh the risks. AFTER THE TRANSFUSION  Right after receiving a blood transfusion, you will usually feel much better and more energetic. This is especially true if your red blood cells have gotten low (anemic). The transfusion raises the level of the red blood cells which carry oxygen, and this usually causes an energy increase.  The nurse administering the transfusion will monitor you carefully for complications. HOME CARE INSTRUCTIONS  No special  instructions are needed after a transfusion. You may find your energy is better. Speak with your caregiver about any limitations on activity for underlying diseases you may have. SEEK MEDICAL CARE IF:   Your condition is not improving after your transfusion.  You develop redness or irritation at the intravenous (IV) site. SEEK IMMEDIATE MEDICAL CARE IF:  Any of the following symptoms occur over the next 12 hours:  Shaking chills.  You have a temperature by mouth above 102 F (38.9 C), not controlled by medicine.  Chest, back, or muscle pain.  People around you feel you are not acting correctly or are confused.  Shortness of breath or difficulty breathing.  Dizziness and fainting.  You get a rash or develop hives.  You have a decrease in urine output.  Your urine turns a dark color or changes to pink, red, or brown. Any of the following symptoms occur over the next 10 days:  You have a temperature by mouth above 102 F (38.9 C), not controlled by medicine.  Shortness of breath.  Weakness after normal activity.  The white part of the eye turns yellow (jaundice).  You have a decrease in the amount of urine or are urinating less often.  Your urine turns a dark color or changes to pink, red, or brown. Document Released: 05/04/2000 Document Revised: 07/30/2011 Document Reviewed: 12/22/2007 [facility] Patient Information 2014 [place], [place].  _______________________________________________________________________

## 2015-11-07 NOTE — Pre-Procedure Instructions (Addendum)
EKG done today . Stress test report 09-19-15 with chart.Clearance note -Dr. Noah Delaine with chart.

## 2015-11-15 ENCOUNTER — Inpatient Hospital Stay (HOSPITAL_COMMUNITY): Payer: Medicare Other

## 2015-11-15 ENCOUNTER — Encounter (HOSPITAL_COMMUNITY)
Admission: RE | Disposition: A | Discharge status: Home / Self Care | Payer: Self-pay | Source: Ambulatory Visit | Attending: Orthopedic Surgery

## 2015-11-15 ENCOUNTER — Inpatient Hospital Stay (HOSPITAL_COMMUNITY)
Admission: RE | Admit: 2015-11-15 | Discharge: 2015-11-16 | DRG: 470 | Disposition: A | Discharge status: Home / Self Care | Payer: Medicare Other | Source: Ambulatory Visit | Attending: Orthopedic Surgery | Admitting: Orthopedic Surgery

## 2015-11-15 ENCOUNTER — Encounter (HOSPITAL_COMMUNITY): Payer: Self-pay | Admitting: Certified Registered Nurse Anesthetist

## 2015-11-15 ENCOUNTER — Inpatient Hospital Stay (HOSPITAL_COMMUNITY): Payer: Medicare Other | Admitting: Anesthesiology

## 2015-11-15 DIAGNOSIS — I10 Essential (primary) hypertension: Secondary | ICD-10-CM | POA: Diagnosis not present

## 2015-11-15 DIAGNOSIS — G4730 Sleep apnea, unspecified: Secondary | ICD-10-CM | POA: Diagnosis not present

## 2015-11-15 DIAGNOSIS — Z6830 Body mass index (BMI) 30.0-30.9, adult: Secondary | ICD-10-CM

## 2015-11-15 DIAGNOSIS — Z471 Aftercare following joint replacement surgery: Secondary | ICD-10-CM | POA: Diagnosis not present

## 2015-11-15 DIAGNOSIS — E785 Hyperlipidemia, unspecified: Secondary | ICD-10-CM | POA: Diagnosis present

## 2015-11-15 DIAGNOSIS — M1611 Unilateral primary osteoarthritis, right hip: Secondary | ICD-10-CM | POA: Diagnosis not present

## 2015-11-15 DIAGNOSIS — E669 Obesity, unspecified: Secondary | ICD-10-CM | POA: Diagnosis not present

## 2015-11-15 DIAGNOSIS — Z96649 Presence of unspecified artificial hip joint: Secondary | ICD-10-CM

## 2015-11-15 DIAGNOSIS — Z96641 Presence of right artificial hip joint: Secondary | ICD-10-CM | POA: Diagnosis not present

## 2015-11-15 DIAGNOSIS — M25551 Pain in right hip: Secondary | ICD-10-CM | POA: Diagnosis not present

## 2015-11-15 HISTORY — PX: TOTAL HIP ARTHROPLASTY: SHX124

## 2015-11-15 LAB — TYPE AND SCREEN
ABO/RH(D): O POS
ANTIBODY SCREEN: NEGATIVE

## 2015-11-15 LAB — ABO/RH: ABO/RH(D): O POS

## 2015-11-15 SURGERY — ARTHROPLASTY, HIP, TOTAL, ANTERIOR APPROACH
Anesthesia: Spinal | Site: Hip | Laterality: Right

## 2015-11-15 MED ORDER — VALSARTAN-HYDROCHLOROTHIAZIDE 80-12.5 MG PO TABS: 1.0000 | ORAL_TABLET | Freq: Every day | ORAL | Status: DC

## 2015-11-15 MED ORDER — DIPHENHYDRAMINE HCL 25 MG PO CAPS: 25.0000 mg | ORAL_CAPSULE | Freq: Four times a day (QID) | ORAL | Status: DC | PRN

## 2015-11-15 MED ORDER — DEXAMETHASONE SODIUM PHOSPHATE 10 MG/ML IJ SOLN
10.0000 mg | Freq: Once | INTRAMUSCULAR | Status: AC
  Administered 2015-11-16: 10 mg via INTRAVENOUS
  Filled 2015-11-15: qty 1

## 2015-11-15 MED ORDER — CEFAZOLIN SODIUM-DEXTROSE 2-4 GM/100ML-% IV SOLN
2.0000 g | INTRAVENOUS | Status: AC
  Administered 2015-11-15: 2 g via INTRAVENOUS
  Filled 2015-11-15: qty 100

## 2015-11-15 MED ORDER — PROPOFOL 10 MG/ML IV BOLUS
INTRAVENOUS | Status: AC
  Filled 2015-11-15: qty 60

## 2015-11-15 MED ORDER — FERROUS SULFATE 325 (65 FE) MG PO TABS
325.0000 mg | ORAL_TABLET | Freq: Three times a day (TID) | ORAL | Status: DC
  Administered 2015-11-16 (×2): 325 mg via ORAL
  Filled 2015-11-15 (×2): qty 1

## 2015-11-15 MED ORDER — PHENYLEPHRINE 40 MCG/ML (10ML) SYRINGE FOR IV PUSH (FOR BLOOD PRESSURE SUPPORT)
PREFILLED_SYRINGE | INTRAVENOUS | Status: AC
  Filled 2015-11-15: qty 10

## 2015-11-15 MED ORDER — ALUM & MAG HYDROXIDE-SIMETH 200-200-20 MG/5ML PO SUSP: 30.0000 mL | ORAL | Status: DC | PRN

## 2015-11-15 MED ORDER — METHOCARBAMOL 1000 MG/10ML IJ SOLN
500.0000 mg | Freq: Four times a day (QID) | INTRAVENOUS | Status: DC | PRN
  Administered 2015-11-15: 500 mg via INTRAVENOUS
  Filled 2015-11-15: qty 5
  Filled 2015-11-15: qty 550

## 2015-11-15 MED ORDER — CELECOXIB 200 MG PO CAPS
200.0000 mg | ORAL_CAPSULE | Freq: Two times a day (BID) | ORAL | Status: DC
  Administered 2015-11-15 – 2015-11-16 (×2): 200 mg via ORAL
  Filled 2015-11-15 (×2): qty 1

## 2015-11-15 MED ORDER — TRANEXAMIC ACID 1000 MG/10ML IV SOLN
1000.0000 mg | Freq: Once | INTRAVENOUS | Status: AC
  Administered 2015-11-15: 1000 mg via INTRAVENOUS
  Filled 2015-11-15: qty 10

## 2015-11-15 MED ORDER — MAGNESIUM CITRATE PO SOLN: 1.0000 | Freq: Once | ORAL | Status: DC | PRN

## 2015-11-15 MED ORDER — CHLORHEXIDINE GLUCONATE 4 % EX LIQD: 60.0000 mL | Freq: Once | CUTANEOUS | Status: DC

## 2015-11-15 MED ORDER — DEXAMETHASONE SODIUM PHOSPHATE 10 MG/ML IJ SOLN: 10.0000 mg | Freq: Once | INTRAMUSCULAR | Status: DC

## 2015-11-15 MED ORDER — PROPOFOL 500 MG/50ML IV EMUL
INTRAVENOUS | Status: DC | PRN
  Administered 2015-11-15: 25 ug/kg/min via INTRAVENOUS

## 2015-11-15 MED ORDER — HYDROCODONE-ACETAMINOPHEN 7.5-325 MG PO TABS
1.0000 | ORAL_TABLET | ORAL | Status: DC
  Administered 2015-11-15: 1 via ORAL
  Administered 2015-11-15 (×2): 2 via ORAL
  Administered 2015-11-16 (×4): 1 via ORAL
  Filled 2015-11-15: qty 1
  Filled 2015-11-15: qty 2
  Filled 2015-11-15 (×3): qty 1
  Filled 2015-11-15: qty 2
  Filled 2015-11-15: qty 1

## 2015-11-15 MED ORDER — CEFAZOLIN SODIUM-DEXTROSE 2-4 GM/100ML-% IV SOLN
2.0000 g | Freq: Four times a day (QID) | INTRAVENOUS | Status: AC
  Administered 2015-11-15 (×2): 2 g via INTRAVENOUS
  Filled 2015-11-15 (×2): qty 100

## 2015-11-15 MED ORDER — PROPOFOL 10 MG/ML IV BOLUS
INTRAVENOUS | Status: DC | PRN
  Administered 2015-11-15: 10 mg via INTRAVENOUS

## 2015-11-15 MED ORDER — SODIUM CHLORIDE 0.9 % IV SOLN
100.0000 mL/h | INTRAVENOUS | Status: DC
  Administered 2015-11-15 – 2015-11-16 (×2): 100 mL/h via INTRAVENOUS
  Filled 2015-11-15 (×4): qty 1000

## 2015-11-15 MED ORDER — MENTHOL 3 MG MT LOZG: 1.0000 | LOZENGE | OROMUCOSAL | Status: DC | PRN

## 2015-11-15 MED ORDER — ATORVASTATIN CALCIUM 20 MG PO TABS
20.0000 mg | ORAL_TABLET | Freq: Every day | ORAL | Status: DC
  Administered 2015-11-15: 20 mg via ORAL
  Filled 2015-11-15: qty 1

## 2015-11-15 MED ORDER — METHOCARBAMOL 500 MG PO TABS
500.0000 mg | ORAL_TABLET | Freq: Four times a day (QID) | ORAL | Status: DC | PRN
  Administered 2015-11-16: 500 mg via ORAL
  Filled 2015-11-15: qty 1

## 2015-11-15 MED ORDER — ASPIRIN EC 325 MG PO TBEC
325.0000 mg | DELAYED_RELEASE_TABLET | Freq: Two times a day (BID) | ORAL | Status: DC
  Administered 2015-11-16: 325 mg via ORAL
  Filled 2015-11-15: qty 1

## 2015-11-15 MED ORDER — METOCLOPRAMIDE HCL 5 MG/ML IJ SOLN: 5.0000 mg | Freq: Three times a day (TID) | INTRAMUSCULAR | Status: DC | PRN

## 2015-11-15 MED ORDER — LACTATED RINGERS IV SOLN
INTRAVENOUS | Status: DC
  Administered 2015-11-15 (×3): via INTRAVENOUS

## 2015-11-15 MED ORDER — DOCUSATE SODIUM 100 MG PO CAPS
100.0000 mg | ORAL_CAPSULE | Freq: Two times a day (BID) | ORAL | Status: DC
  Administered 2015-11-15 – 2015-11-16 (×2): 100 mg via ORAL
  Filled 2015-11-15 (×2): qty 1

## 2015-11-15 MED ORDER — PHENOL 1.4 % MT LIQD
1.0000 | OROMUCOSAL | Status: DC | PRN
  Filled 2015-11-15: qty 177

## 2015-11-15 MED ORDER — POLYETHYLENE GLYCOL 3350 17 G PO PACK
17.0000 g | PACK | Freq: Two times a day (BID) | ORAL | Status: DC
  Administered 2015-11-15 – 2015-11-16 (×2): 17 g via ORAL
  Filled 2015-11-15 (×2): qty 1

## 2015-11-15 MED ORDER — FENTANYL CITRATE (PF) 100 MCG/2ML IJ SOLN
INTRAMUSCULAR | Status: DC | PRN
  Administered 2015-11-15 (×2): 25 ug via INTRAVENOUS

## 2015-11-15 MED ORDER — DEXAMETHASONE SODIUM PHOSPHATE 10 MG/ML IJ SOLN
INTRAMUSCULAR | Status: AC
  Filled 2015-11-15: qty 1

## 2015-11-15 MED ORDER — ONDANSETRON HCL 4 MG PO TABS: 4.0000 mg | ORAL_TABLET | Freq: Four times a day (QID) | ORAL | Status: DC | PRN

## 2015-11-15 MED ORDER — KETOROLAC TROMETHAMINE 15 MG/ML IJ SOLN
15.0000 mg | Freq: Four times a day (QID) | INTRAMUSCULAR | Status: DC | PRN
Start: 2015-11-15 — End: 2015-11-16
  Administered 2015-11-15: 15 mg via INTRAVENOUS
  Filled 2015-11-15: qty 1

## 2015-11-15 MED ORDER — HYDROMORPHONE HCL 1 MG/ML IJ SOLN
INTRAMUSCULAR | Status: AC
  Administered 2015-11-15: 1 mg via INTRAVENOUS
  Filled 2015-11-15: qty 1

## 2015-11-15 MED ORDER — IRBESARTAN 75 MG PO TABS
75.0000 mg | ORAL_TABLET | Freq: Every day | ORAL | Status: DC
  Administered 2015-11-15: 75 mg via ORAL
  Filled 2015-11-15 (×2): qty 1

## 2015-11-15 MED ORDER — PROMETHAZINE HCL 25 MG/ML IJ SOLN: 6.2500 mg | INTRAMUSCULAR | Status: DC | PRN

## 2015-11-15 MED ORDER — HYDROMORPHONE HCL 1 MG/ML IJ SOLN
0.5000 mg | INTRAMUSCULAR | Status: DC | PRN
  Administered 2015-11-15: 1 mg via INTRAVENOUS
  Filled 2015-11-15: qty 1

## 2015-11-15 MED ORDER — BUPIVACAINE IN DEXTROSE 0.75-8.25 % IT SOLN
INTRATHECAL | Status: DC | PRN
  Administered 2015-11-15: 1.8 mL via INTRATHECAL

## 2015-11-15 MED ORDER — GLYCOPYRROLATE 0.2 MG/ML IJ SOLN
INTRAMUSCULAR | Status: AC
  Filled 2015-11-15: qty 1

## 2015-11-15 MED ORDER — METOCLOPRAMIDE HCL 5 MG PO TABS: 5.0000 mg | ORAL_TABLET | Freq: Three times a day (TID) | ORAL | Status: DC | PRN

## 2015-11-15 MED ORDER — LIDOCAINE HCL (CARDIAC) 20 MG/ML IV SOLN
INTRAVENOUS | Status: DC | PRN
Start: 2015-11-15 — End: 2015-11-15
  Administered 2015-11-15: 50 mg via INTRAVENOUS

## 2015-11-15 MED ORDER — ONDANSETRON HCL 4 MG/2ML IJ SOLN: 4.0000 mg | Freq: Four times a day (QID) | INTRAMUSCULAR | Status: DC | PRN

## 2015-11-15 MED ORDER — LIDOCAINE HCL (CARDIAC) 20 MG/ML IV SOLN
INTRAVENOUS | Status: AC
  Filled 2015-11-15: qty 5

## 2015-11-15 MED ORDER — PHENYLEPHRINE HCL 10 MG/ML IJ SOLN
INTRAMUSCULAR | Status: DC | PRN
  Administered 2015-11-15: 40 ug via INTRAVENOUS
  Administered 2015-11-15: 80 ug via INTRAVENOUS
  Administered 2015-11-15 (×3): 40 ug via INTRAVENOUS

## 2015-11-15 MED ORDER — HYDROMORPHONE HCL 1 MG/ML IJ SOLN
0.5000 mg | INTRAMUSCULAR | Status: DC | PRN
Start: 2015-11-15 — End: 2015-11-16

## 2015-11-15 MED ORDER — CEFAZOLIN SODIUM-DEXTROSE 2-4 GM/100ML-% IV SOLN
INTRAVENOUS | Status: AC
  Filled 2015-11-15: qty 100

## 2015-11-15 MED ORDER — HYDROMORPHONE HCL 1 MG/ML IJ SOLN
0.2500 mg | INTRAMUSCULAR | Status: DC | PRN
  Administered 2015-11-15 (×4): 0.5 mg via INTRAVENOUS

## 2015-11-15 MED ORDER — HYDROMORPHONE HCL 1 MG/ML IJ SOLN
INTRAMUSCULAR | Status: AC
  Filled 2015-11-15: qty 1

## 2015-11-15 MED ORDER — HYDROCHLOROTHIAZIDE 12.5 MG PO CAPS
12.5000 mg | ORAL_CAPSULE | Freq: Every day | ORAL | Status: DC
  Administered 2015-11-15: 12.5 mg via ORAL
  Filled 2015-11-15 (×2): qty 1

## 2015-11-15 MED ORDER — DEXAMETHASONE SODIUM PHOSPHATE 10 MG/ML IJ SOLN
INTRAMUSCULAR | Status: DC | PRN
  Administered 2015-11-15: 10 mg via INTRAVENOUS

## 2015-11-15 MED ORDER — BISACODYL 10 MG RE SUPP: 10.0000 mg | Freq: Every day | RECTAL | Status: DC | PRN

## 2015-11-15 MED ORDER — STERILE WATER FOR IRRIGATION IR SOLN
Status: DC | PRN
  Administered 2015-11-15: 2000 mL

## 2015-11-15 MED ORDER — SODIUM CHLORIDE 0.9 % IR SOLN
Status: DC | PRN
  Administered 2015-11-15: 1000 mL

## 2015-11-15 MED ORDER — FENTANYL CITRATE (PF) 100 MCG/2ML IJ SOLN
INTRAMUSCULAR | Status: AC
  Filled 2015-11-15: qty 2

## 2015-11-15 SURGICAL SUPPLY — 34 items
BAG ZIPLOCK 12X15 (MISCELLANEOUS) ×3 IMPLANT
CAPT HIP TOTAL 2 ×3 IMPLANT
CLOTH BEACON ORANGE TIMEOUT ST (SAFETY) ×3 IMPLANT
COVER PERINEAL POST (MISCELLANEOUS) ×3 IMPLANT
DRAPE STERI IOBAN 125X83 (DRAPES) ×3 IMPLANT
DRAPE U-SHAPE 47X51 STRL (DRAPES) ×6 IMPLANT
DRESSING AQUACEL AG SP 3.5X10 (GAUZE/BANDAGES/DRESSINGS) ×1 IMPLANT
DRSG AQUACEL AG SP 3.5X10 (GAUZE/BANDAGES/DRESSINGS) ×3
DURAPREP 26ML APPLICATOR (WOUND CARE) ×3 IMPLANT
ELECT REM PT RETURN 9FT ADLT (ELECTROSURGICAL) ×3
ELECTRODE REM PT RTRN 9FT ADLT (ELECTROSURGICAL) ×1 IMPLANT
GLOVE BIOGEL PI IND STRL 7.0 (GLOVE) ×1 IMPLANT
GLOVE BIOGEL PI IND STRL 7.5 (GLOVE) ×5 IMPLANT
GLOVE BIOGEL PI IND STRL 8.5 (GLOVE) ×1 IMPLANT
GLOVE BIOGEL PI INDICATOR 7.0 (GLOVE) ×2
GLOVE BIOGEL PI INDICATOR 7.5 (GLOVE) ×10
GLOVE BIOGEL PI INDICATOR 8.5 (GLOVE) ×2
GLOVE ECLIPSE 8.0 STRL XLNG CF (GLOVE) ×6 IMPLANT
GLOVE ORTHO TXT STRL SZ7.5 (GLOVE) ×3 IMPLANT
GLOVE SURG SS PI 7.0 STRL IVOR (GLOVE) ×6 IMPLANT
GLOVE SURG SS PI 7.5 STRL IVOR (GLOVE) ×3 IMPLANT
GOWN STRL REUS W/TWL LRG LVL3 (GOWN DISPOSABLE) ×6 IMPLANT
GOWN STRL REUS W/TWL XL LVL3 (GOWN DISPOSABLE) ×9 IMPLANT
HOLDER FOLEY CATH W/STRAP (MISCELLANEOUS) ×3 IMPLANT
LIQUID BAND (GAUZE/BANDAGES/DRESSINGS) ×3 IMPLANT
PACK ANTERIOR HIP CUSTOM (KITS) ×3 IMPLANT
SAW OSC TIP CART 19.5X105X1.3 (SAW) ×3 IMPLANT
SUT MNCRL AB 4-0 PS2 18 (SUTURE) ×3 IMPLANT
SUT VIC AB 1 CT1 36 (SUTURE) ×9 IMPLANT
SUT VIC AB 2-0 CT1 27 (SUTURE) ×4
SUT VIC AB 2-0 CT1 TAPERPNT 27 (SUTURE) ×2 IMPLANT
SUT VLOC 180 0 24IN GS25 (SUTURE) ×3 IMPLANT
TRAY FOLEY W/METER SILVER 16FR (SET/KITS/TRAYS/PACK) ×3 IMPLANT
YANKAUER SUCT BULB TIP 10FT TU (MISCELLANEOUS) ×3 IMPLANT

## 2015-11-15 NOTE — Transfer of Care (Signed)
Immediate Anesthesia Transfer of Care Note  Patient: [name]  Procedure(s) Performed: Procedure(s): RIGHT TOTAL HIP ARTHROPLASTY ANTERIOR APPROACH (Right)  Patient Location: PACU  Anesthesia Type:MAC and Spinal  Level of Consciousness: awake, alert , oriented and patient cooperative  Airway & Oxygen Therapy: Patient Spontanous Breathing and Patient connected to face mask oxygen  Post-op Assessment: Report given to RN and Post -op Vital signs reviewed and stable  Post vital signs: Reviewed and stable  Last Vitals:  Filed Vitals:   11/15/15 0652  BP: 136/82  Pulse: 69  Temp: 36.4 C  Resp: 18    Last Pain: There were no vitals filed for this visit.       Complications: No apparent anesthesia complications

## 2015-11-15 NOTE — Anesthesia Postprocedure Evaluation (Signed)
Anesthesia Post Note  Patient: [name]  Procedure(s) Performed: Procedure(s) (LRB): RIGHT TOTAL HIP ARTHROPLASTY ANTERIOR APPROACH (Right)  Patient location during evaluation: PACU Anesthesia Type: Spinal Level of consciousness: awake and alert Pain management: pain level controlled Vital Signs Assessment: post-procedure vital signs reviewed and stable Respiratory status: spontaneous breathing, nonlabored ventilation, respiratory function stable and patient connected to nasal cannula oxygen Cardiovascular status: blood pressure returned to baseline and stable Postop Assessment: no signs of nausea or vomiting Anesthetic complications: no    Last Vitals:  Filed Vitals:   11/15/15 1215 11/15/15 1230  BP: 128/93 126/79  Pulse: 51 60  Temp:    Resp: 10 11    Last Pain: There were no vitals filed for this visit.               [name] S

## 2015-11-15 NOTE — Anesthesia Procedure Notes (Signed)
Spinal Patient location during procedure: OR Staffing Performed by: anesthesiologist  Preanesthetic Checklist Completed: patient identified, site marked, surgical consent, pre-op evaluation, timeout performed, IV checked, risks and benefits discussed and monitors and equipment checked Spinal Block Patient position: sitting Prep: Betadine Patient monitoring: heart rate, continuous pulse ox and blood pressure Injection technique: single-shot Needle Needle type: Sprotte  Needle gauge: 22 G Needle length: 9 cm Additional Notes Expiration date of kit checked and confirmed. Patient tolerated procedure well, without complications.

## 2015-11-15 NOTE — Evaluation (Signed)
Physical Therapy Evaluation Patient Details Name: Kobain Lamirande MRN: [id number] DOB: 04/21/1941 Today's Date: 11/15/2015   History of Present Illness  R DATHA  Clinical Impression  The patient  C/O excruciating 10/10 pain about the Right thigh with attempts to sit up  At the edge of the bed. Did not tolerate  Sitting up. Assisted back to bed, repositioned, RN brought IV medication. Pt admitted with above diagnosis. Pt currently with functional limitations due to the deficits listed below (see PT Problem List). Pt will benefit from skilled PT to increase their independence and safety with mobility to allow discharge to the venue listed below.       Follow Up Recommendations No PT follow up;Supervision/Assistance - 24 hour (is Bundled)    Equipment Recommendations  None recommended by PT    Recommendations for Other Services       Precautions / Restrictions Precautions Precautions: Fall Restrictions Weight Bearing Restrictions: No      Mobility  Bed Mobility Overal bed mobility: Needs Assistance;+2 for physical assistance;+ 2 for safety/equipment Bed Mobility: Supine to Sit;Sit to Supine     Supine to sit: Total assist;+2 for physical assistance;+2 for safety/equipment;HOB elevated Sit to supine: Total assist;+2 for physical assistance;+2 for safety/equipment   General bed mobility comments: attempted to sit at the bed edge, assist with the R leg to slide toward the bed edge, use of bed pad to slide. Got  to  edge with  Left leg only over edge, unable to tolerate  placing Right leg over  and unable to sit up. Assisted back into the bed.  Transfers                 General transfer comment: unable  Ambulation/Gait                Stairs            Wheelchair Mobility    Modified Rankin (Stroke Patients Only)       Balance                                             Pertinent Vitals/Pain Pain Assessment: 0-10 Pain Score:  10-Worst pain ever Pain Location: R thigh and hip Pain Descriptors / Indicators: Contraction;Cramping;Grimacing;Guarding;Moaning;Operative site guarding;Spasm;Squeezing Pain Intervention(s): Limited activity within patient's tolerance;Monitored during session;Premedicated before session;Repositioned;Ice applied;RN gave pain meds during session;Patient requesting pain meds-RN notified    Home Living Family/patient expects to be discharged to:: Private residence Living Arrangements: Spouse/significant other Available Help at Discharge: Family Type of Home: House Home Access: Stairs to enter Entrance Stairs-Rails: Psychiatric nurse of Steps: [place]: One level Home Equipment: Environmental consultant - 2 wheels;Bedside commode;Shower seat      Prior Function Level of Independence: Independent               Hand Dominance        Extremity/Trunk Assessment   Upper Extremity Assessment: Defer to OT evaluation           Lower Extremity Assessment: RLE deficits/detail RLE Deficits / Details: did not tolerate hip flexion  in attmpts to sit at the edge of the bed. Appeared to have spasming.        Communication   Communication: No difficulties  Cognition Arousal/Alertness: Awake/alert Behavior During Therapy: WFL for tasks assessed/performed Overall Cognitive Status: Within Functional Limits for tasks assessed  General Comments      Exercises        Assessment/Plan    PT Assessment Patient needs continued PT services  PT Diagnosis Difficulty walking;Acute pain   PT Problem List Decreased strength;Decreased range of motion;Decreased activity tolerance;Decreased mobility;Decreased knowledge of precautions;Decreased safety awareness;Decreased knowledge of use of DME;Pain  PT Treatment Interventions DME instruction;Gait training;Stair training;Functional mobility training;Therapeutic activities;Therapeutic exercise;Patient/family  education   PT Goals (Current goals can be found in the Care Plan section) Acute Rehab PT Goals Patient Stated Goal: to get up and walk PT Goal Formulation: With patient/family Time For Goal Achievement: 11/18/15 Potential to Achieve Goals: Good    Frequency 7X/week   Barriers to discharge        Co-evaluation               End of Session   Activity Tolerance: Patient limited by pain Patient left: in bed;with call bell/phone within reach;with bed alarm set;with family/visitor present Nurse Communication: Mobility status;Patient requests pain meds         Time: [id number] PT Time Calculation (min) (ACUTE ONLY): 19 min   Charges:   PT Evaluation $PT Eval Low Complexity: 1 Procedure     PT G CodesClaretha Cooper 11/15/2015, 5:06 PM Tresa Endo PT [phone]

## 2015-11-15 NOTE — Op Note (Signed)
NAME:  [name]                ACCOUNT NO.: [id number]      MEDICAL RECORD NO.: [id number]      FACILITY:  [facility]      PHYSICIAN:  Paralee Cancel D  DATE OF BIRTH:  1940-12-02     DATE OF PROCEDURE:  11/15/2015                                 OPERATIVE REPORT         PREOPERATIVE DIAGNOSIS: Right  hip osteoarthritis.      POSTOPERATIVE DIAGNOSIS:  Right hip osteoarthritis.      PROCEDURE:  Right total hip replacement through an anterior approach   utilizing DePuy THR system, component size 34mm pinnacle cup, a size 36+4 neutral   Altrex liner, a size 5 Hi Tri Lock stem with a 36+1.5 delta ceramic   ball.      SURGEON:  Pietro Cassis. Alvan Dame, M.D.      ASSISTANT:  Danae Orleans, PA-C     ANESTHESIA:  Spinal.      SPECIMENS:  None.      COMPLICATIONS:  None.      BLOOD LOSS:  425 cc     DRAINS:  None.      INDICATION OF THE PROCEDURE:  [name] is a 75 y.o. male who had   presented to office for evaluation of right hip pain.  Radiographs revealed   progressive degenerative changes with bone-on-bone   articulation to the  hip joint.  The patient had painful limited range of   motion significantly affecting their overall quality of life.  The patient was failing to    respond to conservative measures, and at this point was ready   to proceed with more definitive measures.  The patient has noted progressive   degenerative changes in his hip, progressive problems and dysfunction   with regarding the hip prior to surgery.  Consent was obtained for   benefit of pain relief.  Specific risk of infection, DVT, component   failure, dislocation, need for revision surgery, as well discussion of   the anterior versus posterior approach were reviewed.  Consent was   obtained for benefit of anterior pain relief through an anterior   approach.      PROCEDURE IN DETAIL:  The patient was brought to operative theater.   Once adequate anesthesia, preoperative  antibiotics, 2gm of Ancef, 1 gm of Tranexamic Acid, and 10 mg of Decadron administered.   The patient was positioned supine on the OSI Hanna table.  Once adequate   padding of boney process was carried out, we had predraped out the hip, and  used fluoroscopy to confirm orientation of the pelvis and position.      The right hip was then prepped and draped from proximal iliac crest to   mid thigh with shower curtain technique.      Time-out was performed identifying the patient, planned procedure, and   extremity.     An incision was then made 2 cm distal and lateral to the   anterior superior iliac spine extending over the orientation of the   tensor fascia lata muscle and sharp dissection was carried down to the   fascia of the muscle and protractor placed in the soft tissues.      The fascia was then incised.  The muscle belly was identified and swept   laterally and retractor placed along the superior neck.  Following   cauterization of the circumflex vessels and removing some pericapsular   fat, a second cobra retractor was placed on the inferior neck.  A third   retractor was placed on the anterior acetabulum after elevating the   anterior rectus.  A L-capsulotomy was along the line of the   superior neck to the trochanteric fossa, then extended proximally and   distally.  Tag sutures were placed and the retractors were then placed   intracapsular.  We then identified the trochanteric fossa and   orientation of my neck cut, confirmed this radiographically   and then made a neck osteotomy with the femur on traction.  The femoral   head was removed without difficulty or complication.  Traction was let   off and retractors were placed posterior and anterior around the   acetabulum.      The labrum and foveal tissue were debrided.  I began reaming with a 88mm   reamer and reamed up to 61mm reamer with good bony bed preparation and a 66mm   cup was chosen.  The final 66mm Pinnacle cup  was then impacted under fluoroscopy  to confirm the depth of penetration and orientation with respect to   abduction.  A screw was placed followed by the hole eliminator.  The final   36+4 neutral Altrex liner was impacted with good visualized rim fit.  The cup was positioned anatomically within the acetabular portion of the pelvis.      At this point, the femur was rolled at 80 degrees.  Further capsule was   released off the inferior aspect of the femoral neck.  I then   released the superior capsule proximally.  The hook was placed laterally   along the femur and elevated manually and held in position with the bed   hook.  The leg was then extended and adducted with the leg rolled to 100   degrees of external rotation.  Once the proximal femur was fully   exposed, I used a box osteotome to set orientation.  I then began   broaching with the starting chili pepper broach and passed this by hand and then broached up to 5.  With the 5 broach in place I chose a high offset neck and did several trial reductions.  The offset was appropriate, leg lengths   appeared to be equal confirmed radiographically.   Given these findings, I went ahead and dislocated the hip, repositioned all   retractors and positioned the right hip in the extended and abducted position.  The final 5 Hi Tri Lock stem was   chosen and it was impacted down to the level of neck cut.  Based on this   and the trial reduction, a 36+1.5 delta ceramic ball was chosen and   impacted onto a clean and dry trunnion, and the hip was reduced.  The   hip had been irrigated throughout the case again at this point.  I did   reapproximate the superior capsular leaflet to the anterior leaflet   using #1 Vicryl.  The fascia of the   tensor fascia lata muscle was then reapproximated using #1 Vicryl and #0 V-lock sutures.  The   remaining wound was closed with 2-0 Vicryl and running 4-0 Monocryl.   The hip was cleaned, dried, and dressed  sterilely using Dermabond and   Aquacel dressing.  He was then brought   to recovery room in stable condition tolerating the procedure well.    Danae Orleans, PA-C was present for the entirety of the case involved from   preoperative positioning, perioperative retractor management, general   facilitation of the case, as well as primary wound closure as assistant.            Pietro Cassis Alvan Dame, M.D.        11/15/2015 11:14 AM

## 2015-11-15 NOTE — Anesthesia Preprocedure Evaluation (Signed)
Anesthesia Evaluation  Patient identified by MRN, date of birth, ID band Patient awake    Reviewed: Allergy & Precautions, NPO status , Patient's Chart, lab work & pertinent test results  Airway Mallampati: II  TM Distance: >3 FB Neck ROM: Full    Dental no notable dental hx.    Pulmonary sleep apnea and Continuous Positive Airway Pressure Ventilation ,    Pulmonary exam normal breath sounds clear to auscultation       Cardiovascular hypertension, Pt. on medications Normal cardiovascular exam Rhythm:Regular Rate:Normal     Neuro/Psych negative neurological ROS  negative psych ROS   GI/Hepatic negative GI ROS, Neg liver ROS,   Endo/Other  negative endocrine ROS  Renal/GU negative Renal ROS  negative genitourinary   Musculoskeletal negative musculoskeletal ROS (+)   Abdominal   Peds negative pediatric ROS (+)  Hematology negative hematology ROS (+)   Anesthesia Other Findings   Reproductive/Obstetrics negative OB ROS                             Anesthesia Physical Anesthesia Plan  ASA: III  Anesthesia Plan: Spinal   Post-op Pain Management:    Induction: Intravenous  Airway Management Planned: Simple Face Mask  Additional Equipment:   Intra-op Plan:   Post-operative Plan:   Informed Consent: I have reviewed the patients History and Physical, chart, labs and discussed the procedure including the risks, benefits and alternatives for the proposed anesthesia with the patient or authorized representative who has indicated his/her understanding and acceptance.   Dental advisory given  Plan Discussed with: CRNA and Surgeon  Anesthesia Plan Comments:         Anesthesia Quick Evaluation

## 2015-11-15 NOTE — Interval H&P Note (Signed)
History and Physical Interval Note:  11/15/2015 8:50 AM  [name]  has presented today for surgery, with the diagnosis of RIGHT HIP OA   The various methods of treatment have been discussed with the patient and family. After consideration of risks, benefits and other options for treatment, the patient has consented to  Procedure(s): RIGHT TOTAL HIP ARTHROPLASTY ANTERIOR APPROACH (Right) as a surgical intervention .  The patient's history has been reviewed, patient examined, no change in status, stable for surgery.  I have reviewed the patient's chart and labs.  Questions were answered to the patient's satisfaction.     Mauri Pole

## 2015-11-16 DIAGNOSIS — E669 Obesity, unspecified: Secondary | ICD-10-CM | POA: Diagnosis present

## 2015-11-16 LAB — CBC
HEMATOCRIT: 35.7 % — AB (ref 39.0–52.0)
HEMOGLOBIN: 12.7 g/dL — AB (ref 13.0–17.0)
MCH: 30.6 pg (ref 26.0–34.0)
MCHC: 35.6 g/dL (ref 30.0–36.0)
MCV: 86 fL (ref 78.0–100.0)
Platelets: 153 10*3/uL (ref 150–400)
RBC: 4.15 MIL/uL — AB (ref 4.22–5.81)
RDW: 13.7 % (ref 11.5–15.5)
WBC: 12.2 10*3/uL — ABNORMAL HIGH (ref 4.0–10.5)

## 2015-11-16 LAB — BASIC METABOLIC PANEL
Anion gap: 6 (ref 5–15)
BUN: 23 mg/dL — AB (ref 6–20)
CO2: 28 mmol/L (ref 22–32)
CREATININE: 1.12 mg/dL (ref 0.61–1.24)
Calcium: 8.3 mg/dL — ABNORMAL LOW (ref 8.9–10.3)
Chloride: 102 mmol/L (ref 101–111)
Glucose, Bld: 151 mg/dL — ABNORMAL HIGH (ref 65–99)
POTASSIUM: 3.8 mmol/L (ref 3.5–5.1)
SODIUM: 136 mmol/L (ref 135–145)

## 2015-11-16 MED ORDER — ASPIRIN 325 MG PO TBEC: 325.0000 mg | DELAYED_RELEASE_TABLET | Freq: Two times a day (BID) | ORAL | Status: AC

## 2015-11-16 MED ORDER — HYDROCODONE-ACETAMINOPHEN 7.5-325 MG PO TABS: 1.0000 | ORAL_TABLET | ORAL | Status: DC | PRN

## 2015-11-16 MED ORDER — FERROUS SULFATE 325 (65 FE) MG PO TABS: 325.0000 mg | ORAL_TABLET | Freq: Three times a day (TID) | ORAL | Status: DC

## 2015-11-16 MED ORDER — POLYETHYLENE GLYCOL 3350 17 G PO PACK: 17.0000 g | PACK | Freq: Two times a day (BID) | ORAL | Status: DC

## 2015-11-16 MED ORDER — TIZANIDINE HCL 4 MG PO TABS: 4.0000 mg | ORAL_TABLET | Freq: Four times a day (QID) | ORAL | Status: DC | PRN

## 2015-11-16 MED ORDER — DOCUSATE SODIUM 100 MG PO CAPS: 100.0000 mg | ORAL_CAPSULE | Freq: Two times a day (BID) | ORAL | Status: DC

## 2015-11-16 NOTE — Progress Notes (Signed)
     Subjective: 1 Day Post-Op Procedure(s) (LRB): RIGHT TOTAL HIP ARTHROPLASTY ANTERIOR APPROACH (Right)   Seen by Dr. Alvan Dame: Patient reports pain as mild, pain controlled.  Doing better today as compared to yesterday.   No events throughout the night. Ready to be discharged home if he does well with PT.  Objective:   VITALS:   Filed Vitals:   11/16/15 0141 11/16/15 0541  BP: 103/61 103/53  Pulse: 77 79  Temp: 97.7 F (36.5 C) 98.3 F (36.8 C)  Resp: 15 16    Dorsiflexion/Plantar flexion intact Incision: dressing C/D/I No cellulitis present Compartment soft  LABS  Recent Labs  11/16/15 0420  HGB 12.7*  HCT 35.7*  WBC 12.2*  PLT 153     Recent Labs  11/16/15 0420  NA 136  K 3.8  BUN 23*  CREATININE 1.12  GLUCOSE 151*     Assessment/Plan: 1 Day Post-Op Procedure(s) (LRB): RIGHT TOTAL HIP ARTHROPLASTY ANTERIOR APPROACH (Right) Foley cath d/c'ed Advance diet Up with therapy D/C IV fluids Discharge home Follow up in 2 weeks at [facility]. Follow up with OLIN,Cedrica Brune D in 2 weeks.  Contact information:  [facility] [address] [place] [id number]    Obese (BMI 30-39.9) Estimated body mass index is 30.29 kg/(m^2) as calculated from the following:   Height as of this encounter: 5\' 5"  (1.651 m).   Weight as of this encounter: 82.555 kg (182 lb). Patient also counseled that weight may inhibit the healing process Patient counseled that losing weight will help with future health issues         West Pugh. Gabriana Wilmott   PAC  11/16/2015, 8:16 AM

## 2015-11-16 NOTE — Care Management Note (Signed)
Case Management Note  Patient Details  Name: [name] MRN: [id number] Date of Birth: 04-30-1941  Subjective/Objective:                  RIGHT TOTAL HIP ARTHROPLASTY ANTERIOR APPROACH (Right)  Action/Plan: Discharge planning Expected Discharge Date:  11/15/15               Expected Discharge Plan:  Home/Self Care  In-House Referral:     Discharge planning Services  CM Consult  Post Acute Care Choice:    Choice offered to:  Patient, Spouse  DME Arranged:  N/A DME Agency:  NA  HH Arranged:  NA HH Agency:  NA  Status of Service:  Completed, signed off  If discussed at [place] of Stay Meetings, dates discussed:    Additional Comments: Cm met with pt and spouse in room to confirm pt will have NO home health services; family confirms.  Pt has both rolling walker and 3n1 from Spouse's surgery just one year ago.  No other CM needs were communicated. Dellie Catholic, RN 11/16/2015, 10:43 AM

## 2015-11-16 NOTE — Progress Notes (Signed)
Physical Therapy Treatment Patient Details Name: [name] MRN: [id number] DOB: 1940-08-27 Today's Date: 11/16/2015    History of Present Illness R DATHA    PT Comments    The patient has  Much less pain today . Did c/o feeling dizzy. Will see in PM for steps and ambulaltion.  Follow Up Recommendations  No PT follow up;Supervision/Assistance - 24 hour     Equipment Recommendations  None recommended by PT    Recommendations for Other Services       Precautions / Restrictions Precautions Precautions: Fall Restrictions Weight Bearing Restrictions: No    Mobility  Bed Mobility   Bed Mobility: Supine to Sit     Supine to sit: Min assist     General bed mobility comments: used the sheet to self move the R leg, assist with trunk  Transfers Overall transfer level: Needs assistance Equipment used: Rolling walker (2 wheeled) Transfers: Sit to/from Stand Sit to Stand: Min guard         General transfer comment: for safety. Cues for UE/LE placement  Ambulation/Gait Ambulation/Gait assistance: Supervision Ambulation Distance (Feet): 40 Feet Assistive device: Rolling walker (2 wheeled) Gait Pattern/deviations: Decreased stance time - right;Decreased step length - right     General Gait Details: patient c/o feeling dizzy. chasir brought up. BP 123/54   Stairs            Wheelchair Mobility    Modified Rankin (Stroke Patients Only)       Balance                                    Cognition Arousal/Alertness: Awake/alert Behavior During Therapy: WFL for tasks assessed/performed Overall Cognitive Status: Within Functional Limits for tasks assessed                      Exercises Total Joint Exercises Ankle Circles/Pumps: AROM;Both;10 reps Quad Sets: AROM;Both;10 reps Short Arc Quad: AROM;Right;10 reps Heel Slides: AAROM;Right;10 reps Hip ABduction/ADduction: AAROM;Right;10 reps    General Comments        Pertinent  Vitals/Pain Pain Score: 3  Pain Location: R hip Pain Descriptors / Indicators: Tender;Sore Pain Intervention(s): Limited activity within patient's tolerance;Monitored during session;Premedicated before session;Repositioned;Ice applied    Home Living Family/patient expects to be discharged to:: Private residence Living Arrangements: Spouse/significant other Available Help at Discharge: Family         Home Equipment: Gilford Rile - 2 wheels;Bedside commode;Shower seat      Prior Function Level of Independence: Independent          PT Goals (current goals can now be found in the care plan section) Acute Rehab PT Goals Patient Stated Goal: to get up and walk Progress towards PT goals: Progressing toward goals    Frequency  7X/week    PT Plan Current plan remains appropriate    Co-evaluation             End of Session   Activity Tolerance: Treatment limited secondary to medical complications (Comment) Patient left: in chair;with call bell/phone within reach;with family/visitor present     Time: [id number] PT Time Calculation (min) (ACUTE ONLY): 29 min  Charges:  $Gait Training: 8-22 mins $Therapeutic Exercise: 8-22 mins                    G Codes:      [name] 11/16/2015, 12:33 PM

## 2015-11-16 NOTE — Evaluation (Signed)
Occupational Therapy Evaluation Patient Details Name: [name] MRN: [id number] DOB: 1940/06/17 Today's Date: 11/16/2015    History of Present Illness R DATHA   Clinical Impression   This 75 year old man was admitted for the above sx. All education was completed.  No further OT is needed at this time    Follow Up Recommendations  No OT follow up;Supervision/Assistance - 24 hour    Equipment Recommendations  None recommended by OT    Recommendations for Other Services       Precautions / Restrictions Precautions Precautions: Fall Restrictions Weight Bearing Restrictions: No      Mobility Bed Mobility               General bed mobility comments: oob  Transfers Overall transfer level: Needs assistance Equipment used: Rolling walker (2 wheeled) Transfers: Sit to/from Stand Sit to Stand: Min guard         General transfer comment: for safety. Cues for UE/LE placement    Balance                                            ADL Overall ADL's : Needs assistance/impaired                         Toilet Transfer: Min guard;Ambulation;BSC;RW             General ADL Comments: ambulated to bathroom and practiced commode transfer.  Pt felt a little dizzy. Rested on 3:1 and brought recliner to door.  Reviewed sequence for stepping into tub when pt is ready; also discussed readiness.  Wife will assist with all adls.  Pt finds it more comfortable to sequence with walker, strong leg and weaker leg.  Reviewed hand placement from different surfaces. He plans to sleep in recliner initially     Vision     Perception     Praxis      Pertinent Vitals/Pain Pain Score: 5  Pain Location: R hip Pain Descriptors / Indicators: Sore Pain Intervention(s): Limited activity within patient's tolerance;Monitored during session;Premedicated before session;Repositioned;Ice applied     Hand Dominance     Extremity/Trunk Assessment Upper  Extremity Assessment Upper Extremity Assessment: Overall WFL for tasks assessed           Communication Communication Communication: No difficulties   Cognition Arousal/Alertness: Awake/alert Behavior During Therapy: WFL for tasks assessed/performed Overall Cognitive Status: Within Functional Limits for tasks assessed                     General Comments       Exercises       Shoulder Instructions      Home Living Family/patient expects to be discharged to:: Private residence Living Arrangements: Spouse/significant other Available Help at Discharge: Family               Bathroom Shower/Tub: Tub/shower unit (grab bars)   Bathroom Toilet: Standard     Home Equipment: Environmental consultant - 2 wheels;Bedside commode;Shower seat          Prior Functioning/Environment Level of Independence: Independent             OT Diagnosis: Acute pain   OT Problem List:     OT Treatment/Interventions:      OT Goals(Current goals can be found in the care plan section) Acute Rehab  OT Goals Patient Stated Goal: to get up and walk OT Goal Formulation: All assessment and education complete, DC therapy  OT Frequency:     Barriers to D/C:            Co-evaluation              End of Session    Activity Tolerance: Patient tolerated treatment well Patient left: in chair;with call bell/phone within reach   Time: 1043-1105 OT Time Calculation (min): 22 min Charges:  OT General Charges $OT Visit: 1 Procedure OT Evaluation $OT Eval Low Complexity: 1 Procedure G-Codes:    Shanielle Correll 2015/12/05, 11:16 AM Lesle Chris, OTR/L [phone] 12/05/15

## 2015-11-16 NOTE — Discharge Instructions (Signed)

## 2015-11-16 NOTE — Progress Notes (Signed)
Physical Therapy Treatment Patient Details Name: [name] MRN: [id number] DOB: [birth year] Today's Date: 11/16/2015    History of Present Illness [name]    PT Comments    POD # 1 pm session With family present assisted with amb and stair training.  Allowed "Hands on" instruction.  Mild c/o fatigue.  Required sitting rest break.  Pt achieved stair goal and is ready for D/c to home.   Follow Up Recommendations  No PT follow up;Supervision/Assistance - 24 hour     Equipment Recommendations  None recommended by PT    Recommendations for Other Services       Precautions / Restrictions Precautions Precautions: Fall Restrictions Weight Bearing Restrictions: No    Mobility  Bed Mobility               General bed mobility comments: Pt OOB in recliner  Transfers Overall transfer level: Needs assistance Equipment used: Rolling walker (2 wheeled) Transfers: Sit to/from Stand Sit to Stand: Supervision         General transfer comment: for safety. Cues for UE/LE placement  Ambulation/Gait Ambulation/Gait assistance: Supervision Ambulation Distance (Feet): 47 Feet Assistive device: Rolling walker (2 wheeled) Gait Pattern/deviations: Decreased stance time - right Gait velocity: decreased   General Gait Details: had pt amb with family under direction therapist.  Mild c/o fatigue requiring rest break.     Stairs Stairs: Yes Stairs assistance: Min assist Stair Management: One rail Right;Forwards Number of Stairs: 4 General stair comments: 25% VC's on proper sequencing and safety.  Performed with family under direction of therapist.    Wheelchair Mobility    Modified Rankin (Stroke Patients Only)       Balance                                    Cognition                            Exercises      General Comments        Pertinent Vitals/Pain      Home Living                      Prior Function             PT Goals (current goals can now be found in the care plan section) Progress towards PT goals: Progressing toward goals    Frequency       PT Plan Current plan remains appropriate    Co-evaluation             End of Session Equipment Utilized During Treatment: Gait belt   Patient left: in bed;with call bell/phone within reach;with family/visitor present     Time: [id number] PT Time Calculation (min) (ACUTE ONLY): 26 min  Charges:  $Gait Training: 8-22 mins $Therapeutic Activity: 8-22 mins                    G Codes:      Rica Koyanagi  PTA [facility]  Acute  Rehab Pager      [phone]

## 2015-11-27 NOTE — Discharge Summary (Signed)
Physician Discharge Summary  Patient ID: [name] MRN: [id number] DOB/AGE: [birth year] 75 y.o.  Admit date: 11/15/2015 Discharge date: 11/16/2015   Procedures:  Procedure(s) (LRB): RIGHT TOTAL HIP ARTHROPLASTY ANTERIOR APPROACH (Right)  Attending Physician:  Dr. Paralee Cancel   Admission Diagnoses:   Right hip primary OA / pain  Discharge Diagnoses:  Principal Problem:   S/P right THA, AA Active Problems:   Obese  Past Medical History  Diagnosis Date  . Colon polyps   . HLD (hyperlipidemia)   . History of nephrolithiasis     episodes x 6   . Carotid artery plaque     mild  . HTN (hypertension)   . Impaired hearing     bilateral hearing aids  . Sleep apnea     cpap used with nasal clip  . Arthritis     osteoarthritis right hip  . Erectile dysfunction   . GERD (gastroesophageal reflux disease)     history- no current problems    HPI:    [name], 75 y.o. male, has a history of pain and functional disability in the right hip(s) due to arthritis and patient has failed non-surgical conservative treatments for greater than 12 weeks to include NSAID's and/or analgesics, corticosteriod injections, use of assistive devices and activity modification. Onset of symptoms was gradual starting 1+ years ago with gradually worsening course since that time.The patient noted no past surgery on the right hip(s). Patient currently rates pain in the right hip at 8 out of 10 with activity. Patient has worsening of pain with activity and weight bearing, trendelenberg gait, pain that interfers with activities of daily living and pain with passive range of motion. Patient has evidence of periarticular osteophytes and joint space narrowing by imaging studies. This condition presents safety issues increasing the risk of falls. There is no current active infection. Risks, benefits and expectations were discussed with the patient. Risks including but not limited to the risk of anesthesia,  blood clots, nerve damage, blood vessel damage, failure of the prosthesis, infection and up to and including death. Patient understand the risks, benefits and expectations and wishes to proceed with surgery.   PCP: Thressa Sheller, MD   Discharged Condition: good  Hospital Course:  Patient underwent the above stated procedure on 11/15/2015. Patient tolerated the procedure well and brought to the recovery room in good condition and subsequently to the floor.  POD #1 BP: 103/53 ; Pulse: 79 ; Temp: 98.3 F (36.8 C) ; Resp: 16 Patient reports pain as mild, pain controlled. Doing better today as compared to yesterday. No events throughout the night. Ready to be discharged home if he does well with PT. Dorsiflexion/plantar flexion intact, incision: dressing C/D/I, no cellulitis present and compartment soft.   LABS  Basename    HGB     12.7  HCT     35.7    Discharge Exam: General appearance: alert, cooperative and no distress Extremities: Homans sign is negative, no sign of DVT, no edema, redness or tenderness in the calves or thighs and no ulcers, gangrene or trophic changes  Disposition: Home with follow up in 2 weeks   Follow-up Information    Follow up with Mauri Pole, MD. Schedule an appointment as soon as possible for a visit in 2 weeks.   Specialty:  Orthopedic Surgery   Contact information:   [address] [place] [address] [id number]       Discharge Instructions    Call MD / Call 911  Complete by:  As directed   If you experience chest pain or shortness of breath, CALL 911 and be transported to the hospital emergency room.  If you develope a fever above 101 F, pus (white drainage) or increased drainage or redness at the wound, or calf pain, call your surgeon's office.     Change dressing    Complete by:  As directed   Maintain surgical dressing until follow up in the clinic. If the edges start to pull up, may reinforce with tape. If the  dressing is no longer working, may remove and cover with gauze and tape, but must keep the area dry and clean.  Call with any questions or concerns.     Constipation Prevention    Complete by:  As directed   Drink plenty of fluids.  Prune juice may be helpful.  You may use a stool softener, such as Colace (over the counter) 100 mg twice a day.  Use MiraLax (over the counter) for constipation as needed.     Diet - low sodium heart healthy    Complete by:  As directed      Discharge instructions    Complete by:  As directed   Maintain surgical dressing until follow up in the clinic. If the edges start to pull up, may reinforce with tape. If the dressing is no longer working, may remove and cover with gauze and tape, but must keep the area dry and clean.  Follow up in 2 weeks at [facility]. Call with any questions or concerns.     Increase activity slowly as tolerated    Complete by:  As directed   Weight bearing as tolerated with assist device (walker, cane, etc) as directed, use it as long as suggested by your surgeon or therapist, typically at least 4-6 weeks.     TED hose    Complete by:  As directed   Use stockings (TED hose) for 2 weeks on both leg(s).  You may remove them at night for sleeping.             Medication List    STOP taking these medications        aspirin 81 MG tablet  Replaced by:  aspirin 325 MG EC tablet     meloxicam 7.5 MG tablet  Commonly known as:  MOBIC      TAKE these medications        ascorbic acid 1000 MG tablet  Commonly known as:  VITAMIN C  Take 1,000 mg by mouth daily.     aspirin 325 MG EC tablet  Take 1 tablet (325 mg total) by mouth 2 (two) times daily.     docusate sodium 100 MG capsule  Commonly known as:  COLACE  Take 1 capsule (100 mg total) by mouth 2 (two) times daily.     ferrous sulfate 325 (65 FE) MG tablet  Take 1 tablet (325 mg total) by mouth 3 (three) times daily after meals.     glucosamine-chondroitin  500-400 MG tablet  Take 1 tablet by mouth daily.     HYDROcodone-acetaminophen 7.5-325 MG tablet  Commonly known as:  NORCO  Take 1-2 tablets by mouth every 4 (four) hours as needed for moderate pain.     pantoprazole 40 MG tablet  Commonly known as:  PROTONIX  Take 1 tablet (40 mg total) by mouth daily.     polyethylene glycol packet  Commonly known as:  MIRALAX / GLYCOLAX  Take  17 g by mouth 2 (two) times daily.     sildenafil 20 MG tablet  Commonly known as:  REVATIO  Take 20 mg by mouth daily as needed.     simvastatin 80 MG tablet  Commonly known as:  ZOCOR  TAKE 1/2 TABLET BY MOUTH AT BEDTIME     tiZANidine 4 MG tablet  Commonly known as:  ZANAFLEX  Take 1 tablet (4 mg total) by mouth every 6 (six) hours as needed for muscle spasms.     valsartan-hydrochlorothiazide 80-12.5 MG tablet  Commonly known as:  DIOVAN-HCT  TAKE 1 TABLET EVERY DAY     Vitamin D3 2000 units Tabs  Take 1 tablet by mouth daily.         Signed: West Pugh. Adeliz Tonkinson   PA-C  11/27/2015, 9:53 AM

## 2015-11-30 DIAGNOSIS — Z96641 Presence of right artificial hip joint: Secondary | ICD-10-CM | POA: Diagnosis not present

## 2015-11-30 DIAGNOSIS — Z471 Aftercare following joint replacement surgery: Secondary | ICD-10-CM | POA: Diagnosis not present

## 2015-12-29 DIAGNOSIS — Z96641 Presence of right artificial hip joint: Secondary | ICD-10-CM | POA: Diagnosis not present

## 2015-12-29 DIAGNOSIS — Z471 Aftercare following joint replacement surgery: Secondary | ICD-10-CM | POA: Diagnosis not present

## 2016-01-17 ENCOUNTER — Other Ambulatory Visit: Payer: Self-pay

## 2016-03-12 DIAGNOSIS — F4323 Adjustment disorder with mixed anxiety and depressed mood: Secondary | ICD-10-CM | POA: Diagnosis not present

## 2016-03-23 DIAGNOSIS — H18603 Keratoconus, unspecified, bilateral: Secondary | ICD-10-CM | POA: Diagnosis not present

## 2016-03-23 DIAGNOSIS — H26491 Other secondary cataract, right eye: Secondary | ICD-10-CM | POA: Diagnosis not present

## 2016-03-23 DIAGNOSIS — D3132 Benign neoplasm of left choroid: Secondary | ICD-10-CM | POA: Diagnosis not present

## 2016-03-23 DIAGNOSIS — H52203 Unspecified astigmatism, bilateral: Secondary | ICD-10-CM | POA: Diagnosis not present

## 2016-04-05 DIAGNOSIS — F4323 Adjustment disorder with mixed anxiety and depressed mood: Secondary | ICD-10-CM | POA: Diagnosis not present

## 2016-04-19 DIAGNOSIS — F4323 Adjustment disorder with mixed anxiety and depressed mood: Secondary | ICD-10-CM | POA: Diagnosis not present

## 2016-05-06 LAB — HEPATIC FUNCTION PANEL
ALT: 61 — AB (ref 10–40)
AST: 22 (ref 14–40)
Alkaline Phosphatase: 64 (ref 25–125)
Bilirubin, Total: 0.7

## 2016-05-06 LAB — LIPID PANEL
CHOLESTEROL: 196 (ref 0–200)
HDL: 60 (ref 35–70)
LDL CALC: 112
LDl/HDL Ratio: 1.9
Triglycerides: 118 (ref 40–160)

## 2016-05-06 LAB — BASIC METABOLIC PANEL
BUN: 28 — AB (ref 4–21)
Creatinine: 1.1 (ref 0.6–1.3)
GLUCOSE: 112
Potassium: 4.5 (ref 3.4–5.3)
Sodium: 141 (ref 137–147)

## 2016-05-06 LAB — CBC AND DIFFERENTIAL
HEMATOCRIT: 47 (ref 41–53)
Hemoglobin: 16.6 (ref 13.5–17.5)
PLATELETS: 194 (ref 150–399)
WBC: 12

## 2016-05-06 LAB — TSH: TSH: 0.41 (ref 0.41–5.90)

## 2016-05-06 LAB — VITAMIN D 25 HYDROXY (VIT D DEFICIENCY, FRACTURES): Vit D, 25-Hydroxy: 57

## 2016-05-10 DIAGNOSIS — J45909 Unspecified asthma, uncomplicated: Secondary | ICD-10-CM | POA: Diagnosis not present

## 2016-05-16 DIAGNOSIS — I129 Hypertensive chronic kidney disease with stage 1 through stage 4 chronic kidney disease, or unspecified chronic kidney disease: Secondary | ICD-10-CM | POA: Diagnosis not present

## 2016-05-16 DIAGNOSIS — E559 Vitamin D deficiency, unspecified: Secondary | ICD-10-CM | POA: Diagnosis not present

## 2016-05-16 DIAGNOSIS — I10 Essential (primary) hypertension: Secondary | ICD-10-CM | POA: Diagnosis not present

## 2016-05-16 DIAGNOSIS — E785 Hyperlipidemia, unspecified: Secondary | ICD-10-CM | POA: Diagnosis not present

## 2016-05-19 ENCOUNTER — Emergency Department (INDEPENDENT_AMBULATORY_CARE_PROVIDER_SITE_OTHER)
Admission: EM | Admit: 2016-05-19 | Discharge: 2016-05-19 | Disposition: A | Discharge status: Home / Self Care | Payer: Medicare Other | Source: Home / Self Care | Attending: Family Medicine | Admitting: Family Medicine

## 2016-05-19 ENCOUNTER — Encounter: Payer: Self-pay | Admitting: Emergency Medicine

## 2016-05-19 ENCOUNTER — Emergency Department (INDEPENDENT_AMBULATORY_CARE_PROVIDER_SITE_OTHER): Payer: Medicare Other

## 2016-05-19 DIAGNOSIS — J101 Influenza due to other identified influenza virus with other respiratory manifestations: Secondary | ICD-10-CM | POA: Diagnosis not present

## 2016-05-19 DIAGNOSIS — R509 Fever, unspecified: Secondary | ICD-10-CM

## 2016-05-19 DIAGNOSIS — J209 Acute bronchitis, unspecified: Secondary | ICD-10-CM

## 2016-05-19 DIAGNOSIS — R05 Cough: Secondary | ICD-10-CM | POA: Diagnosis not present

## 2016-05-19 DIAGNOSIS — R0602 Shortness of breath: Secondary | ICD-10-CM

## 2016-05-19 LAB — POCT INFLUENZA A/B
Influenza A, POC: POSITIVE — AB
Influenza B, POC: NEGATIVE

## 2016-05-19 MED ORDER — ALBUTEROL SULFATE HFA 108 (90 BASE) MCG/ACT IN AERS: 1.0000 | INHALATION_SPRAY | Freq: Four times a day (QID) | RESPIRATORY_TRACT | 0 refills | Status: DC | PRN

## 2016-05-19 MED ORDER — OSELTAMIVIR PHOSPHATE 75 MG PO CAPS: 75.0000 mg | ORAL_CAPSULE | Freq: Two times a day (BID) | ORAL | 0 refills | Status: DC

## 2016-05-19 MED ORDER — AEROCHAMBER PLUS W/MASK MISC: 2 refills | Status: DC

## 2016-05-19 MED ORDER — DOXYCYCLINE HYCLATE 100 MG PO CAPS: 100.0000 mg | ORAL_CAPSULE | Freq: Two times a day (BID) | ORAL | 0 refills | Status: DC

## 2016-05-19 MED ORDER — PREDNISONE 20 MG PO TABS: ORAL_TABLET | ORAL | 0 refills | Status: DC

## 2016-05-19 NOTE — ED Provider Notes (Signed)
CSN: [id number]     Arrival date & time 05/19/16  1740 History   First MD Initiated Contact with Patient 05/19/16 1842     Chief Complaint  Patient presents with  . Cough  . Nasal Congestion   (Consider location/radiation/quality/duration/timing/severity/associated sxs/prior Treatment) HPI [name] is a 75 y.o. male presenting to UC with c/o persistent cough, congestion, chest tightness and body aches for about 1 week.  Pt was seen by his PCP on 05/10/16, started on azithromycin as well as prednisone 30mg  daily.  He completed the antibiotic and has one more dose of prednisone but has not felt much improvement. Pt's wife notes she was dx with influenza A this past week and wants husband to be tested.  Pt denies hx of asthma or COPD but notes last year he had cough and congestion for over 1 month and needed to f/u with a pulmonologist.  He does have an albuterol inhaler but has not used since last year.  Denies n/v/d. He has not had a chance to get the flu vaccine this season as he normally gets it the first week of December but he was already feeling bad so he was going to wait until he felt better.     Past Medical History:  Diagnosis Date  . Arthritis    osteoarthritis right hip  . Carotid artery plaque    mild  . Colon polyps   . Erectile dysfunction   . GERD (gastroesophageal reflux disease)    history- no current problems  . History of nephrolithiasis    episodes x 6   . HLD (hyperlipidemia)   . HTN (hypertension)   . Impaired hearing    bilateral hearing aids  . Sleep apnea    cpap used with nasal clip   Past Surgical History:  Procedure Laterality Date  . cataract Bilateral 2015  . CYSTOSCOPY W/ STONE MANIPULATION     x 2  . HEMORRHOID SURGERY    . INGUINAL HERNIA REPAIR Right   . LAPAROSCOPIC DRAINAGE RENAL CYST    . LITHOTRIPSY     x 1   . TOTAL HIP ARTHROPLASTY Right 11/15/2015   Procedure: RIGHT TOTAL HIP ARTHROPLASTY ANTERIOR APPROACH;  Surgeon: Paralee Cancel, MD;  Location: [facility];  Service: Orthopedics;  Laterality: Right;   Family History  Problem Relation Age of Onset  . Colon cancer Father   . Heart attack Mother 30  . Diabetes      1st degree relative   Social History  Substance Use Topics  . Smoking status: Never Smoker  . Smokeless tobacco: Never Used  . Alcohol use No    Review of Systems  Constitutional: Positive for chills. Negative for fever.  HENT: Positive for congestion, postnasal drip, rhinorrhea and sinus pressure. Negative for ear pain, sinus pain, sore throat, trouble swallowing and voice change.   Respiratory: Positive for cough, chest tightness and wheezing. Negative for shortness of breath.   Cardiovascular: Negative for chest pain and palpitations.  Gastrointestinal: Negative for abdominal pain, diarrhea, nausea and vomiting.  Musculoskeletal: Positive for arthralgias and myalgias. Negative for back pain.  Skin: Negative for rash.  Neurological: Positive for headaches. Negative for dizziness and light-headedness.  All other systems reviewed and are negative.   Allergies  Morphine and related  Home Medications   Prior to Admission medications   Medication Sig Start Date End Date Taking? Authorizing Provider  albuterol (PROVENTIL HFA;VENTOLIN HFA) 108 (90 Base) MCG/ACT inhaler Inhale 1-2 puffs into the  lungs every 6 (six) hours as needed for wheezing or shortness of breath. 05/19/16   Noland Fordyce, PA-C  ascorbic acid (VITAMIN C) 1000 MG tablet Take 1,000 mg by mouth daily.    Historical Provider, MD  Cholecalciferol (VITAMIN D3) 2000 units TABS Take 1 tablet by mouth daily.    Historical Provider, MD  docusate sodium (COLACE) 100 MG capsule Take 1 capsule (100 mg total) by mouth 2 (two) times daily. 11/16/15   Danae Orleans, PA-C  doxycycline (VIBRAMYCIN) 100 MG capsule Take 1 capsule (100 mg total) by mouth 2 (two) times daily. One po bid x 7 days 05/19/16   Noland Fordyce, PA-C  ferrous sulfate 325 (65  FE) MG tablet Take 1 tablet (325 mg total) by mouth 3 (three) times daily after meals. 11/16/15   Danae Orleans, PA-C  glucosamine-chondroitin 500-400 MG tablet Take 1 tablet by mouth daily.    Historical Provider, MD  HYDROcodone-acetaminophen (NORCO) 7.5-325 MG tablet Take 1-2 tablets by mouth every 4 (four) hours as needed for moderate pain. 11/16/15   Danae Orleans, PA-C  oseltamivir (TAMIFLU) 75 MG capsule Take 1 capsule (75 mg total) by mouth every 12 (twelve) hours. 05/19/16   Noland Fordyce, PA-C  pantoprazole (PROTONIX) 40 MG tablet Take 1 tablet (40 mg total) by mouth daily. Patient not taking: Reported on 11/01/2015 07/01/15   Tanda Rockers, MD  polyethylene glycol [facility] / Floria Raveling) packet Take 17 g by mouth 2 (two) times daily. 11/16/15   Danae Orleans, PA-C  predniSONE (DELTASONE) 20 MG tablet 3 tabs po day one, then 2 po daily x 4 days 05/19/16   Noland Fordyce, PA-C  sildenafil (REVATIO) 20 MG tablet Take 20 mg by mouth daily as needed.    Historical Provider, MD  simvastatin (ZOCOR) 80 MG tablet TAKE 1/2 TABLET BY MOUTH AT BEDTIME    Lisabeth Pick, MD  Spacer/Aero-Holding Chambers (AEROCHAMBER PLUS WITH MASK) inhaler Use as instructed 05/19/16   Noland Fordyce, PA-C  tiZANidine (ZANAFLEX) 4 MG tablet Take 1 tablet (4 mg total) by mouth every 6 (six) hours as needed for muscle spasms. 11/16/15   Danae Orleans, PA-C  valsartan-hydrochlorothiazide (DIOVAN-HCT) 80-12.5 MG per tablet TAKE 1 TABLET EVERY DAY 07/09/14   Owens Loffler, MD   Meds Ordered and Administered this Visit  Medications - No data to display  BP 112/63 (BP Location: Left Arm)   Pulse 67   Temp 97.8 F (36.6 C) (Oral)   Resp 16   Ht 5\' 6"  (1.676 m)   Wt 183 lb (83 kg)   SpO2 96%   BMI 29.54 kg/m  No data found.   Physical Exam  Constitutional: He appears well-developed and well-nourished. No distress.  Pt sitting in exam chair, NAD  HENT:  Head: Normocephalic and atraumatic.  Right Ear: Tympanic  membrane normal.  Left Ear: Tympanic membrane normal.  Nose: Rhinorrhea present.  Mouth/Throat: Uvula is midline and mucous membranes are normal. Posterior oropharyngeal erythema present. No oropharyngeal exudate, posterior oropharyngeal edema or tonsillar abscesses.  Eyes: Conjunctivae are normal. No scleral icterus.  Neck: Normal range of motion. Neck supple.  Cardiovascular: Normal rate, regular rhythm and normal heart sounds.   Pulmonary/Chest: Effort normal. No respiratory distress. He has wheezes. He has rhonchi. He has rales. He exhibits no tenderness.  Diffuse wheeze and rhonchi.  Rhonchi clears with cough. No respiratory distress. Intermittent cough on exam.  Musculoskeletal: Normal range of motion.  Neurological: He is alert.  Skin: Skin is warm and  dry. He is not diaphoretic.  Nursing note and vitals reviewed.   Urgent Care Course   Clinical Course     Procedures (including critical care time)  Labs Review Labs Reviewed  POCT INFLUENZA A/B - Abnormal; Notable for the following:       Result Value   Influenza A, POC Positive (*)    All other components within normal limits    Imaging Review Dg Chest 2 View  Result Date: 05/19/2016 CLINICAL DATA:  Cough and chest congestion. Shortness of breath. Fever. EXAM: CHEST  2 VIEW COMPARISON:  11/26/2013 FINDINGS: Chronic peribronchial thickening. Heart size and pulmonary vascularity are normal. No infiltrates or effusions. No acute bone abnormality. IMPRESSION: Bronchitic changes which could be recurrent or chronic. Electronically Signed   By: Lorriane Shire M.D.   On: 05/19/2016 19:22      MDM   1. Acute bronchitis, unspecified organism   2. Influenza A    Pt c/o persistent URI symptoms despite completing a course of azithromycin and prednisone.  Wife dx with influenza A last week and insists pt be tested today.  CXR: c/w bronchitis changes, recurrent vs chronic.  Flu: POSITIVE for influenza A  Rx: Tamiflu,  Doxycycline, albuterol inhaler with a spacer, prednisone 5 days (pt has done well on most recent prednisone does but believes he needs a stronger dose. He is not diabetic)  Encouraged fluids, rest, and f/u with PCP next week for recheck of symptoms to ensure he is getting better.    Noland Fordyce, PA-C 05/20/16 1113

## 2016-05-19 NOTE — ED Triage Notes (Signed)
Patient has had URI since 05/10/16; wife diagnosed with influenza last week.

## 2016-05-22 ENCOUNTER — Telehealth: Payer: Self-pay

## 2016-05-22 DIAGNOSIS — I129 Hypertensive chronic kidney disease with stage 1 through stage 4 chronic kidney disease, or unspecified chronic kidney disease: Secondary | ICD-10-CM | POA: Diagnosis not present

## 2016-05-22 DIAGNOSIS — N182 Chronic kidney disease, stage 2 (mild): Secondary | ICD-10-CM | POA: Diagnosis not present

## 2016-05-22 DIAGNOSIS — R7309 Other abnormal glucose: Secondary | ICD-10-CM | POA: Diagnosis not present

## 2016-05-22 DIAGNOSIS — E785 Hyperlipidemia, unspecified: Secondary | ICD-10-CM | POA: Diagnosis not present

## 2016-05-22 NOTE — Telephone Encounter (Signed)
Left message to call UC if questions or problems.

## 2016-06-18 DIAGNOSIS — F4323 Adjustment disorder with mixed anxiety and depressed mood: Secondary | ICD-10-CM | POA: Diagnosis not present

## 2016-07-12 DIAGNOSIS — J4530 Mild persistent asthma, uncomplicated: Secondary | ICD-10-CM | POA: Diagnosis not present

## 2016-07-12 DIAGNOSIS — J209 Acute bronchitis, unspecified: Secondary | ICD-10-CM | POA: Diagnosis not present

## 2016-09-21 DIAGNOSIS — H52203 Unspecified astigmatism, bilateral: Secondary | ICD-10-CM | POA: Diagnosis not present

## 2016-09-21 DIAGNOSIS — Z961 Presence of intraocular lens: Secondary | ICD-10-CM | POA: Diagnosis not present

## 2016-09-21 DIAGNOSIS — H26491 Other secondary cataract, right eye: Secondary | ICD-10-CM | POA: Diagnosis not present

## 2016-09-21 DIAGNOSIS — H18603 Keratoconus, unspecified, bilateral: Secondary | ICD-10-CM | POA: Diagnosis not present

## 2016-09-27 DIAGNOSIS — H26491 Other secondary cataract, right eye: Secondary | ICD-10-CM | POA: Diagnosis not present

## 2016-10-10 DIAGNOSIS — G4730 Sleep apnea, unspecified: Secondary | ICD-10-CM | POA: Diagnosis not present

## 2016-10-10 DIAGNOSIS — H903 Sensorineural hearing loss, bilateral: Secondary | ICD-10-CM | POA: Diagnosis not present

## 2016-10-10 DIAGNOSIS — H8102 Meniere's disease, left ear: Secondary | ICD-10-CM | POA: Diagnosis not present

## 2016-11-01 DIAGNOSIS — H8102 Meniere's disease, left ear: Secondary | ICD-10-CM | POA: Diagnosis not present

## 2016-11-01 DIAGNOSIS — H903 Sensorineural hearing loss, bilateral: Secondary | ICD-10-CM | POA: Diagnosis not present

## 2016-11-01 DIAGNOSIS — G4730 Sleep apnea, unspecified: Secondary | ICD-10-CM | POA: Diagnosis not present

## 2016-12-14 ENCOUNTER — Ambulatory Visit (INDEPENDENT_AMBULATORY_CARE_PROVIDER_SITE_OTHER): Payer: Medicare Other | Admitting: Family Medicine

## 2016-12-14 ENCOUNTER — Encounter: Payer: Self-pay | Admitting: Family Medicine

## 2016-12-14 VITALS — BP 136/67 | HR 75 | Ht 66.0 in | Wt 179.0 lb

## 2016-12-14 DIAGNOSIS — I10 Essential (primary) hypertension: Secondary | ICD-10-CM

## 2016-12-14 DIAGNOSIS — Z125 Encounter for screening for malignant neoplasm of prostate: Secondary | ICD-10-CM

## 2016-12-14 DIAGNOSIS — E782 Mixed hyperlipidemia: Secondary | ICD-10-CM

## 2016-12-14 DIAGNOSIS — W57XXXA Bitten or stung by nonvenomous insect and other nonvenomous arthropods, initial encounter: Secondary | ICD-10-CM

## 2016-12-14 DIAGNOSIS — S30860A Insect bite (nonvenomous) of lower back and pelvis, initial encounter: Secondary | ICD-10-CM

## 2016-12-14 NOTE — Progress Notes (Signed)
Subjective:    Patient ID: [name], male    DOB: 07-26-1940, 76 y.o.   MRN: [id number]  HPI 76 year old male here today to establish care. I have been seeing his wife several years that the patient and they are buying a home in the local area. He has a history of hypertension and hyperlipidemia. Though no prior history of coronary artery disease.  Hypertension- Pt denies chest pain, SOB, dizziness, or heart palpitations.  Taking meds as directed w/o problems.  Denies medication side effects.    Hyperlipidemia-tolerating statin well without any side effects.  He did want to let me know that about a week ago he was bit by to ticks. One was found on his upper mid back and one on his right hip just along his old surgical scar. He has not had any joint pain fevers chills sweats or rashes but did bring in the ticks for me to examine.   Review of Systems  Constitutional: Negative for diaphoresis, fever and unexpected weight change.  HENT: Negative for hearing loss, rhinorrhea, sneezing and tinnitus.   Eyes: Negative for visual disturbance.  Respiratory: Negative for cough and wheezing.   Cardiovascular: Negative for chest pain and palpitations.  Gastrointestinal: Negative for blood in stool, diarrhea, nausea and vomiting.  Genitourinary: Negative for discharge and dysuria.  Musculoskeletal: Negative for arthralgias and myalgias.  Skin: Negative for rash.  Neurological: Negative for headaches.  Hematological: Negative for adenopathy.  Psychiatric/Behavioral: Negative for dysphoric mood and sleep disturbance. The patient is not nervous/anxious.      BP 136/67   Pulse 75   Ht 5\' 6"  (1.676 m)   Wt 179 lb (81.2 kg)   BMI 28.89 kg/m     Allergies  Allergen Reactions  . Morphine And Related Nausea And Vomiting    Past Medical History:  Diagnosis Date  . Arthritis    osteoarthritis right hip  . Carotid artery plaque    mild  . Colon polyps   . Erectile dysfunction   . GERD  (gastroesophageal reflux disease)    history- no current problems  . History of nephrolithiasis    episodes x 6   . HLD (hyperlipidemia)   . HTN (hypertension)   . Impaired hearing    bilateral hearing aids  . Sleep apnea    cpap used with nasal clip    Past Surgical History:  Procedure Laterality Date  . cataract Bilateral 2015  . CYSTOSCOPY W/ STONE MANIPULATION     x 2  . HEMORRHOID SURGERY    . INGUINAL HERNIA REPAIR Right   . LAPAROSCOPIC DRAINAGE RENAL CYST    . LITHOTRIPSY     x 1   . TOTAL HIP ARTHROPLASTY Right 11/15/2015   Procedure: RIGHT TOTAL HIP ARTHROPLASTY ANTERIOR APPROACH;  Surgeon: Paralee Cancel, MD;  Location: [facility];  Service: Orthopedics;  Laterality: Right;    Social History   Social History  . Marital status: Married    Spouse name: [name]   . Number of children: N/A  . Years of education: N/A   Occupational History  . Retired     Social History Main Topics  . Smoking status: Never Smoker  . Smokeless tobacco: Never Used  . Alcohol use No  . Drug use: No  . Sexual activity: Yes   Other Topics Concern  . Not on file   Social History Narrative   Married.  No regular exercise.  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 Colon cancer Father   . Heart attack Mother 35  . Diabetes Unknown        1st degree relative    Outpatient Encounter Prescriptions as of 12/14/2016  Medication Sig  . albuterol (PROVENTIL HFA;VENTOLIN HFA) 108 (90 Base) MCG/ACT inhaler Inhale 1-2 puffs into the lungs every 6 (six) hours as needed for wheezing or shortness of breath.  Marland Kitchen ascorbic acid (VITAMIN C) 1000 MG tablet Take 1,000 mg by mouth daily.  . Cholecalciferol (VITAMIN D3) 2000 units TABS Take 1 tablet by mouth daily.  Marland Kitchen  docusate sodium (COLACE) 100 MG capsule Take 1 capsule (100 mg total) by mouth 2 (two) times daily.  . ferrous sulfate 325 (65 FE) MG tablet Take 1 tablet (325 mg total) by mouth 3 (three) times daily after meals.  Marland Kitchen glucosamine-chondroitin 500-400 MG tablet Take 1 tablet by mouth daily.  . polyethylene glycol (MIRALAX / GLYCOLAX) packet Take 17 g by mouth 2 (two) times daily.  . predniSONE (DELTASONE) 20 MG tablet 3 tabs po day one, then 2 po daily x 4 days  . sildenafil (REVATIO) 20 MG tablet Take 20 mg by mouth daily as needed.  . simvastatin (ZOCOR) 80 MG tablet TAKE 1/2 TABLET BY MOUTH AT BEDTIME  . Spacer/Aero-Holding Chambers (AEROCHAMBER PLUS WITH MASK) inhaler Use as instructed  . tiZANidine (ZANAFLEX) 4 MG tablet Take 1 tablet (4 mg total) by mouth every 6 (six) hours as needed for muscle spasms.  . valsartan-hydrochlorothiazide (DIOVAN-HCT) 80-12.5 MG per tablet TAKE 1 TABLET EVERY DAY  . [DISCONTINUED] doxycycline (VIBRAMYCIN) 100 MG capsule Take 1 capsule (100 mg total) by mouth 2 (two) times daily. One po bid x 7 days  . [DISCONTINUED] HYDROcodone-acetaminophen (NORCO) 7.5-325 MG tablet Take 1-2 tablets by mouth every 4 (four) hours as needed for moderate pain.  . [DISCONTINUED] oseltamivir (TAMIFLU) 75 MG capsule Take 1 capsule (75 mg total) by mouth every 12 (twelve) hours.  . [DISCONTINUED] pantoprazole (PROTONIX) 40 MG tablet Take 1 tablet (40 mg total) by mouth daily. (Patient not taking: Reported on 11/01/2015)   No facility-administered encounter medications on file as of 12/14/2016.           Objective:   Physical Exam  Constitutional: He is oriented to person, place, and time. He appears well-developed and well-nourished.  HENT:  Head: Normocephalic and atraumatic.  Right Ear: External ear normal.  Left Ear: External ear normal.  Nose: Nose normal.  Mouth/Throat: Oropharynx is clear and moist.  TMs and canals are clear.   Eyes: Pupils are equal, round, and  reactive to light. Conjunctivae and EOM are normal.  Neck: Neck supple. No thyromegaly present.  Cardiovascular: Normal rate and normal heart sounds.   Pulmonary/Chest: Effort normal and breath sounds normal.  Lymphadenopathy:    He has no cervical adenopathy.  Neurological: He is alert and oriented to person, place, and time.  Skin: Skin is warm and dry.  He did have a just slightly erythematous small nodule on the mid upper back and a smaller erythematous nodule just along the upper end of the scar over his right hip. No sign of infection or cellulitis.  Psychiatric: He has a normal mood and affect.        Assessment &  Plan:  Hypertension-well-controlled. Continue current regimen. Follow-up in 6 months. Due for BMP today.  Prostate cancer screening-due for PSA. Lab slip order today.  Hyperlipidemia-doing well on simvastatin. We'll recheck lipids in 6 months.  Tikc bite 2-gave reassurance. No other systemic symptoms. No need for antibiotic treatment at this point in time. Did encourage him to monitor for new symptoms for at least another 7 days and he does experience anything to please call us back and we can send over a prescription for doxycycline.

## 2016-12-15 LAB — BASIC METABOLIC PANEL WITH GFR
BUN: 22 mg/dL (ref 7–25)
CALCIUM: 9.9 mg/dL (ref 8.6–10.3)
CO2: 26 mmol/L (ref 20–31)
CREATININE: 1.09 mg/dL (ref 0.70–1.18)
Chloride: 96 mmol/L — ABNORMAL LOW (ref 98–110)
GFR, EST AFRICAN AMERICAN: 76 mL/min (ref >=60)
GFR, EST NON AFRICAN AMERICAN: 66 mL/min (ref >=60)
GLUCOSE: 82 mg/dL (ref 65–99)
Potassium: 3.4 mmol/L — ABNORMAL LOW (ref 3.5–5.3)
SODIUM: 138 mmol/L (ref 135–146)

## 2016-12-15 LAB — PSA: PSA: 1.5 ng/mL (ref <=4.0)

## 2016-12-29 ENCOUNTER — Emergency Department (INDEPENDENT_AMBULATORY_CARE_PROVIDER_SITE_OTHER)
Admission: EM | Admit: 2016-12-29 | Discharge: 2016-12-29 | Disposition: A | Discharge status: Home / Self Care | Payer: Medicare Other | Source: Home / Self Care | Attending: Family Medicine | Admitting: Family Medicine

## 2016-12-29 DIAGNOSIS — J069 Acute upper respiratory infection, unspecified: Secondary | ICD-10-CM | POA: Diagnosis not present

## 2016-12-29 DIAGNOSIS — R0789 Other chest pain: Secondary | ICD-10-CM

## 2016-12-29 MED ORDER — PREDNISONE 20 MG PO TABS: ORAL_TABLET | ORAL | 0 refills | Status: DC

## 2016-12-29 MED ORDER — ALBUTEROL SULFATE HFA 108 (90 BASE) MCG/ACT IN AERS: 1.0000 | INHALATION_SPRAY | Freq: Four times a day (QID) | RESPIRATORY_TRACT | 0 refills | Status: DC | PRN

## 2016-12-29 MED ORDER — AZITHROMYCIN 250 MG PO TABS: 250.0000 mg | ORAL_TABLET | Freq: Every day | ORAL | 0 refills | Status: DC

## 2016-12-29 NOTE — Discharge Instructions (Signed)
°  Your symptoms are likely due to a virus such as the common cold, however, if you developing worsening chest congestion with shortness of breath, persistent fever (> 100.4*F) for 3 days, or symptoms not improving in 4-5 days, you may fill the antibiotic (azithromycin).  If you do fill the antibiotic,  please take antibiotics as prescribed and be sure to complete entire course even if you start to feel better to ensure infection does not come back. ° °

## 2016-12-29 NOTE — ED Provider Notes (Signed)
[name]    CSN: [id number] Arrival date & time: 12/29/16  1018     History   Chief Complaint Chief Complaint  Patient presents with  . Cough    x 3 days    HPI [name] is a 76 y.o. male.   HPI  [name] is a 76 y.o. male presenting to UC with c/o 3 days of gradually worsening non-productive cough with chest tightness and heaviness but denies SOB.  He has needed to use prednisone and inhalers in the past when he gets sick.  He notes his wife was recently on prednisone and is currently on an antibiotic.  He has been taking OTC Mucinex DM at night. Denies fever, chills, n/v/d.    Past Medical History:  Diagnosis Date  . Arthritis    osteoarthritis right hip  . Carotid artery plaque    mild  . Colon polyps   . Erectile dysfunction   . GERD (gastroesophageal reflux disease)    history- no current problems  . History of nephrolithiasis    episodes x 6   . HLD (hyperlipidemia)   . HTN (hypertension)   . Impaired hearing    bilateral hearing aids  . Sleep apnea    cpap used with nasal clip    Patient Active Problem List   Diagnosis Date Noted  . S/P right THA, AA 11/15/2015  . Cough variant asthma 04/03/2015  . Upper airway cough syndrome 03/10/2015  . Groin strain-right 11/17/2013  . HYPERLIPIDEMIA 02/12/2007  . Essential hypertension 02/12/2007  . COLONIC POLYPS, HX OF 02/12/2007  . NEPHROLITHIASIS, HX OF 02/12/2007    Past Surgical History:  Procedure Laterality Date  . cataract Bilateral 2015  . CYSTOSCOPY W/ STONE MANIPULATION     x 2  . HEMORRHOID SURGERY    . INGUINAL HERNIA REPAIR Right   . LAPAROSCOPIC DRAINAGE RENAL CYST    . LITHOTRIPSY     x 1   . TOTAL HIP ARTHROPLASTY Right 11/15/2015   Procedure: RIGHT TOTAL HIP ARTHROPLASTY ANTERIOR APPROACH;  Surgeon: Paralee Cancel, MD;  Location: [facility];  Service: Orthopedics;  Laterality: Right;       Home Medications    Prior to Admission medications   Medication Sig  Start Date End Date Taking? Authorizing Provider  ascorbic acid (VITAMIN C) 1000 MG tablet Take 1,000 mg by mouth daily.   Yes [provider]  Cholecalciferol (VITAMIN D3) 2000 units TABS Take 1 tablet by mouth daily.   Yes [provider]  docusate sodium (COLACE) 100 MG capsule Take 1 capsule (100 mg total) by mouth 2 (two) times daily. 11/16/15  Yes Babish, Rodman Key, PA-C  ferrous sulfate 325 (65 FE) MG tablet Take 1 tablet (325 mg total) by mouth 3 (three) times daily after meals. 11/16/15  Yes Danae Orleans, PA-C  glucosamine-chondroitin 500-400 MG tablet Take 1 tablet by mouth daily.   Yes [provider]  polyethylene glycol (MIRALAX / GLYCOLAX) packet Take 17 g by mouth 2 (two) times daily. 11/16/15  Yes Babish, Rodman Key, PA-C  predniSONE (DELTASONE) 20 MG tablet 3 tabs po day one, then 2 po daily x 4 days 05/19/16  Yes Amandajo Gonder O, PA-C  sildenafil (REVATIO) 20 MG tablet Take 20 mg by mouth daily as needed.   Yes [provider]  simvastatin (ZOCOR) 80 MG tablet TAKE 1/2 TABLET BY MOUTH AT BEDTIME   Yes Swords, Darrick Penna, MD  Spacer/Aero-Holding Chambers (AEROCHAMBER PLUS WITH MASK) inhaler Use  as instructed 05/19/16  Yes Taahir Grisby O, PA-C  tiZANidine (ZANAFLEX) 4 MG tablet Take 1 tablet (4 mg total) by mouth every 6 (six) hours as needed for muscle spasms. 11/16/15  Yes Babish, Rodman Key, PA-C  valsartan-hydrochlorothiazide (DIOVAN-HCT) 80-12.5 MG per tablet TAKE 1 TABLET EVERY DAY 07/09/14  Yes Copland, Frederico Hamman, MD  albuterol (PROVENTIL HFA;VENTOLIN HFA) 108 (90 Base) MCG/ACT inhaler Inhale 1-2 puffs into the lungs every 6 (six) hours as needed for wheezing or shortness of breath. 12/29/16   Noe Gens, PA-C  azithromycin (ZITHROMAX) 250 MG tablet Take 1 tablet (250 mg total) by mouth daily. Take first 2 tablets together, then 1 every day until finished. 12/29/16   Noe Gens, PA-C  predniSONE (DELTASONE) 20 MG tablet 3 tabs po daily x 3 days, then 2  tabs x 3 days, then 1.5 tabs x 3 days, then 1 tab x 3 days, then 0.5 tabs x 3 days 12/29/16   Noe Gens, PA-C    Family History Family History  Problem Relation Age of Onset  . Colon cancer Father   . Heart attack Mother 66  . Diabetes Unknown        1st degree relative    Social History Social History  Substance Use Topics  . Smoking status: Never Smoker  . Smokeless tobacco: Never Used  . Alcohol use No     Allergies   Morphine and related   Review of Systems Review of Systems  Constitutional: Negative for chills and fever.  HENT: Positive for congestion. Negative for ear pain, sore throat, trouble swallowing and voice change.   Respiratory: Positive for cough and chest tightness. Negative for shortness of breath.   Cardiovascular: Negative for chest pain and palpitations.  Gastrointestinal: Negative for abdominal pain, diarrhea, nausea and vomiting.  Musculoskeletal: Negative for arthralgias, back pain and myalgias.  Skin: Negative for rash.     Physical Exam Triage Vital Signs ED Triage Vitals  Enc Vitals Group     BP 12/29/16 1038 128/80     Pulse Rate 12/29/16 1038 69     Resp --      Temp 12/29/16 1038 98.5 F (36.9 C)     Temp Source 12/29/16 1038 Oral     SpO2 12/29/16 1038 97 %     Weight 12/29/16 1039 180 lb (81.6 kg)     Height 12/29/16 1039 5\' 6"  (1.676 m)     Head Circumference --      Peak Flow --      Pain Score 12/29/16 1039 0     Pain Loc --      Pain Edu? --      Excl. in [place]? --    No data found.   Updated Vital Signs BP 128/80 (BP Location: Left Arm)   Pulse 69   Temp 98.5 F (36.9 C) (Oral)   Ht 5\' 6"  (1.676 m)   Wt 180 lb (81.6 kg)   SpO2 97%   BMI 29.05 kg/m      Physical Exam  Constitutional: He is oriented to person, place, and time. He appears well-developed and well-nourished. No distress.  HENT:  Head: Normocephalic and atraumatic.  Right Ear: Tympanic membrane normal.  Left Ear: Tympanic membrane normal.    Nose: Nose normal.  Mouth/Throat: Uvula is midline, oropharynx is clear and moist and mucous membranes are normal.  Eyes: EOM are normal.  Neck: Normal range of motion. Neck supple.  Cardiovascular: Normal rate and regular rhythm.  Pulmonary/Chest: Effort normal and breath sounds normal. No respiratory distress. He has no wheezes. He has no rales.  Musculoskeletal: Normal range of motion.  Neurological: He is alert and oriented to person, place, and time.  Skin: Skin is warm and dry. He is not diaphoretic.  Psychiatric: He has a normal mood and affect. His behavior is normal.  Nursing note and vitals reviewed.    UC Treatments / Results  Labs (all labs ordered are listed, but only abnormal results are displayed) Labs Reviewed - No data to display  EKG  EKG Interpretation None       Radiology No results found.  Procedures Procedures (including critical care time)  Medications Ordered in UC Medications - No data to display   Initial Impression / Assessment and Plan / UC Course  I have reviewed the triage vital signs and the nursing notes.  Pertinent labs & imaging results that were available during my care of the patient were reviewed by me and considered in my medical decision making (see chart for details).     Hx and exam most c/w viral URI.  Reviewed PMH. Pt seems to do best with a 2 week taper of prednisone along with albuterol inhaler.    Final Clinical Impressions(s) / UC Diagnoses   Final diagnoses:  Upper respiratory tract infection, unspecified type  Chest tightness   Prescription to hold with expiration date for azithromycin. Pt to fill if persistent fever develops or not improving in 1 week.    New Prescriptions Discharge Medication List as of 12/29/2016 10:53 AM    START taking these medications   Details  azithromycin (ZITHROMAX) 250 MG tablet Take 1 tablet (250 mg total) by mouth daily. Take first 2 tablets together, then 1 every day until  finished., Starting Sat 12/29/2016, Normal    !! predniSONE (DELTASONE) 20 MG tablet 3 tabs po daily x 3 days, then 2 tabs x 3 days, then 1.5 tabs x 3 days, then 1 tab x 3 days, then 0.5 tabs x 3 days, Normal     !! - Potential duplicate medications found. Please discuss with provider.      Controlled Substance Prescriptions [place] Controlled Substance Registry consulted? Not Applicable   Tyrell Antonio 12/29/16 1108

## 2016-12-29 NOTE — ED Triage Notes (Signed)
[name] complains of a non-productive cough for 3 days and has had chest heaviness for 2 days. Denies wheezing, shortness of breath, fever, chills or sweats.

## 2017-01-22 ENCOUNTER — Telehealth: Payer: Self-pay | Admitting: Internal Medicine

## 2017-01-22 NOTE — Telephone Encounter (Signed)
Spoke with patient. He has not been seen since 2016. Stated that his congestion has been under control until about 3 weeks ago when he started having heaviest in his chest. He has a productive cough with thick, yellowish phlegm. Denies any SOB, fever, or chest pains.   He went to a walk-in clinic and was prescribed a prednisone taper and erythromycin. They did not work. Because of this, he wishes to be seen by MW instead of having medication called in for him.   MW, please advise if it is ok for [place] to work this patient into your schedule. Thanks!

## 2017-01-22 NOTE — Telephone Encounter (Signed)
Ok to add at end of day Wed or Thursday but not today

## 2017-01-22 NOTE — Telephone Encounter (Signed)
Patient is scheduled with MW on 02/01/2017 at 4:45.

## 2017-01-22 NOTE — Telephone Encounter (Signed)
Called pt, states he will need to go home and check calendar before scheduling appt on Wednesday or Thursday.  When pt calls back pt can be scheduled at end of day per MW below.  Thanks.

## 2017-01-23 ENCOUNTER — Encounter: Payer: Self-pay | Admitting: Internal Medicine

## 2017-01-23 ENCOUNTER — Ambulatory Visit (INDEPENDENT_AMBULATORY_CARE_PROVIDER_SITE_OTHER): Payer: Medicare Other | Admitting: Internal Medicine

## 2017-01-23 VITALS — BP 106/60 | HR 80 | Ht 66.5 in | Wt 183.0 lb

## 2017-01-23 DIAGNOSIS — J45991 Cough variant asthma: Secondary | ICD-10-CM | POA: Diagnosis not present

## 2017-01-23 DIAGNOSIS — R05 Cough: Secondary | ICD-10-CM | POA: Diagnosis not present

## 2017-01-23 DIAGNOSIS — R058 Other specified cough: Secondary | ICD-10-CM

## 2017-01-23 MED ORDER — AMOXICILLIN-POT CLAVULANATE 875-125 MG PO TABS: 1.0000 | ORAL_TABLET | Freq: Two times a day (BID) | ORAL | 0 refills | Status: AC

## 2017-01-23 MED ORDER — PREDNISONE 10 MG PO TABS: ORAL_TABLET | ORAL | 0 refills | Status: DC

## 2017-01-23 MED ORDER — MOMETASONE FURO-FORMOTEROL FUM 100-5 MCG/ACT IN AERO: 2.0000 | INHALATION_SPRAY | Freq: Two times a day (BID) | RESPIRATORY_TRACT | 0 refills | Status: DC

## 2017-01-23 NOTE — Progress Notes (Signed)
Subjective:    Patient ID: [name], male    DOB: 1940/11/16    MRN: [id number]    Brief patient profile:  64 yowm never smoker with no h/o any respiratory complaints referred by Dr Thressa Sheller to pulmonary clinic 03/10/2015 for cough since early Sept 2016    History of Present Illness  03/10/2015 1st [facility] office visit/ [name]   Chief Complaint  Patient presents with  . PULMONARY CONSULT    pt. ref. by dr. Thressa Sheller. pt. states he has prod. cough yellow in color. occ. wheezing. no chest pain/tightness.  Started as head cold then into chest rx erythromycin then pred then amox /pred / inhaler / albuterol Some better with tessilon and cough drops - also transiently better with pred then worse off it  Cough worst at hs and with voice use  rec Pantoprazole (protonix) 40 mg   Take  30-60 min before first meal of the day and Pepcid (famotidine)  20 mg one @  bedtime until return to office - this is the best way to tell whether stomach acid is contributing to your problem.  GERD diet  Prednisone 10 mg take  4 each am x 2 days,   2 each am x 2 days,  1 each am x 2 days and stop   For cough > tessilon 200 mg every 4 hours   sinus CT  Neg >   rec For drainage / throat tickle try take chlorpheniramine    03/28/2015  f/u ov/[name] re: asthma  Chief Complaint  Patient presents with  . Follow-up    Pt denies cough. SOB, wheeze, CP/tightness. Pt does feel that there is PND/drainage with some throat clearing with white mucus. Pt is unsure if medications are helping.   much improved p prednisone and worse off it/ did not start h1 as rec  rec Prednisone 10 mg take  4 each am x 2 days,   2 each am x 2 days,  1 each am x 2 days and stop  Start dulera 100 Take 2 puffs first thing in am and then another 2 puffs about 12 hours later.  Only use your albuterol as a rescue medication   04/11/2015  f/u ov/[name] re: uacs vs asthma  Chief Complaint  Patient presents with  .  Follow-up    Pt states his breathing is doing well and has not had any cough. No new co's today.   8/10   2= sense of throat congestion daytime never noct Not limited by breathing from desired activities   Confused with names of meds, never tried h1 as rec/ using dulera at 1030 am, not first thing rec Change dulera 100 one first thing in am and second dose 12 hours later For drainage / throat tickle try take CHLORPHENIRAMINE  4 mg - take one every 4 hours as needed   GERD diet      04/27/2015 acute extended ov/[name] re: worsening throat and chest "congestion" off gerd rx  Chief Complaint  Patient presents with  . Acute Visit    Pt c/o fever on and off for the past 4 days. He states "upper chest" is still congested but not coughing.   confused with instruction from last ov/ stopped the gerd rx "I don't have HB"> much worse since then but still Not limited by breathing from desired activities   Could not tol h1 > drowsy. Already saw primary care for fever, no source identified  rec  Stay on dulera 100 one twice daily  Resume Pantoprazole (protonix) 40 mg   Take  30-60 min before first meal of the day and Pepcid (famotidine)  20 mg one @  bedtime until return to office - this is the best way to tell whether stomach acid is contributing to your problem.   Augmentin 875 mg take one pill twice daily  X 10 days Prednisone 10 mg take  4 each am x 2 days,   2 each am x 2 days,  1 each am x 2 days and stop  For drainage zyrtec 10 mg one at bedtime (over the counter)   Keep your original appt for the 19th of dec and bring all active medications    05/09/2015  f/u ov/[name] re: uacs vs cough variant asthma  still on dulera 100 Chief Complaint  Patient presents with  . Follow-up    Pt states he is overall feeling better, but c/o hoarseness still. No new co's today.   Not limited by breathing from desired activities   rec Stop dulera and see what difference this makes Continue to suppress acid  until the cough and throat symptoms are 100% gone x 2 weeks without the need for cough medication Suppress the urge to clear your throat with the candy and benzonate  Consider a week away from the house to see what difference it makes    01/23/2017  Acute extended ov/[name] re:  Cough flare  Chief Complaint  Patient presents with  . Acute Visit    3 1/2 wks ago started coughing and producing yellow sputum- seen at [facility] in [place] and txed with arithromycin and pred taper- only helped some.   not on any maintenance (stopped it all x months and fine off) Acutely ill Dec 29 2016 with sick wife contact "same cold" min better p pred rx now worse cough but min sob unless coughing and no inhaler - cough worse day than noct   No obvious patterns in day to day or daytime variability or assoc mucus plugs or hemoptysis or cp or chest tightness, subjective wheeze or overt sinus or hb symptoms. No unusual exp hx or h/o childhood pna/ asthma or knowledge of premature birth.   . Also denies any obvious fluctuation of symptoms with weather or environmental changes or other aggravating or alleviating factors except as outlined above   Current Medications, Allergies, Complete Past Medical History, Past Surgical History, Family History, and Social History were reviewed in [employer] record.  ROS  The following are not active complaints unless bolded sore throat, dysphagia, dental problems, itching, sneezing,  nasal congestion or excess/ purulent secretions, ear ache,   fever, chills, sweats, unintended wt loss, classically pleuritic or exertional cp,  orthopnea pnd or leg swelling, presyncope, palpitations, abdominal pain, anorexia, nausea, vomiting, diarrhea  or change in bowel or bladder habits, change in stools or urine, dysuria,hematuria,  rash, arthralgias, visual complaints, headache, numbness, weakness or ataxia or problems with walking or coordination,  change in mood/affect or  memory.                   Objective:   Physical Exam  amb wm nad  S   03/28/2015   183 > 04/11/2015  183 > 04/27/2015    183 > 05/09/2015    180 > 01/23/2017  183      03/10/15 179 lb (81.194 kg)  03/27/14 179 lb (81.194 kg)  11/26/13 177 lb (80.287 kg)  Vital signs reviewed  - Note on arrival 02 sats  95% on RA     HEENT: nl dentition, turbinates, and oropharynx  . Nl external ear canals without cough reflex   NECK :  without JVD/Nodes/TM/ nl carotid upstrokes bilaterally   LUNGS: no acc muscle use, junky bilateral exp rhonchi/ cough on inspiration / rhonchi better with plm    CV:  RRR  no s3 or murmur or increase in P2, no edema   ABD:  soft and nontender with nl excursion in the supine position. No bruits or organomegaly, bowel sounds nl  MS:  warm without deformities, calf tenderness, cyanosis or clubbing  SKIN: warm and dry without lesions    NEURO:  alert, approp, no deficits              Assessment & Plan:

## 2017-01-23 NOTE — Patient Instructions (Addendum)
Any time you flare for any reason>  Try prilosec otc 20mg   Take 30-60 min before first meal of the day and Pepcid ac (famotidine) 20 mg one @  bedtime until cough is completely gone for at least a week without the need for cough suppression     GERD (REFLUX)  is an extremely common cause of respiratory symptoms just like yours , many times with no obvious heartburn at all.    It can be treated with medication, but also with lifestyle changes including elevation of the head of your bed (ideally with 6 inch  bed blocks),  Smoking cessation, avoidance of late meals, excessive alcohol, and avoid fatty foods, chocolate, peppermint, colas, red wine, and acidic juices such as orange juice.  NO MINT OR MENTHOL PRODUCTS SO NO COUGH DROPS   USE SUGARLESS CANDY INSTEAD (Jolley ranchers or Stover's or Life Savers) or even ice chips will also do - the key is to swallow to prevent all throat clearing. NO OIL BASED VITAMINS - use powdered substitutes.   Dulera 100 Take 2 puffs first thing in am and then another 2 puffs about 12 hours later until you use it up    Work on inhaler technique:  relax and gently blow all the way out then take a nice smooth deep breath back in, triggering the inhaler at same time you start breathing in.  Hold for up to 5 seconds if you can. Blow out thru nose. Rinse and gargle with water when done  Augmentin 875 mg take one pill twice daily  X 10 days - take at breakfast and supper with large glass of water.  It would help reduce the usual side effects (diarrhea and yeast infections) if you ate cultured yogurt at lunch.      If not improving > Prednisone 10 mg take  4 each am x 2 days,   2 each am x 2 days,  1 each am x 2 days and stop

## 2017-01-24 NOTE — Assessment & Plan Note (Signed)
03/28/2015    try dulera 100 2bid  - 04/11/2015    try dulera 100 one bid  - 05/09/2015    try off dulera  - 01/23/2017  After extensive coaching HFA effectiveness =    75% > restart dulea 100 2bid short term   Flare in setting of uri so ok to restart dulera short term and then after finishes the sample see what if any symptoms recur  In meantime,  Of the three most common causes of  Sub-acute or recurrent or chronic cough, only one (GERD)  can actually contribute to/ trigger  the other two (asthma and post nasal drip syndrome)  and perpetuate the cylce of cough.  While not intuitively obvious, many patients with chronic low grade reflux do not cough until there is a primary insult that disturbs the protective epithelial barrier and exposes sensitive nerve endings.   This is typically viral but can be direct physical injury such as with an endotracheal tube.   The point is that once this occurs, it is difficult to eliminate the cycle  using anything but a maximally effective acid suppression regimen at least in the short run, accompanied by an appropriate diet to address non acid GERD and control / eliminate the cough itself for at least 3 days.   Also to cover possibility of assoc sinusitis > augmentin x 10 days and pred x 6 if not improving   I had an extended discussion with the patient reviewing all relevant studies completed to date and  lasting 15 to 20 minutes of a 25 minute acute  visit    Each maintenance medication was reviewed in detail including most importantly the difference between maintenance and prns and under what circumstances the prns are to be triggered using an action plan format that is not reflected in the computer generated alphabetically organized AVS.    Please see AVS for specific instructions unique to this visit that I personally wrote and verbalized to the the pt in detail and then reviewed with pt  by my nurse highlighting any  changes in therapy recommended at today's  visit to their plan of care.

## 2017-01-24 NOTE — Assessment & Plan Note (Signed)
-   sinus CT 03/15/15 No significant paranasal sinus disease. Nasal septal deviation LEFT-to-RIGHT of 3 mm. - Allergy profile 04/11/2015 >  IgE 23 and pos RAST trees/grass   This episode clearly viral though could have mild assoc sinusitis > augmentin should cover

## 2017-01-29 ENCOUNTER — Ambulatory Visit: Payer: Medicare Other | Admitting: Internal Medicine

## 2017-02-05 ENCOUNTER — Encounter: Payer: Self-pay | Admitting: Family Medicine

## 2017-02-25 DIAGNOSIS — M25561 Pain in right knee: Secondary | ICD-10-CM | POA: Diagnosis not present

## 2017-02-25 DIAGNOSIS — Z471 Aftercare following joint replacement surgery: Secondary | ICD-10-CM | POA: Diagnosis not present

## 2017-02-25 DIAGNOSIS — M25552 Pain in left hip: Secondary | ICD-10-CM | POA: Diagnosis not present

## 2017-02-25 DIAGNOSIS — Z96641 Presence of right artificial hip joint: Secondary | ICD-10-CM | POA: Diagnosis not present

## 2017-03-08 DIAGNOSIS — Z23 Encounter for immunization: Secondary | ICD-10-CM | POA: Diagnosis not present

## 2017-03-12 ENCOUNTER — Encounter: Payer: Self-pay | Admitting: Family Medicine

## 2017-03-13 MED ORDER — SILDENAFIL CITRATE 20 MG PO TABS: 40.0000 mg | ORAL_TABLET | Freq: Every day | ORAL | 5 refills | Status: DC | PRN

## 2017-03-24 ENCOUNTER — Encounter: Payer: Self-pay | Admitting: Family Medicine

## 2017-03-25 MED ORDER — SIMVASTATIN 80 MG PO TABS: 40.0000 mg | ORAL_TABLET | Freq: Every day | ORAL | 2 refills | Status: DC

## 2017-03-28 DIAGNOSIS — M1612 Unilateral primary osteoarthritis, left hip: Secondary | ICD-10-CM | POA: Diagnosis not present

## 2017-04-17 ENCOUNTER — Ambulatory Visit (INDEPENDENT_AMBULATORY_CARE_PROVIDER_SITE_OTHER): Payer: Medicare Other | Admitting: Family Medicine

## 2017-04-17 ENCOUNTER — Encounter: Payer: Self-pay | Admitting: Family Medicine

## 2017-04-17 VITALS — BP 134/81 | HR 91 | Ht 67.0 in | Wt 177.0 lb

## 2017-04-17 DIAGNOSIS — N529 Male erectile dysfunction, unspecified: Secondary | ICD-10-CM | POA: Insufficient documentation

## 2017-04-17 DIAGNOSIS — R252 Cramp and spasm: Secondary | ICD-10-CM

## 2017-04-17 DIAGNOSIS — I10 Essential (primary) hypertension: Secondary | ICD-10-CM

## 2017-04-17 NOTE — Progress Notes (Signed)
Subjective:    Patient ID: [name], male    DOB: March 02, 1941, 76 y.o.   MRN: [id number]  HPI For the past 3 weeks his daughter has been moving and he has been helping them out. He has been stress with this.  For about a month he has been getting cramps in the left hand.  Usually the index finger and thumb. Lasts a couple of minutes.    Has also been having some sleep issues. Goes ot bed around 11 PM and then wakes up around 1AM.  Then wakes up again around 3AM.  Sleep has been worse more recently  He is also worried about his wife. She stays up very late to between 1-3 in the morning and then sleep half of the day. She has problems with SOB.  There is some tension between her son over her cats.    Hypertension- Pt denies chest pain, SOB, dizziness, or heart palpitations.  Taking meds as directed w/o problems.  Denies medication side effects.       Review of Systems   BP 134/81   Pulse 91   Ht 5\' 7"  (1.702 m)   Wt 177 lb (80.3 kg)   BMI 27.72 kg/m     Allergies  Allergen Reactions  . Morphine And Related Nausea And Vomiting    Past Medical History:  Diagnosis Date  . Arthritis    osteoarthritis right hip  . Carotid artery plaque    mild  . Colon polyps   . Erectile dysfunction   . GERD (gastroesophageal reflux disease)    history- no current problems  . History of nephrolithiasis    episodes x 6   . HLD (hyperlipidemia)   . HTN (hypertension)   . Impaired hearing    bilateral hearing aids  . Sleep apnea    cpap used with nasal clip    Past Surgical History:  Procedure Laterality Date  . cataract Bilateral 2015  . CYSTOSCOPY W/ STONE MANIPULATION     x 2  . HEMORRHOID SURGERY    . INGUINAL HERNIA REPAIR Right   . LAPAROSCOPIC DRAINAGE RENAL CYST    . LITHOTRIPSY     x 1   . TOTAL HIP ARTHROPLASTY Right 11/15/2015   Procedure: RIGHT TOTAL HIP ARTHROPLASTY ANTERIOR APPROACH;  Surgeon: Paralee Cancel, MD;  Location: [facility];  Service: Orthopedics;   Laterality: Right;    Social History   Socioeconomic History  . Marital status: Married    Spouse name: [name]   . Number of children: Not on file  . Years of education: Not on file  . Highest education level: Not on file  Social Needs  . Financial resource strain: Not on file  . Food insecurity - worry: Not on file  . Food insecurity - inability: Not on file  . Transportation needs - medical: Not on file  . Transportation needs - non-medical: Not on file  Occupational History  . Occupation: Retired   Tobacco Use  . Smoking status: Never Smoker  . Smokeless tobacco: Never Used  Substance and Sexual Activity  . Alcohol use: No  . Drug use: No  . Sexual activity: Yes  Other Topics Concern  . Not on file  Social History Narrative   Married.  No regular exercise.  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 Colon cancer Father   . Heart attack Mother 73  . Diabetes Unknown        1st degree relative    Outpatient Encounter Medications as of 04/17/2017  Medication Sig  . ascorbic acid (VITAMIN C) 1000 MG tablet Take 1,000 mg by mouth daily.  Marland Kitchen aspirin EC 81 MG tablet Take 81 mg by mouth daily.  . Cholecalciferol (VITAMIN D3) 2000 units TABS Take 1 tablet by mouth daily.  Marland Kitchen glucosamine-chondroitin 500-400 MG tablet Take 1 tablet by mouth daily.  . Meloxicam (MOBIC PO) Take by mouth as needed.  . mometasone-formoterol (DULERA) 100-5 MCG/ACT AERO Inhale 2 puffs into the lungs 2 (two) times daily.  . sildenafil (REVATIO) 20 MG tablet Take 2-5 tablets (40-100 mg total) by mouth daily as needed.  . simvastatin (ZOCOR) 80 MG tablet Take 0.5 tablets (40 mg total) at bedtime by mouth.  . valsartan-hydrochlorothiazide (DIOVAN-HCT) 80-12.5 MG per tablet  TAKE 1 TABLET EVERY DAY  . [DISCONTINUED] predniSONE (DELTASONE) 10 MG tablet Take  4 each am x 2 days,   2 each am x 2 days,  1 each am x 2 days and stop   No facility-administered encounter medications on file as of 04/17/2017.           Objective:   Physical Exam  Constitutional: He is oriented to person, place, and time. He appears well-developed and well-nourished.  HENT:  Head: Normocephalic and atraumatic.  Neck: Neck supple. No thyromegaly present.  Cardiovascular: Normal rate, regular rhythm and normal heart sounds.  Pulmonary/Chest: Effort normal and breath sounds normal.  Lymphadenopathy:    He has no cervical adenopathy.  Neurological: He is alert and oriented to person, place, and time.  Skin: Skin is warm and dry.  Psychiatric: He has a normal mood and affect. His behavior is normal.        Assessment & Plan:  Hand cramping-unclear etiology. Will check for electrolytes disturbance, thyroid disorder and anemia. If all normal and may just be from overuse since he has been doing a lot of helping his daughter move and lifting boxes etc.  HTN -Well controlled. Continue current regimen. Follow up in  6 months.  Due for labs. He is not fasting so can check Lipids at next OV.    Insomnia - suspect due to increase stress at home.

## 2017-04-18 LAB — CBC
HCT: 46.8 % (ref 38.5–50.0)
Hemoglobin: 16.9 g/dL (ref 13.2–17.1)
MCH: 32.3 pg (ref 27.0–33.0)
MCHC: 36.1 g/dL — AB (ref 32.0–36.0)
MCV: 89.3 fL (ref 80.0–100.0)
MPV: 9.9 fL (ref 7.5–12.5)
Platelets: 207 10*3/uL (ref 140–400)
RBC: 5.24 10*6/uL (ref 4.20–5.80)
RDW: 13.2 % (ref 11.0–15.0)
WBC: 8.8 10*3/uL (ref 3.8–10.8)

## 2017-04-18 LAB — COMPLETE METABOLIC PANEL WITH GFR
AG Ratio: 1.9 (calc) (ref 1.0–2.5)
ALBUMIN MSPROF: 4.6 g/dL (ref 3.6–5.1)
ALKALINE PHOSPHATASE (APISO): 55 U/L (ref 40–115)
ALT: 35 U/L (ref 9–46)
AST: 29 U/L (ref 10–35)
BILIRUBIN TOTAL: 1 mg/dL (ref 0.2–1.2)
BUN: 24 mg/dL (ref 7–25)
CHLORIDE: 99 mmol/L (ref 98–110)
CO2: 30 mmol/L (ref 20–32)
CREATININE: 1.07 mg/dL (ref 0.70–1.18)
Calcium: 10 mg/dL (ref 8.6–10.3)
GFR, Est African American: 78 mL/min/{1.73_m2} (ref >=60)
GFR, Est Non African American: 68 mL/min/{1.73_m2} (ref >=60)
GLUCOSE: 106 mg/dL — AB (ref 65–99)
Globulin: 2.4 g/dL (calc) (ref 1.9–3.7)
Potassium: 3.6 mmol/L (ref 3.5–5.3)
Sodium: 141 mmol/L (ref 135–146)
Total Protein: 7 g/dL (ref 6.1–8.1)

## 2017-04-18 LAB — TESTOSTERONE: TESTOSTERONE: 221 ng/dL — AB (ref 250–827)

## 2017-04-18 LAB — TSH: TSH: 0.92 m[IU]/L (ref 0.40–4.50)

## 2017-05-16 ENCOUNTER — Ambulatory Visit: Payer: Medicare Other | Admitting: Family Medicine

## 2017-05-17 ENCOUNTER — Ambulatory Visit: Payer: Medicare Other | Admitting: Family Medicine

## 2017-05-20 ENCOUNTER — Ambulatory Visit: Payer: Medicare Other | Admitting: Family Medicine

## 2017-06-09 IMAGING — DX DG HIP (WITH OR WITHOUT PELVIS) 1V PORT*R*
2 series · 2 of 2 positions shown · non-contrast
Comparison: None.

CLINICAL DATA: Status post right total hip replacement.

EXAM:
DG HIP (WITH OR WITHOUT PELVIS) 1V PORT RIGHT

[pelvis ap]
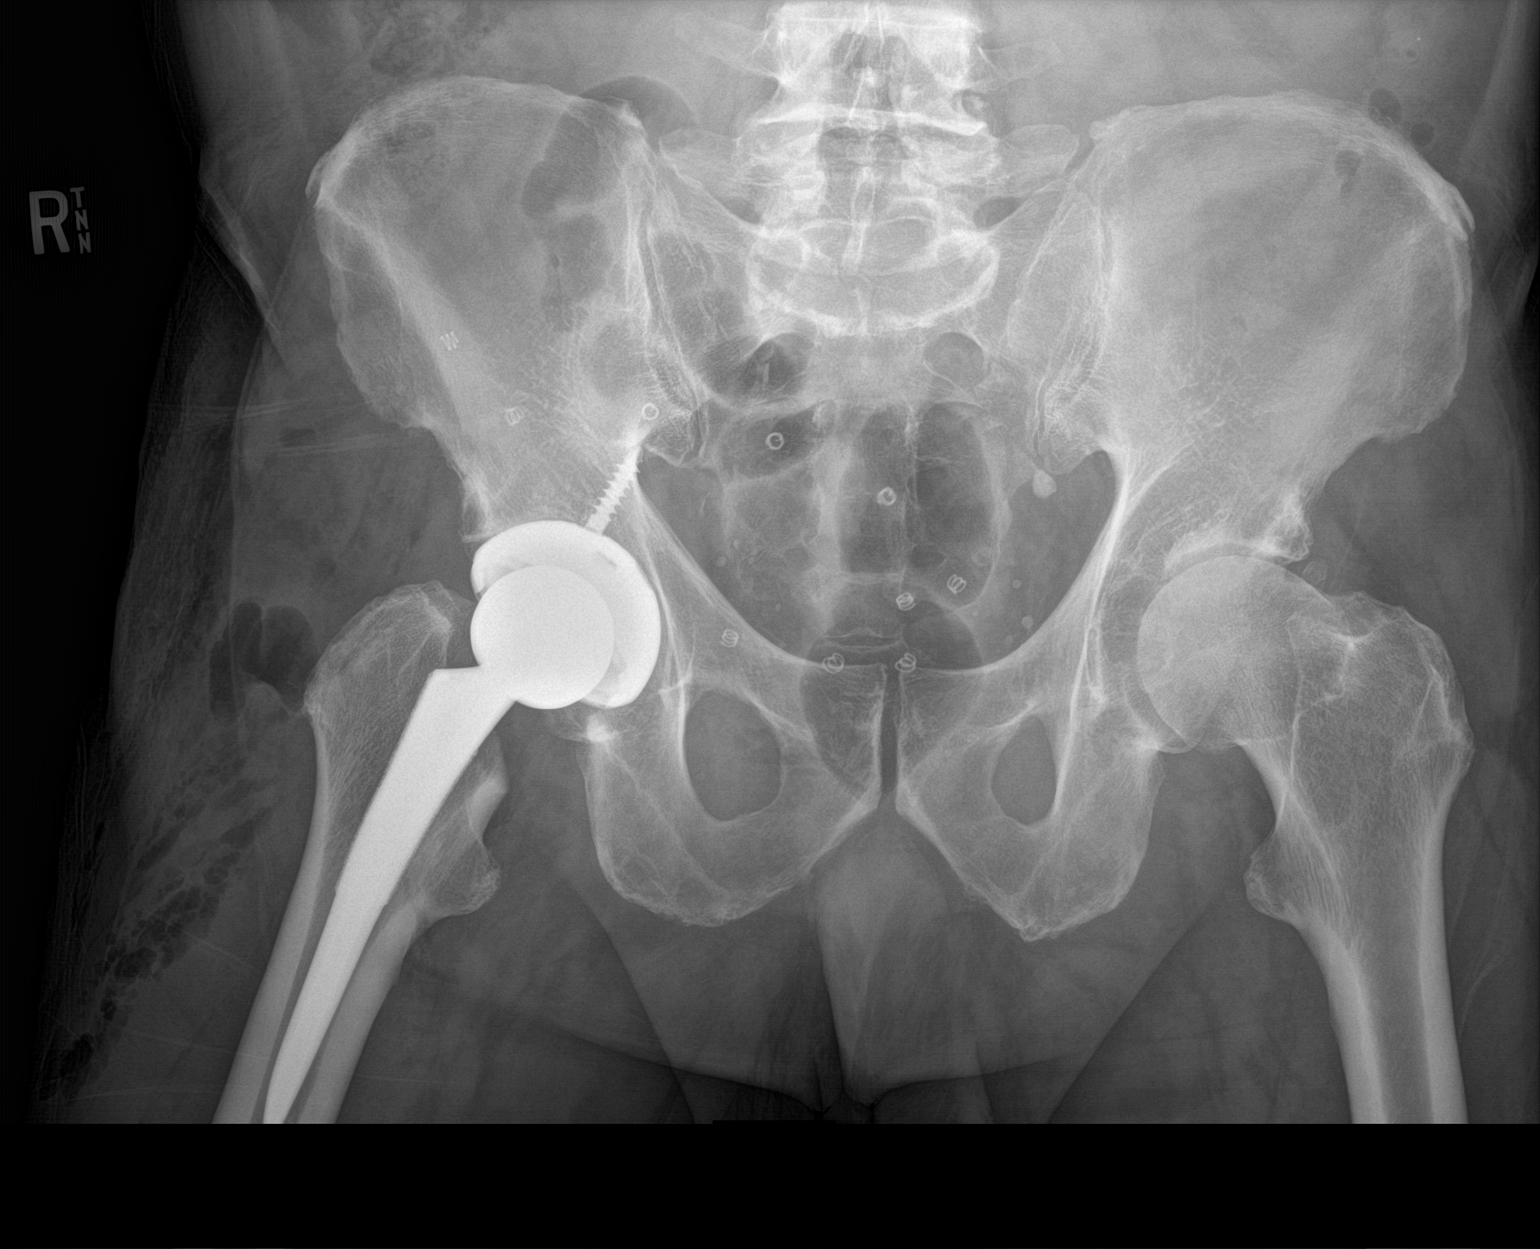

[hip lat]
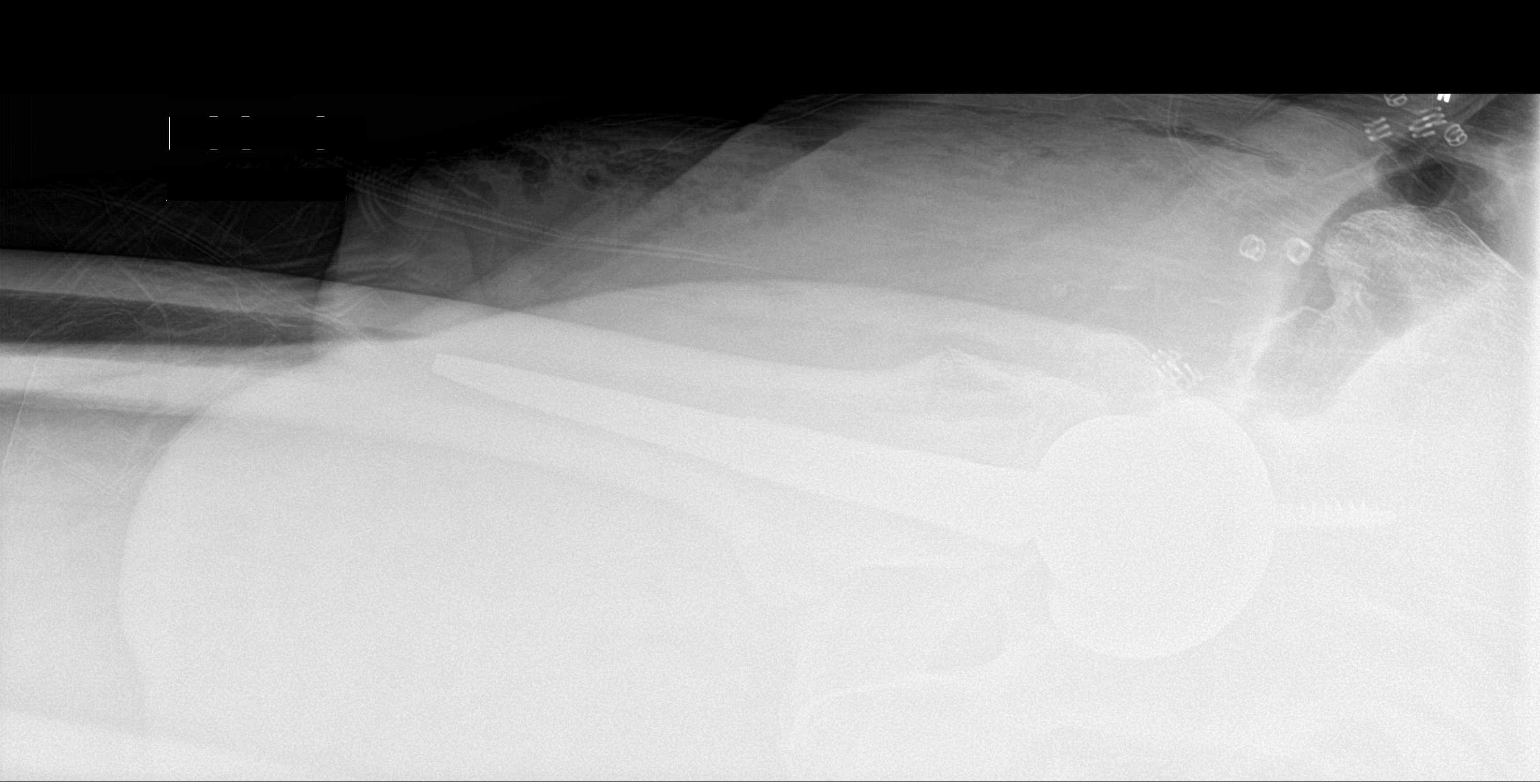

[2 of 2 positions shown; findings below may reference images not displayed]

FINDINGS: The femoral and acetabular components appear to be well situated.
Expected postoperative changes are noted in the surrounding soft
tissues. No fracture or dislocation is noted.
IMPRESSION: Status post right total hip arthroplasty.  Open O

## 2017-06-10 DIAGNOSIS — M1710 Unilateral primary osteoarthritis, unspecified knee: Secondary | ICD-10-CM | POA: Insufficient documentation

## 2017-06-10 DIAGNOSIS — M25562 Pain in left knee: Secondary | ICD-10-CM | POA: Diagnosis not present

## 2017-06-10 DIAGNOSIS — M179 Osteoarthritis of knee, unspecified: Secondary | ICD-10-CM | POA: Insufficient documentation

## 2017-06-10 DIAGNOSIS — M1712 Unilateral primary osteoarthritis, left knee: Secondary | ICD-10-CM | POA: Diagnosis not present

## 2017-06-25 DIAGNOSIS — F4323 Adjustment disorder with mixed anxiety and depressed mood: Secondary | ICD-10-CM | POA: Diagnosis not present

## 2017-07-13 ENCOUNTER — Encounter: Payer: Self-pay | Admitting: Family Medicine

## 2017-07-16 DIAGNOSIS — F4323 Adjustment disorder with mixed anxiety and depressed mood: Secondary | ICD-10-CM | POA: Diagnosis not present

## 2017-07-16 MED ORDER — VALSARTAN-HYDROCHLOROTHIAZIDE 80-12.5 MG PO TABS: 1.0000 | ORAL_TABLET | Freq: Every day | ORAL | 1 refills | Status: DC

## 2017-07-25 DIAGNOSIS — H52203 Unspecified astigmatism, bilateral: Secondary | ICD-10-CM | POA: Diagnosis not present

## 2017-07-25 DIAGNOSIS — H18603 Keratoconus, unspecified, bilateral: Secondary | ICD-10-CM | POA: Diagnosis not present

## 2017-07-25 DIAGNOSIS — Z961 Presence of intraocular lens: Secondary | ICD-10-CM | POA: Diagnosis not present

## 2017-07-25 DIAGNOSIS — H43813 Vitreous degeneration, bilateral: Secondary | ICD-10-CM | POA: Diagnosis not present

## 2017-08-01 DIAGNOSIS — F4323 Adjustment disorder with mixed anxiety and depressed mood: Secondary | ICD-10-CM | POA: Diagnosis not present

## 2017-08-12 DIAGNOSIS — F4323 Adjustment disorder with mixed anxiety and depressed mood: Secondary | ICD-10-CM | POA: Diagnosis not present

## 2017-08-21 DIAGNOSIS — M25552 Pain in left hip: Secondary | ICD-10-CM | POA: Diagnosis not present

## 2017-08-21 DIAGNOSIS — M1612 Unilateral primary osteoarthritis, left hip: Secondary | ICD-10-CM | POA: Insufficient documentation

## 2017-09-03 DIAGNOSIS — F4323 Adjustment disorder with mixed anxiety and depressed mood: Secondary | ICD-10-CM | POA: Diagnosis not present

## 2017-09-24 DIAGNOSIS — F4323 Adjustment disorder with mixed anxiety and depressed mood: Secondary | ICD-10-CM | POA: Diagnosis not present

## 2017-09-30 ENCOUNTER — Encounter (HOSPITAL_COMMUNITY): Payer: Self-pay | Admitting: *Deleted

## 2017-10-07 ENCOUNTER — Encounter: Payer: Self-pay | Admitting: Family Medicine

## 2017-10-07 ENCOUNTER — Ambulatory Visit (INDEPENDENT_AMBULATORY_CARE_PROVIDER_SITE_OTHER): Payer: Medicare Other | Admitting: Family Medicine

## 2017-10-07 VITALS — BP 120/68 | HR 82 | Ht 67.0 in | Wt 183.0 lb

## 2017-10-07 DIAGNOSIS — I10 Essential (primary) hypertension: Secondary | ICD-10-CM | POA: Diagnosis not present

## 2017-10-07 DIAGNOSIS — Z01818 Encounter for other preprocedural examination: Secondary | ICD-10-CM | POA: Diagnosis not present

## 2017-10-07 DIAGNOSIS — R7309 Other abnormal glucose: Secondary | ICD-10-CM

## 2017-10-07 LAB — POCT GLYCOSYLATED HEMOGLOBIN (HGB A1C): Hemoglobin A1C: 5.2

## 2017-10-07 NOTE — Progress Notes (Signed)
Subjective:    Patient ID: [name], male    DOB: 1941/01/13, 77 y.o.   MRN: [id number]  HPI 77 year old male is here today for preoperative clearance for hip surgery with Dr. Channing Mutters.  He does have a history of hypertension, hyperlipidemia, kidney stones, and upper airway cough syndrome.  He is scheduled for total left hip arthroplasty in June.  Has any recent chest pain, shortness of breath or difficulty swallowing.  He did want to update me and let me know that he has passed 2 kidney stones since he was last here.  He was taking meloxicam around that time so he thinks it may have helped dull his pain.  Review of Systems   Comprehensive review of systems is negative except for HPI.  BP 120/68   Pulse 82   Ht 5\' 7"  (1.702 m)   Wt 183 lb (83 kg)   SpO2 98%   BMI 28.66 kg/m     Allergies  Allergen Reactions  . Morphine And Related Nausea And Vomiting    Past Medical History:  Diagnosis Date  . Arthritis    osteoarthritis right hip  . Carotid artery plaque    mild  . Colon polyps   . Erectile dysfunction   . GERD (gastroesophageal reflux disease)    history- no current problems  . History of nephrolithiasis    episodes x 6   . HLD (hyperlipidemia)   . HTN (hypertension)   . Impaired hearing    bilateral hearing aids  . Sleep apnea    cpap used with nasal clip    Past Surgical History:  Procedure Laterality Date  . cataract Bilateral 2015  . CYSTOSCOPY W/ STONE MANIPULATION     x 2  . HEMORRHOID SURGERY    . INGUINAL HERNIA REPAIR Right   . LAPAROSCOPIC DRAINAGE RENAL CYST    . LITHOTRIPSY     x 1   . TOTAL HIP ARTHROPLASTY Right 11/15/2015   Procedure: RIGHT TOTAL HIP ARTHROPLASTY ANTERIOR APPROACH;  Surgeon: Paralee Cancel, MD;  Location: [facility];  Service: Orthopedics;  Laterality: Right;    Social History   Socioeconomic History  . Marital status: Married    Spouse name: [name]   . Number of children: Not on file  . Years of education: Not on  file  . Highest education level: Not on file  Occupational History  . Occupation: Retired   Scientific laboratory technician  . Financial resource strain: Not on file  . Food insecurity:    Worry: Not on file    Inability: Not on file  . Transportation needs:    Medical: Not on file    Non-medical: Not on file  Tobacco Use  . Smoking status: Never Smoker  . Smokeless tobacco: Never Used  Substance and Sexual Activity  . Alcohol use: No  . Drug use: No  . Sexual activity: Yes  Lifestyle  . Physical activity:    Days per week: Not on file    Minutes per session: Not on file  . Stress: Not on file  Relationships  . Social connections:    Talks on phone: Not on file    Gets together: Not on file    Attends religious service: Not on file    Active member of club or organization: Not on file    Attends meetings of clubs or organizations: Not on file    Relationship status: Not on file  . Intimate partner violence:    Fear  of current or ex partner: Not on file    Emotionally abused: Not on file    Physically abused: Not on file    Forced sexual activity: Not on file  Other Topics Concern  . Not on file  Social History Narrative   Married.  No regular exercise.  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 Colon cancer Father   . Heart attack Mother 69  . Diabetes Unknown        1st degree relative    Outpatient Encounter Medications as of 10/07/2017  Medication Sig  . ascorbic acid (VITAMIN C) 1000 MG tablet Take 1,000 mg by mouth daily.  Marland Kitchen aspirin EC 81 MG tablet Take 81 mg by mouth daily.  . Cholecalciferol (VITAMIN D3) 2000 units TABS Take 1 tablet by mouth daily.  Marland Kitchen glucosamine-chondroitin 500-400 MG tablet Take 1 tablet by mouth daily.  . Meloxicam (MOBIC  PO) Take by mouth as needed.  . sildenafil (REVATIO) 20 MG tablet Take 2-5 tablets (40-100 mg total) by mouth daily as needed.  . simvastatin (ZOCOR) 80 MG tablet Take 0.5 tablets (40 mg total) at bedtime by mouth.  . valsartan-hydrochlorothiazide (DIOVAN-HCT) 80-12.5 MG tablet Take 1 tablet by mouth daily.  . [DISCONTINUED] mometasone-formoterol (DULERA) 100-5 MCG/ACT AERO Inhale 2 puffs into the lungs 2 (two) times daily.   No facility-administered encounter medications on file as of 10/07/2017.          Objective:   Physical Exam  Constitutional: He is oriented to person, place, and time. He appears well-developed and well-nourished.  HENT:  Head: Normocephalic and atraumatic.  Right Ear: External ear normal.  Left Ear: External ear normal.  Nose: Nose normal.  Mouth/Throat: Oropharynx is clear and moist.  TMs and canals are clear.   Eyes: Pupils are equal, round, and reactive to light. Conjunctivae and EOM are normal.  Neck: Neck supple. No thyromegaly present.  Cardiovascular: Normal rate and normal heart sounds.  Pulmonary/Chest: Effort normal and breath sounds normal.  Musculoskeletal: He exhibits no edema.  Lymphadenopathy:    He has no cervical adenopathy.  Neurological: He is alert and oriented to person, place, and time.  Skin: Skin is warm and dry.  Psychiatric: He has a normal mood and affect.        Assessment & Plan:  Preoperative clearance for left total hip arthroplasty. He is overall low risk.  He does have hypertension but it is very well controlled.  No prior history of cardiac disease.  No recent symptoms of chest pain or shortness of breath.  He is limited in exercise because of his current hip pain but overall cardiovascular health is good. He is cleared for surgery. He does have a preop in 2 weeks for EKG and CBC.  Abnormal glucose-he had an abnormal elevated glucose on his labs 6 months ago.  Plan to do hemoglobin A1c to follow that up today.  He is  also due for screening lipids to be repeated when he is able to go.  An A1c 5.2 which is in the normal range.  Kidney stones-if he continues to pass stones may need to get him back in with urology.  I did search for any potential link between the meloxicam and  triggering kidney stones and did not find anything.

## 2017-10-09 NOTE — H&P (Signed)
TOTAL HIP ADMISSION H&P  Patient is admitted for left total hip arthroplasty, anterior approach.  Subjective:  Chief Complaint:   Left hip primary OA / pain  HPI: [name], 77 y.o. male, has a history of pain and functional disability in the left hip(s) due to arthritis and patient has failed non-surgical conservative treatments for greater than 12 weeks to include NSAID's and/or analgesics, use of assistive devices and activity modification.  Onset of symptoms was abrupt starting 6+ months ago with rapidlly worsening course since that time.The patient noted prior procedures of the hip to include arthroplasty on the right hip per Dr. Alvan Dame in 2017.  Patient currently rates pain in the left hip at 9 out of 10 with activity. Patient has worsening of pain with activity and weight bearing, trendelenberg gait, pain that interfers with activities of daily living and pain with passive range of motion. Patient has evidence of periarticular osteophytes and joint space narrowing by imaging studies. This condition presents safety issues increasing the risk of falls.  There is no current active infection.  Risks, benefits and expectations were discussed with the patient.  Risks including but not limited to the risk of anesthesia, blood clots, nerve damage, blood vessel damage, failure of the prosthesis, infection and up to and including death.  Patient understand the risks, benefits and expectations and wishes to proceed with surgery.   PCP: Hali Marry, MD  D/C Plans:       Home  Post-op Meds:       No Rx given  Tranexamic Acid:      To be given - IV   Decadron:      Is to be given  FYI:      ASA  Norco  CPAP  Fentanyl  DME:   Pt already has equipment   PT:   No PT    Patient Active Problem List   Diagnosis Date Noted  . ED (erectile dysfunction) 04/17/2017  . S/P right THA, AA 11/15/2015  . Cough variant asthma 04/03/2015  . Upper airway cough syndrome 03/10/2015  . Groin  strain-right 11/17/2013  . HYPERLIPIDEMIA 02/12/2007  . Essential hypertension 02/12/2007  . COLONIC POLYPS, HX OF 02/12/2007  . NEPHROLITHIASIS, HX OF 02/12/2007   Past Medical History:  Diagnosis Date  . Arthritis    osteoarthritis right hip  . Carotid artery plaque    mild  . Colon polyps   . Erectile dysfunction   . GERD (gastroesophageal reflux disease)    history- no current problems  . History of nephrolithiasis    episodes x 6   . HLD (hyperlipidemia)   . HTN (hypertension)   . Impaired hearing    bilateral hearing aids  . Sleep apnea    cpap used with nasal clip    Past Surgical History:  Procedure Laterality Date  . cataract Bilateral 2015  . CYSTOSCOPY W/ STONE MANIPULATION     x 2  . HEMORRHOID SURGERY    . INGUINAL HERNIA REPAIR Right   . LAPAROSCOPIC DRAINAGE RENAL CYST    . LITHOTRIPSY     x 1   . TOTAL HIP ARTHROPLASTY Right 11/15/2015   Procedure: RIGHT TOTAL HIP ARTHROPLASTY ANTERIOR APPROACH;  Surgeon: Paralee Cancel, MD;  Location: [facility];  Service: Orthopedics;  Laterality: Right;    No current facility-administered medications for this encounter.    Current Outpatient Medications  Medication Sig Dispense Refill Last Dose  . acetaminophen (TYLENOL) 500 MG tablet Take 1,000 mg  by mouth every 6 (six) hours as needed (for pain/headaches.).     Marland Kitchen ascorbic acid (VITAMIN C) 1000 MG tablet Take 1,000 mg by mouth daily with supper.    Taking  . aspirin EC 81 MG tablet Take 81 mg by mouth at bedtime.    Taking  . Cholecalciferol (VITAMIN D3) 2000 units TABS Take 2,000 Units by mouth at bedtime.    Taking  . glucosamine-chondroitin 500-400 MG tablet Take 1 tablet by mouth daily with supper.    Taking  . meloxicam (MOBIC) 15 MG tablet Take 15 mg by mouth daily as needed (for hip pain.).     Marland Kitchen Polyethyl Glycol-Propyl Glycol (LUBRICANT EYE DROPS) 0.4-0.3 % SOLN Place 1 drop into both eyes 3 (three) times daily as needed (for dry/irritated eyes.).     Marland Kitchen sildenafil  (REVATIO) 20 MG tablet Take 2-5 tablets (40-100 mg total) by mouth daily as needed. (Patient taking differently: Take 20-40 mg by mouth daily as needed (for ED). ) 50 tablet 5 Taking  . simvastatin (ZOCOR) 80 MG tablet Take 0.5 tablets (40 mg total) at bedtime by mouth. 45 tablet 2 Taking  . valsartan-hydrochlorothiazide (DIOVAN-HCT) 80-12.5 MG tablet Take 1 tablet by mouth daily. (Patient taking differently: Take 1 tablet by mouth daily with supper. ) 90 tablet 1 Taking   Allergies  Allergen Reactions  . Morphine And Related Nausea And Vomiting    Social History   Tobacco Use  . Smoking status: Never Smoker  . Smokeless tobacco: Never Used  Substance Use Topics  . Alcohol use: No    Family History  Problem Relation Age of Onset  . Colon cancer Father   . Heart attack Mother 13  . Diabetes Unknown        1st degree relative     Review of Systems  Constitutional: Negative.   HENT: Positive for hearing loss.   Eyes: Negative.   Respiratory: Negative.   Cardiovascular: Negative.   Gastrointestinal: Positive for heartburn.  Genitourinary: Negative.   Musculoskeletal: Positive for joint pain.  Skin: Negative.   Neurological: Negative.   Endo/Heme/Allergies: Negative.   Psychiatric/Behavioral: Negative.     Objective:  Physical Exam  Constitutional: He is oriented to person, place, and time. He appears well-developed.  HENT:  Head: Normocephalic.  Eyes: Pupils are equal, round, and reactive to light.  Neck: Neck supple. No JVD present. No tracheal deviation present. No thyromegaly present.  Cardiovascular: Normal rate, regular rhythm and intact distal pulses.  Respiratory: Effort normal and breath sounds normal. No respiratory distress. He has no wheezes.  GI: Soft. There is no tenderness. There is no guarding.  Musculoskeletal:       Left hip: He exhibits decreased range of motion, decreased strength, tenderness and bony tenderness. He exhibits no swelling, no deformity  and no laceration.  Lymphadenopathy:    He has no cervical adenopathy.  Neurological: He is alert and oriented to person, place, and time.  Skin: Skin is warm and dry.  Psychiatric: He has a normal mood and affect.       Labs:  Estimated body mass index is 28.66 kg/m as calculated from the following:   Height as of 10/07/17: 5\' 7"  (1.702 m).   Weight as of 10/07/17: 83 kg (183 lb).   Imaging Review Plain radiographs demonstrate severe degenerative joint disease of the left hip(s). The bone quality appears to be good for age and reported activity level.    Preoperative templating of the joint replacement  has been completed, documented, and submitted to the Operating Room personnel in order to optimize intra-operative equipment management.     Assessment/Plan:  End stage arthritis, left hip  The patient history, physical examination, clinical judgement of the provider and imaging studies are consistent with end stage degenerative joint disease of the left hip and total hip arthroplasty is deemed medically necessary. The treatment options including medical management, injection therapy, arthroscopy and arthroplasty were discussed at length. The risks and benefits of total hip arthroplasty were presented and reviewed. The risks due to aseptic loosening, infection, stiffness, dislocation/subluxation,  thromboembolic complications and other imponderables were discussed.  The patient acknowledged the explanation, agreed to proceed with the plan and consent was signed. Patient is being admitted for inpatient treatment for surgery, pain control, PT, OT, prophylactic antibiotics, VTE prophylaxis, progressive ambulation and ADL's and discharge planning.The patient is planning to be discharged home.     West Pugh Antoniette Peake   PA-C  10/09/2017, 10:59 AM

## 2017-10-15 NOTE — Patient Instructions (Signed)
[name]  10/15/2017   Your procedure is scheduled on: Tuesday 10/22/2017  Report to [facility] Main  Entrance             Report to admitting at  0600   AM    Call this number if you have problems the morning of surgery [phone]     Remember: Do not eat food or drink liquids :After Midnight.     Take these medicines the morning of surgery with A SIP OF WATER: none                               You may not have any metal on your body including hair pins and              piercings  Do not wear jewelry, make-up, lotions, powders or perfumes, deodorant              Men may shave face and neck.   Do not bring valuables to the hospital. [name].  Contacts, dentures or bridgework may not be worn into surgery.  Leave suitcase in the car. After surgery it may be brought to your room.                  Please read over the following fact sheets you were given: _____________________________________________________________________             [facility] - Preparing for Surgery Before surgery, you can play an important role.  Because skin is not sterile, your skin needs to be as free of germs as possible.  You can reduce the number of germs on your skin by washing with CHG (chlorahexidine gluconate) soap before surgery.  CHG is an antiseptic cleaner which kills germs and bonds with the skin to continue killing germs even after washing. Please DO NOT use if you have an allergy to CHG or antibacterial soaps.  If your skin becomes reddened/irritated stop using the CHG and inform your nurse when you arrive at Short Stay. Do not shave (including legs and underarms) for at least 48 hours prior to the first CHG shower.  You may shave your face/neck. Please follow these instructions carefully:  1.  Shower with CHG Soap the night before surgery and the  morning of Surgery.  2.  If you choose to wash your hair,  wash your hair first as usual with your  normal  shampoo.  3.  After you shampoo, rinse your hair and body thoroughly to remove the  shampoo.                           4.  Use CHG as you would any other liquid soap.  You can apply chg directly  to the skin and wash                       Gently with a scrungie or clean washcloth.  5.  Apply the CHG Soap to your body ONLY FROM THE NECK DOWN.   Do not use on face/ open  Wound or open sores. Avoid contact with eyes, ears mouth and genitals (private parts).                       Wash face,  Genitals (private parts) with your normal soap.             6.  Wash thoroughly, paying special attention to the area where your surgery  will be performed.  7.  Thoroughly rinse your body with warm water from the neck down.  8.  DO NOT shower/wash with your normal soap after using and rinsing off  the CHG Soap.                9.  Pat yourself dry with a clean towel.            10.  Wear clean pajamas.            11.  Place clean sheets on your bed the night of your first shower and do not  sleep with pets. Day of Surgery : Do not apply any lotions/deodorants the morning of surgery.  Please wear clean clothes to the hospital/surgery center.  FAILURE TO FOLLOW THESE INSTRUCTIONS MAY RESULT IN THE CANCELLATION OF YOUR SURGERY PATIENT SIGNATURE_________________________________  NURSE SIGNATURE__________________________________  ________________________________________________________________________   [name]  An incentive spirometer is a tool that can help keep your lungs clear and active. This tool measures how well you are filling your lungs with each breath. Taking long deep breaths may help reverse or decrease the chance of developing breathing (pulmonary) problems (especially infection) following:  A long period of time when you are unable to move or be active. BEFORE THE PROCEDURE   If the spirometer includes an  indicator to show your best effort, your nurse or respiratory therapist will set it to a desired goal.  If possible, sit up straight or lean slightly forward. Try not to slouch.  Hold the incentive spirometer in an upright position. INSTRUCTIONS FOR USE  1. Sit on the edge of your bed if possible, or sit up as far as you can in bed or on a chair. 2. Hold the incentive spirometer in an upright position. 3. Breathe out normally. 4. Place the mouthpiece in your mouth and seal your lips tightly around it. 5. Breathe in slowly and as deeply as possible, raising the piston or the ball toward the top of the column. 6. Hold your breath for 3-5 seconds or for as long as possible. Allow the piston or ball to fall to the bottom of the column. 7. Remove the mouthpiece from your mouth and breathe out normally. 8. Rest for a few seconds and repeat Steps 1 through 7 at least 10 times every 1-2 hours when you are awake. Take your time and take a few normal breaths between deep breaths. 9. The spirometer may include an indicator to show your best effort. Use the indicator as a goal to work toward during each repetition. 10. After each set of 10 deep breaths, practice coughing to be sure your lungs are clear. If you have an incision (the cut made at the time of surgery), support your incision when coughing by placing a pillow or rolled up towels firmly against it. Once you are able to get out of bed, walk around indoors and cough well. You may stop using the incentive spirometer when instructed by your caregiver.  RISKS AND COMPLICATIONS  Take your time so you do not get  dizzy or light-headed.  If you are in pain, you may need to take or ask for pain medication before doing incentive spirometry. It is harder to take a deep breath if you are having pain. AFTER USE  Rest and breathe slowly and easily.  It can be helpful to keep track of a log of your progress. Your caregiver can provide you with a simple table  to help with this. If you are using the spirometer at home, follow these instructions: [name] IF:   You are having difficultly using the spirometer.  You have trouble using the spirometer as often as instructed.  Your pain medication is not giving enough relief while using the spirometer.  You develop fever of 100.5 F (38.1 C) or higher. SEEK IMMEDIATE MEDICAL CARE IF:   You cough up bloody sputum that had not been present before.  You develop fever of 102 F (38.9 C) or greater.  You develop worsening pain at or near the incision site. MAKE SURE YOU:   Understand these instructions.  Will watch your condition.  Will get help right away if you are not doing well or get worse. Document Released: 09/17/2006 Document Revised: 07/30/2011 Document Reviewed: 11/18/2006 Exit[employer] Patient Information 2014 Exit[employer], [place].   ________________________________________________________________________  WHAT IS A BLOOD TRANSFUSION? Blood Transfusion Information  A transfusion is the replacement of blood or some of its parts. Blood is made up of multiple cells which provide different functions.  Red blood cells carry oxygen and are used for blood loss replacement.  White blood cells fight against infection.  Platelets control bleeding.  Plasma helps clot blood.  Other blood products are available for specialized needs, such as hemophilia or other clotting disorders. BEFORE THE TRANSFUSION  Who gives blood for transfusions?   Healthy volunteers who are fully evaluated to make sure their blood is safe. This is blood bank blood. Transfusion therapy is the safest it has ever been in the practice of medicine. Before blood is taken from a donor, a complete history is taken to make sure that person has no history of diseases nor engages in risky social behavior (examples are intravenous drug use or sexual activity with multiple partners). The donor's travel history is screened  to minimize risk of transmitting infections, such as malaria. The donated blood is tested for signs of infectious diseases, such as HIV and hepatitis. The blood is then tested to be sure it is compatible with you in order to minimize the chance of a transfusion reaction. If you or a relative donates blood, this is often done in anticipation of surgery and is not appropriate for emergency situations. It takes many days to process the donated blood. RISKS AND COMPLICATIONS Although transfusion therapy is very safe and saves many lives, the main dangers of transfusion include:   Getting an infectious disease.  Developing a transfusion reaction. This is an allergic reaction to something in the blood you were given. Every precaution is taken to prevent this. The decision to have a blood transfusion has been considered carefully by your caregiver before blood is given. Blood is not given unless the benefits outweigh the risks. AFTER THE TRANSFUSION  Right after receiving a blood transfusion, you will usually feel much better and more energetic. This is especially true if your red blood cells have gotten low (anemic). The transfusion raises the level of the red blood cells which carry oxygen, and this usually causes an energy increase.  The nurse administering the transfusion will  monitor you carefully for complications. HOME CARE INSTRUCTIONS  No special instructions are needed after a transfusion. You may find your energy is better. Speak with your caregiver about any limitations on activity for underlying diseases you may have. SEEK MEDICAL CARE IF:   Your condition is not improving after your transfusion.  You develop redness or irritation at the intravenous (IV) site. SEEK IMMEDIATE MEDICAL CARE IF:  Any of the following symptoms occur over the next 12 hours:  Shaking chills.  You have a temperature by mouth above 102 F (38.9 C), not controlled by medicine.  Chest, back, or muscle  pain.  People around you feel you are not acting correctly or are confused.  Shortness of breath or difficulty breathing.  Dizziness and fainting.  You get a rash or develop hives.  You have a decrease in urine output.  Your urine turns a dark color or changes to pink, red, or brown. Any of the following symptoms occur over the next 10 days:  You have a temperature by mouth above 102 F (38.9 C), not controlled by medicine.  Shortness of breath.  Weakness after normal activity.  The white part of the eye turns yellow (jaundice).  You have a decrease in the amount of urine or are urinating less often.  Your urine turns a dark color or changes to pink, red, or brown. Document Released: 05/04/2000 Document Revised: 07/30/2011 Document Reviewed: 12/22/2007 [facility] Patient Information 2014 [place], [place].  _______________________________________________________________________

## 2017-10-16 ENCOUNTER — Encounter (HOSPITAL_COMMUNITY): Payer: Self-pay

## 2017-10-16 ENCOUNTER — Other Ambulatory Visit: Payer: Self-pay

## 2017-10-16 ENCOUNTER — Encounter (HOSPITAL_COMMUNITY)
Admission: RE | Admit: 2017-10-16 | Discharge: 2017-10-16 | Disposition: A | Discharge status: Home / Self Care | Payer: Medicare Other | Source: Ambulatory Visit | Attending: Orthopedic Surgery | Admitting: Orthopedic Surgery

## 2017-10-16 DIAGNOSIS — Z01812 Encounter for preprocedural laboratory examination: Secondary | ICD-10-CM | POA: Insufficient documentation

## 2017-10-16 DIAGNOSIS — I10 Essential (primary) hypertension: Secondary | ICD-10-CM | POA: Insufficient documentation

## 2017-10-16 DIAGNOSIS — R001 Bradycardia, unspecified: Secondary | ICD-10-CM | POA: Diagnosis not present

## 2017-10-16 DIAGNOSIS — Z01810 Encounter for preprocedural cardiovascular examination: Secondary | ICD-10-CM | POA: Diagnosis not present

## 2017-10-16 HISTORY — DX: Personal history of urinary calculi: Z87.442

## 2017-10-16 LAB — CBC
HEMATOCRIT: 43.4 % (ref 39.0–52.0)
Hemoglobin: 15 g/dL (ref 13.0–17.0)
MCH: 31.8 pg (ref 26.0–34.0)
MCHC: 34.6 g/dL (ref 30.0–36.0)
MCV: 91.9 fL (ref 78.0–100.0)
PLATELETS: 158 10*3/uL (ref 150–400)
RBC: 4.72 MIL/uL (ref 4.22–5.81)
RDW: 14 % (ref 11.5–15.5)
WBC: 6.2 10*3/uL (ref 4.0–10.5)

## 2017-10-16 LAB — BASIC METABOLIC PANEL
Anion gap: 10 (ref 5–15)
BUN: 21 mg/dL — AB (ref 6–20)
CO2: 30 mmol/L (ref 22–32)
CREATININE: 1.12 mg/dL (ref 0.61–1.24)
Calcium: 9.4 mg/dL (ref 8.9–10.3)
Chloride: 104 mmol/L (ref 101–111)
GFR calc Af Amer: >60 mL/min (ref >60)
GLUCOSE: 113 mg/dL — AB (ref 65–99)
POTASSIUM: 3.7 mmol/L (ref 3.5–5.1)
SODIUM: 144 mmol/L (ref 135–145)

## 2017-10-16 LAB — SURGICAL PCR SCREEN
MRSA, PCR: NEGATIVE
STAPHYLOCOCCUS AUREUS: NEGATIVE

## 2017-10-18 ENCOUNTER — Other Ambulatory Visit: Payer: Self-pay | Admitting: Orthopedic Surgery

## 2017-10-18 NOTE — Care Plan (Signed)
Ortho Bundle L THA scheduled on 10-22-17 DCP:  Home with spouse and dtr.  1 story home with 1/2 ste.   DME:  No needs, has a RW and 3-in-1 PT:  HEP

## 2017-10-21 MED ORDER — TRANEXAMIC ACID 1000 MG/10ML IV SOLN
1000.0000 mg | INTRAVENOUS | Status: AC
  Administered 2017-10-22: 1000 mg via INTRAVENOUS
  Filled 2017-10-21: qty 10

## 2017-10-22 ENCOUNTER — Inpatient Hospital Stay (HOSPITAL_COMMUNITY)
Admission: RE | Admit: 2017-10-22 | Discharge: 2017-10-23 | DRG: 470 | Disposition: A | Discharge status: Home / Self Care | Payer: Medicare Other | Source: Ambulatory Visit | Attending: Orthopedic Surgery | Admitting: Orthopedic Surgery

## 2017-10-22 ENCOUNTER — Inpatient Hospital Stay (HOSPITAL_COMMUNITY): Payer: Medicare Other

## 2017-10-22 ENCOUNTER — Encounter (HOSPITAL_COMMUNITY): Payer: Self-pay | Admitting: *Deleted

## 2017-10-22 ENCOUNTER — Inpatient Hospital Stay (HOSPITAL_COMMUNITY): Payer: Medicare Other | Admitting: Anesthesiology

## 2017-10-22 ENCOUNTER — Other Ambulatory Visit: Payer: Self-pay

## 2017-10-22 ENCOUNTER — Encounter (HOSPITAL_COMMUNITY)
Admission: RE | Disposition: A | Discharge status: Home / Self Care | Payer: Self-pay | Source: Ambulatory Visit | Attending: Orthopedic Surgery

## 2017-10-22 DIAGNOSIS — H9193 Unspecified hearing loss, bilateral: Secondary | ICD-10-CM | POA: Diagnosis present

## 2017-10-22 DIAGNOSIS — Z79899 Other long term (current) drug therapy: Secondary | ICD-10-CM | POA: Diagnosis not present

## 2017-10-22 DIAGNOSIS — Z7982 Long term (current) use of aspirin: Secondary | ICD-10-CM

## 2017-10-22 DIAGNOSIS — Z96642 Presence of left artificial hip joint: Secondary | ICD-10-CM

## 2017-10-22 DIAGNOSIS — M1612 Unilateral primary osteoarthritis, left hip: Secondary | ICD-10-CM | POA: Diagnosis not present

## 2017-10-22 DIAGNOSIS — E785 Hyperlipidemia, unspecified: Secondary | ICD-10-CM | POA: Diagnosis not present

## 2017-10-22 DIAGNOSIS — K219 Gastro-esophageal reflux disease without esophagitis: Secondary | ICD-10-CM | POA: Diagnosis present

## 2017-10-22 DIAGNOSIS — Z885 Allergy status to narcotic agent status: Secondary | ICD-10-CM | POA: Diagnosis not present

## 2017-10-22 DIAGNOSIS — Z471 Aftercare following joint replacement surgery: Secondary | ICD-10-CM | POA: Diagnosis not present

## 2017-10-22 DIAGNOSIS — I10 Essential (primary) hypertension: Secondary | ICD-10-CM | POA: Diagnosis present

## 2017-10-22 DIAGNOSIS — G4730 Sleep apnea, unspecified: Secondary | ICD-10-CM | POA: Diagnosis not present

## 2017-10-22 DIAGNOSIS — Z96641 Presence of right artificial hip joint: Secondary | ICD-10-CM | POA: Diagnosis not present

## 2017-10-22 DIAGNOSIS — Z96649 Presence of unspecified artificial hip joint: Secondary | ICD-10-CM

## 2017-10-22 HISTORY — PX: TOTAL HIP ARTHROPLASTY: SHX124

## 2017-10-22 LAB — TYPE AND SCREEN
ABO/RH(D): O POS
ANTIBODY SCREEN: NEGATIVE

## 2017-10-22 SURGERY — ARTHROPLASTY, HIP, TOTAL, ANTERIOR APPROACH
Anesthesia: Spinal | Site: Hip | Laterality: Left

## 2017-10-22 MED ORDER — POLYETHYLENE GLYCOL 3350 17 G PO PACK
17.0000 g | PACK | Freq: Two times a day (BID) | ORAL | Status: DC
  Administered 2017-10-23: 17 g via ORAL
  Filled 2017-10-22: qty 1

## 2017-10-22 MED ORDER — PHENYLEPHRINE 40 MCG/ML (10ML) SYRINGE FOR IV PUSH (FOR BLOOD PRESSURE SUPPORT)
PREFILLED_SYRINGE | INTRAVENOUS | Status: AC
  Filled 2017-10-22: qty 10

## 2017-10-22 MED ORDER — ASPIRIN 81 MG PO CHEW
81.0000 mg | CHEWABLE_TABLET | Freq: Two times a day (BID) | ORAL | Status: DC
  Administered 2017-10-22 – 2017-10-23 (×2): 81 mg via ORAL
  Filled 2017-10-22 (×2): qty 1

## 2017-10-22 MED ORDER — METOCLOPRAMIDE HCL 5 MG PO TABS: 5.0000 mg | ORAL_TABLET | Freq: Three times a day (TID) | ORAL | Status: DC | PRN

## 2017-10-22 MED ORDER — FENTANYL CITRATE (PF) 100 MCG/2ML IJ SOLN
25.0000 ug | INTRAMUSCULAR | Status: DC | PRN
  Administered 2017-10-22: 50 ug via INTRAVENOUS
  Filled 2017-10-22: qty 2

## 2017-10-22 MED ORDER — BUPIVACAINE IN DEXTROSE 0.75-8.25 % IT SOLN
INTRATHECAL | Status: DC | PRN
  Administered 2017-10-22: 2 mL via INTRATHECAL

## 2017-10-22 MED ORDER — LIDOCAINE 2% (20 MG/ML) 5 ML SYRINGE
INTRAMUSCULAR | Status: DC | PRN
  Administered 2017-10-22: 50 mg via INTRAVENOUS

## 2017-10-22 MED ORDER — ALUM & MAG HYDROXIDE-SIMETH 200-200-20 MG/5ML PO SUSP: 15.0000 mL | ORAL | Status: DC | PRN

## 2017-10-22 MED ORDER — DEXAMETHASONE SODIUM PHOSPHATE 10 MG/ML IJ SOLN
10.0000 mg | Freq: Once | INTRAMUSCULAR | Status: AC
  Administered 2017-10-23: 10 mg via INTRAVENOUS
  Filled 2017-10-22: qty 1

## 2017-10-22 MED ORDER — ONDANSETRON HCL 4 MG/2ML IJ SOLN
4.0000 mg | Freq: Four times a day (QID) | INTRAMUSCULAR | Status: DC | PRN
Start: 2017-10-22 — End: 2017-10-23

## 2017-10-22 MED ORDER — SODIUM CHLORIDE 0.9 % IV SOLN
INTRAVENOUS | Status: DC
  Administered 2017-10-22 (×2): via INTRAVENOUS

## 2017-10-22 MED ORDER — LIDOCAINE 2% (20 MG/ML) 5 ML SYRINGE
INTRAMUSCULAR | Status: AC
  Filled 2017-10-22: qty 5

## 2017-10-22 MED ORDER — TRANEXAMIC ACID 1000 MG/10ML IV SOLN
1000.0000 mg | Freq: Once | INTRAVENOUS | Status: AC
  Administered 2017-10-22: 1000 mg via INTRAVENOUS
  Filled 2017-10-22: qty 1100

## 2017-10-22 MED ORDER — HYDROMORPHONE HCL 1 MG/ML IJ SOLN
0.5000 mg | INTRAMUSCULAR | Status: DC | PRN
  Administered 2017-10-22 (×2): 1 mg via INTRAVENOUS
  Filled 2017-10-22 (×2): qty 1

## 2017-10-22 MED ORDER — PHENOL 1.4 % MT LIQD: 1.0000 | OROMUCOSAL | Status: DC | PRN

## 2017-10-22 MED ORDER — CELECOXIB 200 MG PO CAPS
200.0000 mg | ORAL_CAPSULE | Freq: Two times a day (BID) | ORAL | Status: DC
  Administered 2017-10-22 – 2017-10-23 (×2): 200 mg via ORAL
  Filled 2017-10-22 (×2): qty 1

## 2017-10-22 MED ORDER — METHOCARBAMOL 500 MG PO TABS: 500.0000 mg | ORAL_TABLET | Freq: Four times a day (QID) | ORAL | 0 refills | Status: DC | PRN

## 2017-10-22 MED ORDER — STERILE WATER FOR IRRIGATION IR SOLN
Status: DC | PRN
  Administered 2017-10-22: 1000 mL

## 2017-10-22 MED ORDER — CEFAZOLIN SODIUM-DEXTROSE 2-4 GM/100ML-% IV SOLN
2.0000 g | Freq: Four times a day (QID) | INTRAVENOUS | Status: AC
  Administered 2017-10-22 (×2): 2 g via INTRAVENOUS
  Filled 2017-10-22 (×2): qty 100

## 2017-10-22 MED ORDER — POLYETHYLENE GLYCOL 3350 17 G PO PACK: 17.0000 g | PACK | Freq: Two times a day (BID) | ORAL | 0 refills | Status: DC

## 2017-10-22 MED ORDER — DIPHENHYDRAMINE HCL 12.5 MG/5ML PO ELIX: 12.5000 mg | ORAL_SOLUTION | ORAL | Status: DC | PRN

## 2017-10-22 MED ORDER — FENTANYL CITRATE (PF) 100 MCG/2ML IJ SOLN
INTRAMUSCULAR | Status: DC | PRN
  Administered 2017-10-22 (×2): 50 ug via INTRAVENOUS

## 2017-10-22 MED ORDER — MEPERIDINE HCL 50 MG/ML IJ SOLN: 6.2500 mg | INTRAMUSCULAR | Status: DC | PRN

## 2017-10-22 MED ORDER — ONDANSETRON HCL 4 MG/2ML IJ SOLN
INTRAMUSCULAR | Status: AC
  Filled 2017-10-22: qty 2

## 2017-10-22 MED ORDER — PROPOFOL 10 MG/ML IV BOLUS
INTRAVENOUS | Status: AC
  Filled 2017-10-22: qty 40

## 2017-10-22 MED ORDER — METOCLOPRAMIDE HCL 5 MG/ML IJ SOLN: 10.0000 mg | Freq: Once | INTRAMUSCULAR | Status: DC | PRN

## 2017-10-22 MED ORDER — ATORVASTATIN CALCIUM 20 MG PO TABS
20.0000 mg | ORAL_TABLET | Freq: Every day | ORAL | Status: DC
  Administered 2017-10-22: 20 mg via ORAL
  Filled 2017-10-22: qty 1

## 2017-10-22 MED ORDER — EPHEDRINE SULFATE 50 MG/ML IJ SOLN
INTRAMUSCULAR | Status: AC
  Filled 2017-10-22: qty 1

## 2017-10-22 MED ORDER — DEXAMETHASONE SODIUM PHOSPHATE 10 MG/ML IJ SOLN
10.0000 mg | Freq: Once | INTRAMUSCULAR | Status: AC
  Administered 2017-10-22: 10 mg via INTRAVENOUS

## 2017-10-22 MED ORDER — MENTHOL 3 MG MT LOZG: 1.0000 | LOZENGE | OROMUCOSAL | Status: DC | PRN

## 2017-10-22 MED ORDER — CHLORHEXIDINE GLUCONATE 4 % EX LIQD: 60.0000 mL | Freq: Once | CUTANEOUS | Status: DC

## 2017-10-22 MED ORDER — PROPOFOL 500 MG/50ML IV EMUL
INTRAVENOUS | Status: DC | PRN
  Administered 2017-10-22: 100 ug/kg/min via INTRAVENOUS

## 2017-10-22 MED ORDER — CEFAZOLIN SODIUM-DEXTROSE 2-4 GM/100ML-% IV SOLN
2.0000 g | INTRAVENOUS | Status: AC
  Administered 2017-10-22: 2 g via INTRAVENOUS
  Filled 2017-10-22: qty 100

## 2017-10-22 MED ORDER — ONDANSETRON HCL 4 MG PO TABS: 4.0000 mg | ORAL_TABLET | Freq: Four times a day (QID) | ORAL | Status: DC | PRN

## 2017-10-22 MED ORDER — FENTANYL CITRATE (PF) 100 MCG/2ML IJ SOLN: 25.0000 ug | INTRAMUSCULAR | Status: DC | PRN

## 2017-10-22 MED ORDER — SODIUM CHLORIDE 0.9 % IR SOLN
Status: DC | PRN
  Administered 2017-10-22: 2000 mL

## 2017-10-22 MED ORDER — HYDROCHLOROTHIAZIDE 12.5 MG PO CAPS
12.5000 mg | ORAL_CAPSULE | Freq: Every day | ORAL | Status: DC
Start: 2017-10-22 — End: 2017-10-23
  Administered 2017-10-22: 12.5 mg via ORAL
  Filled 2017-10-22: qty 1

## 2017-10-22 MED ORDER — FENTANYL CITRATE (PF) 100 MCG/2ML IJ SOLN
INTRAMUSCULAR | Status: AC
  Filled 2017-10-22: qty 2

## 2017-10-22 MED ORDER — ACETAMINOPHEN 325 MG PO TABS: 325.0000 mg | ORAL_TABLET | Freq: Four times a day (QID) | ORAL | Status: DC | PRN

## 2017-10-22 MED ORDER — EPHEDRINE SULFATE-NACL 50-0.9 MG/10ML-% IV SOSY
PREFILLED_SYRINGE | INTRAVENOUS | Status: DC | PRN
  Administered 2017-10-22 (×4): 10 mg via INTRAVENOUS

## 2017-10-22 MED ORDER — FERROUS SULFATE 325 (65 FE) MG PO TABS: 325.0000 mg | ORAL_TABLET | Freq: Three times a day (TID) | ORAL | 3 refills | Status: DC

## 2017-10-22 MED ORDER — FERROUS SULFATE 325 (65 FE) MG PO TABS
325.0000 mg | ORAL_TABLET | Freq: Three times a day (TID) | ORAL | Status: DC
  Administered 2017-10-23 (×2): 325 mg via ORAL
  Filled 2017-10-22 (×2): qty 1

## 2017-10-22 MED ORDER — ASPIRIN 81 MG PO CHEW: 81.0000 mg | CHEWABLE_TABLET | Freq: Two times a day (BID) | ORAL | 0 refills | Status: AC

## 2017-10-22 MED ORDER — LACTATED RINGERS IV SOLN: INTRAVENOUS | Status: DC

## 2017-10-22 MED ORDER — PHENYLEPHRINE 40 MCG/ML (10ML) SYRINGE FOR IV PUSH (FOR BLOOD PRESSURE SUPPORT)
PREFILLED_SYRINGE | INTRAVENOUS | Status: DC | PRN
  Administered 2017-10-22 (×2): 80 ug via INTRAVENOUS

## 2017-10-22 MED ORDER — HYDROCODONE-ACETAMINOPHEN 5-325 MG PO TABS
1.0000 | ORAL_TABLET | ORAL | Status: DC | PRN
  Administered 2017-10-23 (×2): 2 via ORAL
  Filled 2017-10-22 (×2): qty 2

## 2017-10-22 MED ORDER — DOCUSATE SODIUM 100 MG PO CAPS
100.0000 mg | ORAL_CAPSULE | Freq: Two times a day (BID) | ORAL | Status: DC
  Administered 2017-10-22 – 2017-10-23 (×2): 100 mg via ORAL
  Filled 2017-10-22 (×2): qty 1

## 2017-10-22 MED ORDER — METOCLOPRAMIDE HCL 5 MG/ML IJ SOLN: 5.0000 mg | Freq: Three times a day (TID) | INTRAMUSCULAR | Status: DC | PRN

## 2017-10-22 MED ORDER — VALSARTAN-HYDROCHLOROTHIAZIDE 80-12.5 MG PO TABS: 1.0000 | ORAL_TABLET | Freq: Every day | ORAL | Status: DC

## 2017-10-22 MED ORDER — HYDROCODONE-ACETAMINOPHEN 7.5-325 MG PO TABS
1.0000 | ORAL_TABLET | ORAL | Status: DC | PRN
  Administered 2017-10-22: 1 via ORAL
  Administered 2017-10-22 (×2): 2 via ORAL
  Filled 2017-10-22: qty 2
  Filled 2017-10-22: qty 1
  Filled 2017-10-22: qty 2

## 2017-10-22 MED ORDER — ONDANSETRON HCL 4 MG/2ML IJ SOLN
INTRAMUSCULAR | Status: DC | PRN
  Administered 2017-10-22: 4 mg via INTRAVENOUS

## 2017-10-22 MED ORDER — METHOCARBAMOL 1000 MG/10ML IJ SOLN
500.0000 mg | Freq: Four times a day (QID) | INTRAVENOUS | Status: DC | PRN
  Administered 2017-10-22: 500 mg via INTRAVENOUS
  Filled 2017-10-22: qty 550

## 2017-10-22 MED ORDER — IRBESARTAN 75 MG PO TABS
75.0000 mg | ORAL_TABLET | Freq: Every day | ORAL | Status: DC
  Administered 2017-10-22: 75 mg via ORAL
  Filled 2017-10-22: qty 1

## 2017-10-22 MED ORDER — DOCUSATE SODIUM 100 MG PO CAPS: 100.0000 mg | ORAL_CAPSULE | Freq: Two times a day (BID) | ORAL | 0 refills | Status: DC

## 2017-10-22 MED ORDER — LACTATED RINGERS IV SOLN
INTRAVENOUS | Status: DC
  Administered 2017-10-22 (×2): via INTRAVENOUS

## 2017-10-22 MED ORDER — MAGNESIUM CITRATE PO SOLN: 1.0000 | Freq: Once | ORAL | Status: DC | PRN

## 2017-10-22 MED ORDER — HYDROCODONE-ACETAMINOPHEN 7.5-325 MG PO TABS: 1.0000 | ORAL_TABLET | ORAL | 0 refills | Status: DC | PRN

## 2017-10-22 MED ORDER — DEXAMETHASONE SODIUM PHOSPHATE 10 MG/ML IJ SOLN
INTRAMUSCULAR | Status: AC
  Filled 2017-10-22: qty 1

## 2017-10-22 MED ORDER — METHOCARBAMOL 500 MG PO TABS
500.0000 mg | ORAL_TABLET | Freq: Four times a day (QID) | ORAL | Status: DC | PRN
Start: 2017-10-22 — End: 2017-10-23

## 2017-10-22 MED ORDER — BISACODYL 10 MG RE SUPP: 10.0000 mg | Freq: Every day | RECTAL | Status: DC | PRN

## 2017-10-22 SURGICAL SUPPLY — 39 items
BLADE SAG 18X100X1.27 (BLADE) ×3 IMPLANT
CAPT HIP TOTAL 2 ×3 IMPLANT
COVER PERINEAL POST (MISCELLANEOUS) ×3 IMPLANT
COVER SURGICAL LIGHT HANDLE (MISCELLANEOUS) ×3 IMPLANT
DERMABOND ADVANCED (GAUZE/BANDAGES/DRESSINGS) ×2
DERMABOND ADVANCED .7 DNX12 (GAUZE/BANDAGES/DRESSINGS) ×1 IMPLANT
DRAPE STERI IOBAN 125X83 (DRAPES) ×3 IMPLANT
DRAPE U-SHAPE 47X51 STRL (DRAPES) ×6 IMPLANT
DRESSING AQUACEL AG SP 3.5X10 (GAUZE/BANDAGES/DRESSINGS) ×1 IMPLANT
DRSG AQUACEL AG SP 3.5X10 (GAUZE/BANDAGES/DRESSINGS) ×3
DURAPREP 26ML APPLICATOR (WOUND CARE) ×3 IMPLANT
ELECT REM PT RETURN 15FT ADLT (MISCELLANEOUS) ×3 IMPLANT
GLOVE BIO SURGEON STRL SZ 6 (GLOVE) ×3 IMPLANT
GLOVE BIOGEL M STRL SZ7.5 (GLOVE) ×6 IMPLANT
GLOVE BIOGEL PI IND STRL 6.5 (GLOVE) ×1 IMPLANT
GLOVE BIOGEL PI IND STRL 7.0 (GLOVE) ×2 IMPLANT
GLOVE BIOGEL PI IND STRL 7.5 (GLOVE) ×3 IMPLANT
GLOVE BIOGEL PI IND STRL 8.5 (GLOVE) ×1 IMPLANT
GLOVE BIOGEL PI INDICATOR 6.5 (GLOVE) ×2
GLOVE BIOGEL PI INDICATOR 7.0 (GLOVE) ×4
GLOVE BIOGEL PI INDICATOR 7.5 (GLOVE) ×6
GLOVE BIOGEL PI INDICATOR 8.5 (GLOVE) ×2
GLOVE ECLIPSE 8.0 STRL XLNG CF (GLOVE) ×6 IMPLANT
GLOVE ORTHO TXT STRL SZ7.5 (GLOVE) ×3 IMPLANT
GLOVE SURG SS PI 7.0 STRL IVOR (GLOVE) ×3 IMPLANT
GOWN STRL REUS W/ TWL LRG LVL3 (GOWN DISPOSABLE) ×3 IMPLANT
GOWN STRL REUS W/ TWL XL LVL3 (GOWN DISPOSABLE) ×2 IMPLANT
GOWN STRL REUS W/TWL LRG LVL3 (GOWN DISPOSABLE) ×9 IMPLANT
GOWN STRL REUS W/TWL XL LVL3 (GOWN DISPOSABLE) ×4
HOLDER FOLEY CATH W/STRAP (MISCELLANEOUS) ×3 IMPLANT
PACK ANTERIOR HIP CUSTOM (KITS) ×3 IMPLANT
SUT MNCRL AB 4-0 PS2 18 (SUTURE) ×3 IMPLANT
SUT STRATAFIX 0 PDS 27 VIOLET (SUTURE) ×3
SUT VIC AB 1 CT1 36 (SUTURE) ×9 IMPLANT
SUT VIC AB 2-0 CT1 27 (SUTURE) ×4
SUT VIC AB 2-0 CT1 TAPERPNT 27 (SUTURE) ×2 IMPLANT
SUTURE STRATFX 0 PDS 27 VIOLET (SUTURE) ×1 IMPLANT
TRAY FOLEY MTR SLVR 16FR STAT (SET/KITS/TRAYS/PACK) ×3 IMPLANT
YANKAUER SUCT BULB TIP 10FT TU (MISCELLANEOUS) ×3 IMPLANT

## 2017-10-22 NOTE — Op Note (Signed)
NAME:  [name]                ACCOUNT NO.: [id number]      MEDICAL RECORD NO.: [id number]      FACILITY:  [facility]      PHYSICIAN:  Mauri Pole  DATE OF BIRTH:  August 27, 1940     DATE OF PROCEDURE:  10/22/2017                                 OPERATIVE REPORT         PREOPERATIVE DIAGNOSIS: Left  hip osteoarthritis.      POSTOPERATIVE DIAGNOSIS:  Left hip osteoarthritis.      PROCEDURE:  Left total hip replacement through an anterior approach   utilizing DePuy THR system, component size 14mm pinnacle cup, a size 36+4 neutral   Altrex liner, a size 7Hi Actis stem with a 36+1.5 Articuleze metal ball.      SURGEON:  Pietro Cassis. Alvan Dame, M.D.      ASSISTANT:  Nehemiah Massed, PA-C     ANESTHESIA:  Spinal.      SPECIMENS:  None.      COMPLICATIONS:  None.      BLOOD LOSS:  600 cc     DRAINS:  None.      INDICATION OF THE PROCEDURE:  [name] is a 77 y.o. male who had   presented to office for evaluation of left hip pain.  Radiographs revealed   progressive degenerative changes with bone-on-bone   articulation to the  hip joint.  The patient had painful limited range of   motion significantly affecting their overall quality of life.  The patient was failing to    respond to conservative measures, and at this point was ready   to proceed with more definitive measures.  The patient has noted progressive   degenerative changes in his hip, progressive problems and dysfunction   with regarding the hip prior to surgery.  Consent was obtained for   benefit of pain relief.  Specific risk of infection, DVT, component   failure, dislocation, need for revision surgery, as well discussion of   the anterior versus posterior approach were reviewed.  Consent was   obtained for benefit of anterior pain relief through an anterior   approach.      PROCEDURE IN DETAIL:  The patient was brought to operative theater.   Once adequate anesthesia, preoperative  antibiotics, 2 gm of Ancef, 1 gm of Tranexamic Acid, and 10 mg of Decadron administered.   The patient was positioned supine on the OSI Hanna table.  Once adequate   padding of boney process was carried out, we had predraped out the hip, and  used fluoroscopy to confirm orientation of the pelvis and position.      The left hip was then prepped and draped from proximal iliac crest to   mid thigh with shower curtain technique.      Time-out was performed identifying the patient, planned procedure, and   extremity.     An incision was then made 2 cm distal and lateral to the   anterior superior iliac spine extending over the orientation of the   tensor fascia lata muscle and sharp dissection was carried down to the   fascia of the muscle and protractor placed in the soft tissues.      The fascia was then incised.  The  muscle belly was identified and swept   laterally and retractor placed along the superior neck.  Following   cauterization of the circumflex vessels and removing some pericapsular   fat, a second cobra retractor was placed on the inferior neck.  A third   retractor was placed on the anterior acetabulum after elevating the   anterior rectus.  A L-capsulotomy was along the line of the   superior neck to the trochanteric fossa, then extended proximally and   distally.  Tag sutures were placed and the retractors were then placed   intracapsular.  We then identified the trochanteric fossa and   orientation of my neck cut, confirmed this radiographically   and then made a neck osteotomy with the femur on traction.  The femoral   head was removed without difficulty or complication.  Traction was let   off and retractors were placed posterior and anterior around the   acetabulum.      The labrum and foveal tissue were debrided.  I began reaming with a 70mm   reamer and reamed up to 50mm reamer with good bony bed preparation and a 16mm   cup was chosen.  The final 40mm Pinnacle cup  was then impacted under fluoroscopy  to confirm the depth of penetration and orientation with respect to   abduction.  A screw was placed followed by the hole eliminator.  The final   36+4 neutral Altrex liner was impacted with good visualized rim fit.  The cup was positioned anatomically within the acetabular portion of the pelvis.      At this point, the femur was rolled at 80 degrees.  Further capsule was   released off the inferior aspect of the femoral neck.  I then   released the superior capsule proximally.  The hook was placed laterally   along the femur and elevated manually and held in position with the bed   hook.  The leg was then extended and adducted with the leg rolled to 100   degrees of external rotation.  Once the proximal femur was fully   exposed, I used a box osteotome to set orientation.  I then began   broaching with the starting chili pepper broach and passed this by hand and then broached up to 7.  With the 7 broach in place I chose a high offset neck and did several trial reductions.  The offset was appropriate, leg lengths   appeared to be equal best matched with the +1.5 head ball confirmed radiographically.   Given these findings, I went ahead and dislocated the hip, repositioned all   retractors and positioned the right hip in the extended and abducted position.  The final 7 Hi Actis stem was   chosen and it was impacted down to the level of neck cut.  Based on this   and the trial reduction, a 36+1.5 delta ceramic ball was chosen and   impacted onto a clean and dry trunnion, and the hip was reduced.  The   hip had been irrigated throughout the case again at this point.  I did   reapproximate the superior capsular leaflet to the anterior leaflet   using #1 Vicryl.  The fascia of the   tensor fascia lata muscle was then reapproximated using #1 Vicryl and #0 Stratafix sutures.  The   remaining wound was closed with 2-0 Vicryl and running 4-0 Monocryl.   The hip was  cleaned, dried, and dressed sterilely using Dermabond and  Aquacel dressing.  He was then brought   to recovery room in stable condition tolerating the procedure well.    Nehemiah Massed, PA-C was present for the entirety of the case involved from   preoperative positioning, perioperative retractor management, general   facilitation of the case, as well as primary wound closure as assistant.            Pietro Cassis Alvan Dame, M.D.        10/22/2017 10:12 AM

## 2017-10-22 NOTE — Anesthesia Preprocedure Evaluation (Signed)
Anesthesia Evaluation  Patient identified by MRN, date of birth, ID band Patient awake    Reviewed: Allergy & Precautions, NPO status , Patient's Chart, lab work & pertinent test results  Airway Mallampati: II  TM Distance: >3 FB Neck ROM: Full    Dental no notable dental hx.    Pulmonary sleep apnea and Continuous Positive Airway Pressure Ventilation ,    Pulmonary exam normal breath sounds clear to auscultation       Cardiovascular hypertension, Pt. on medications Normal cardiovascular exam Rhythm:Regular Rate:Normal     Neuro/Psych negative neurological ROS  negative psych ROS   GI/Hepatic negative GI ROS, Neg liver ROS,   Endo/Other  negative endocrine ROS  Renal/GU negative Renal ROS  negative genitourinary   Musculoskeletal negative musculoskeletal ROS (+)   Abdominal   Peds negative pediatric ROS (+)  Hematology negative hematology ROS (+)   Anesthesia Other Findings   Reproductive/Obstetrics negative OB ROS                             Anesthesia Physical  Anesthesia Plan  ASA: III  Anesthesia Plan: Spinal   Post-op Pain Management:    Induction: Intravenous  PONV Risk Score and Plan: 1 and Treatment may vary due to age or medical condition  Airway Management Planned: Simple Face Mask  Additional Equipment:   Intra-op Plan:   Post-operative Plan:   Informed Consent: I have reviewed the patients History and Physical, chart, labs and discussed the procedure including the risks, benefits and alternatives for the proposed anesthesia with the patient or authorized representative who has indicated his/her understanding and acceptance.   Dental advisory given  Plan Discussed with: CRNA and Surgeon  Anesthesia Plan Comments:         Anesthesia Quick Evaluation

## 2017-10-22 NOTE — Anesthesia Procedure Notes (Signed)
Spinal  Patient location during procedure: OR End time: 10/22/2017 8:42 AM Staffing Resident/CRNA: Noralyn Pick D, CRNA Performed: anesthesiologist and resident/CRNA  Preanesthetic Checklist Completed: patient identified, site marked, surgical consent, pre-op evaluation, timeout performed, IV checked, risks and benefits discussed and monitors and equipment checked Spinal Block Patient position: sitting Prep: Betadine Patient monitoring: heart rate, continuous pulse ox and blood pressure Approach: midline Location: L2-3 Injection technique: single-shot Needle Needle type: Sprotte  Needle gauge: 24 G Needle length: 9 cm Assessment Sensory level: T6 Additional Notes Expiration date of kit checked and confirmed. Patient tolerated procedure well, without complications.

## 2017-10-22 NOTE — Discharge Instructions (Signed)

## 2017-10-22 NOTE — Transfer of Care (Signed)
Immediate Anesthesia Transfer of Care Note  Patient: [name]  Procedure(s) Performed: LEFT TOTAL HIP ARTHROPLASTY ANTERIOR APPROACH (Left Hip)  Patient Location: PACU  Anesthesia Type:Spinal  Level of Consciousness: sedated  Airway & Oxygen Therapy: Patient Spontanous Breathing and Patient connected to face mask oxygen  Post-op Assessment: Report given to RN and Post -op Vital signs reviewed and stable  Post vital signs: Reviewed and stable  Last Vitals:  Vitals Value Taken Time  BP 130/71 10/22/2017 10:43 AM  Temp    Pulse 80 10/22/2017 10:44 AM  Resp 14 10/22/2017 10:44 AM  SpO2 100 % 10/22/2017 10:44 AM  Vitals shown include unvalidated device data.  Last Pain:  Vitals:   10/22/17 0622  TempSrc: Oral         Complications: No apparent anesthesia complications

## 2017-10-22 NOTE — Progress Notes (Signed)
Pt. has own CPAP at bedside, had humidifier filled with s/w prior to my arrival, currently on room air, made aware to notify if needed.

## 2017-10-22 NOTE — Plan of Care (Signed)
Reviewed plan of care, specifically pain control measures, safety precautions, IS use, and importance of notifying staff with any questions or concerns. Pt and family attentive to all education and verbalized understanding.

## 2017-10-22 NOTE — Anesthesia Postprocedure Evaluation (Signed)
Anesthesia Post Note  Patient: [name]  Procedure(s) Performed: LEFT TOTAL HIP ARTHROPLASTY ANTERIOR APPROACH (Left Hip)     Patient location during evaluation: PACU Anesthesia Type: Spinal Level of consciousness: awake and alert Pain management: pain level controlled Vital Signs Assessment: post-procedure vital signs reviewed and stable Respiratory status: spontaneous breathing and respiratory function stable Cardiovascular status: blood pressure returned to baseline and stable Postop Assessment: no headache, no backache, spinal receding and no apparent nausea or vomiting Anesthetic complications: no    Last Vitals:  Vitals:   10/22/17 1340 10/22/17 1445  BP: (!) 153/80 (!) 156/87  Pulse: 93 84  Resp: 16   Temp: 36.5 C 36.6 C  SpO2: 99% 96%    Last Pain:  Vitals:   10/22/17 1539  TempSrc:   PainSc: Asleep                 [name]

## 2017-10-22 NOTE — Interval H&P Note (Signed)
History and Physical Interval Note:  10/22/2017 6:59 AM  [name]  has presented today for surgery, with the diagnosis of Left hip osteoarthritis  The various methods of treatment have been discussed with the patient and family. After consideration of risks, benefits and other options for treatment, the patient has consented to  Procedure(s): LEFT TOTAL HIP ARTHROPLASTY ANTERIOR APPROACH (Left) as a surgical intervention .  The patient's history has been reviewed, patient examined, no change in status, stable for surgery.  I have reviewed the patient's chart and labs.  Questions were answered to the patient's satisfaction.     Mauri Pole

## 2017-10-22 NOTE — Evaluation (Signed)
Physical Therapy Evaluation Patient Details Name: [name] MRN: [id number] DOB: 1940/07/05 Today's Date: 10/22/2017   History of Present Illness  pt s/p DA L THA 09/21/17 with h/o R DATHA 10/2015 .   Clinical Impression  Pt is s/p THA resulting in the deficits listed below (see PT Problem List). Pt will benefit from skilled PT to increase their independence and safety with mobility to allow discharge home with wife. Pt very tense and guarded during session with cues to relax. This did improve over time and pt seemed less anxious at the end. Will continue to need PT to educate family with HEP, walking and steps.      Follow Up Recommendations No PT follow up;Follow surgeon's recommendation for DC plan and follow-up therapies    Equipment Recommendations  None recommended by PT    Recommendations for Other Services       Precautions / Restrictions Precautions Precautions: None Precaution Comments: educated pt and family on no precautions with DA THA , they had asked a lot of questions and pt had about precautionsthey had heard prior to surgery.       Mobility  Bed Mobility Overal bed mobility: Needs Assistance Bed Mobility: Supine to Sit;Sit to Supine     Supine to sit: Min assist     General bed mobility comments: just cued on how to perfomr supine to sit and cues to relax. Pt tends to guard and tense up moving as one entire unit.  Transfers Overall transfer level: Needs assistance Equipment used: Rolling walker (2 wheeled) Transfers: Sit to/from Stand Sit to Stand: Min guard         General transfer comment: cues for safety and hand placement   Ambulation/Gait Ambulation/Gait assistance: Min assist;+2 safety/equipment Ambulation Distance (Feet): 15 Feet Assistive device: Rolling walker (2 wheeled) Gait Pattern/deviations: Step-to pattern     General Gait Details: limited distance due to patietn began to feel a little lightheaded. Pt very guarded with movements,  tense, encouraged to relax.   Stairs            Wheelchair Mobility    Modified Rankin (Stroke Patients Only)       Balance Overall balance assessment: Mild deficits observed, not formally tested                                           Pertinent Vitals/Pain Pain Assessment: 0-10 Pain Score: 4  Pain Location: L hip area Pain Descriptors / Indicators: Aching Pain Intervention(s): Monitored during session;Ice applied    Home Living Family/patient expects to be discharged to:: Private residence Living Arrangements: Spouse/significant other Available Help at Discharge: Family Type of Home: House Home Access: Stairs to enter Entrance Stairs-Rails: None Entrance Stairs-Number of Steps: 1 Home Layout: One level Home Equipment: Environmental consultant - 2 wheels;Cane - single point;Bedside commode;Shower seat      Prior Function Level of Independence: Independent with assistive device(s)         Comments: pt states he uses the single point cane most of the time . Has difficulty and slow to straighten up after sitting in car or recliner.      Hand Dominance        Extremity/Trunk Assessment        Lower Extremity Assessment Lower Extremity Assessment: Overall WFL for tasks assessed(tightiness noted in ability to preform hip ABduction )  Communication   Communication: HOH  Cognition Arousal/Alertness: Awake/alert Behavior During Therapy: WFL for tasks assessed/performed Overall Cognitive Status: Within Functional Limits for tasks assessed                                        General Comments      Exercises Total Joint Exercises Ankle Circles/Pumps: AROM;Both;10 reps;Supine Quad Sets: AROM;Left;5 reps;Supine Heel Slides: AAROM;Supine;5 reps;Left Hip ABduction/ADduction: AAROM;Supine;Left;5 reps(limited due to pain with this movement)   Assessment/Plan    PT Assessment Patient needs continued PT services  PT Problem List  Decreased mobility;Decreased strength;Decreased range of motion;Decreased activity tolerance       PT Treatment Interventions DME instruction;Therapeutic activities;Therapeutic exercise;Gait training;Stair training;Functional mobility training;Patient/family education    PT Goals (Current goals can be found in the Care Plan section)  Acute Rehab PT Goals Patient Stated Goal: I hope to get better and not have to use  the cane  PT Goal Formulation: With patient Time For Goal Achievement: 11/05/17 Potential to Achieve Goals: Good    Frequency 7X/week   Barriers to discharge        Co-evaluation               AM-PAC PT "6 Clicks" Daily Activity  Outcome Measure Difficulty turning over in bed (including adjusting bedclothes, sheets and blankets)?: Unable Difficulty moving from lying on back to sitting on the side of the bed? : Unable Difficulty sitting down on and standing up from a chair with arms (e.g., wheelchair, bedside commode, etc,.)?: A Little Help needed moving to and from a bed to chair (including a wheelchair)?: A Little Help needed walking in hospital room?: A Little Help needed climbing 3-5 steps with a railing? : A Lot 6 Click Score: 13    End of Session Equipment Utilized During Treatment: Gait belt Activity Tolerance: Patient tolerated treatment well Patient left: in chair;with chair alarm set Nurse Communication: Mobility status PT Visit Diagnosis: Other abnormalities of gait and mobility (R26.89)    Time: 9758-8325 PT Time Calculation (min) (ACUTE ONLY): 32 min   Charges:   PT Evaluation $PT Eval Low Complexity: 1 Low PT Treatments $Therapeutic Activity: 8-22 mins   PT G CodesClide Dales, PT Pager: [phone] 10/22/2017   [name], Gatha Mayer 10/22/2017, 6:06 PM

## 2017-10-22 NOTE — Progress Notes (Signed)
PT Cancellation Note  Patient Details Name: [name] MRN: [id number] DOB: [birth year]   Cancelled Treatment:    Reason Eval/Treat Not Completed: Pain limiting ability to participate(pt reported he's having too much pain to do PT at present. He stated he had severe pain (possibly spinal headache per RN) when he sat up after previous THA in 2017. Will follow. )   Philomena Doheny 10/22/2017, 2:21 PM [phone]

## 2017-10-23 LAB — BASIC METABOLIC PANEL
ANION GAP: 11 (ref 5–15)
BUN: 20 mg/dL (ref 6–20)
CALCIUM: 8.8 mg/dL — AB (ref 8.9–10.3)
CHLORIDE: 99 mmol/L — AB (ref 101–111)
CO2: 28 mmol/L (ref 22–32)
Creatinine, Ser: 1.2 mg/dL (ref 0.61–1.24)
GFR calc Af Amer: >60 mL/min (ref >60)
GFR calc non Af Amer: 57 mL/min — ABNORMAL LOW (ref >60)
Glucose, Bld: 178 mg/dL — ABNORMAL HIGH (ref 65–99)
POTASSIUM: 3.7 mmol/L (ref 3.5–5.1)
Sodium: 138 mmol/L (ref 135–145)

## 2017-10-23 LAB — CBC
HEMATOCRIT: 37.2 % — AB (ref 39.0–52.0)
Hemoglobin: 13.1 g/dL (ref 13.0–17.0)
MCH: 31.7 pg (ref 26.0–34.0)
MCHC: 35.2 g/dL (ref 30.0–36.0)
MCV: 90.1 fL (ref 78.0–100.0)
PLATELETS: 164 10*3/uL (ref 150–400)
RBC: 4.13 MIL/uL — AB (ref 4.22–5.81)
RDW: 14 % (ref 11.5–15.5)
WBC: 13.4 10*3/uL — AB (ref 4.0–10.5)

## 2017-10-23 NOTE — Progress Notes (Signed)
     Subjective: 1 Day Post-Op Procedure(s) (LRB): LEFT TOTAL HIP ARTHROPLASTY ANTERIOR APPROACH (Left)   Seen by Dr. Alvan Dame. Patient reports pain as mild, pain better controlled than yesterday.  No events throughout the night. Looking forward to progressing in recovery.  Ready to be discharged home.   Objective:   VITALS:   Vitals:   10/23/17 0254 10/23/17 0609  BP: 106/66 120/68  Pulse: 78 78  Resp: 16 18  Temp: 98 F (36.7 C) 98.3 F (36.8 C)  SpO2: 94% 96%    Dorsiflexion/Plantar flexion intact Incision: dressing C/D/I No cellulitis present Compartment soft  LABS Recent Labs    10/23/17 0532  HGB 13.1  HCT 37.2*  WBC 13.4*  PLT 164    Recent Labs    10/23/17 0532  NA 138  K 3.7  BUN 20  CREATININE 1.20  GLUCOSE 178*     Assessment/Plan: 1 Day Post-Op Procedure(s) (LRB): LEFT TOTAL HIP ARTHROPLASTY ANTERIOR APPROACH (Left) Foley cath d/c'ed Advance diet Up with therapy D/C IV fluids Discharge home Follow up in 2 weeks at [facility] ([place]). Follow up with OLIN,[name] D in 2 weeks.  Contact information:  [facility]) [address] [place] [phone]    Overweight (BMI 25-29.9) Estimated body mass index is 29.21 kg/m as calculated from the following:   Height as of this encounter: 5\' 6"  (1.676 m).   Weight as of this encounter: 82.1 kg (181 lb). Patient also counseled that weight may inhibit the healing process Patient counseled that losing weight will help with future health issues       West Pugh. [name]   PAC  10/23/2017, 7:58 AM

## 2017-10-23 NOTE — Progress Notes (Signed)
Physical Therapy Treatment Patient Details Name: [name] MRN: [id number] DOB: 1940/09/05 Today's Date: 10/23/2017    History of Present Illness pt s/p DA L THA 09/21/17 with h/o R DATHA 10/2015 .     PT Comments    Pt has met PT goals and is ready to DC home from PT standpoint. He ambulated 39' with RW no loss of balance, completed stair training, and demonstrates understanding of HEP.    Follow Up Recommendations  No PT follow up;Follow surgeon's recommendation for DC plan and follow-up therapies     Equipment Recommendations  None recommended by PT    Recommendations for Other Services       Precautions / Restrictions Precautions Precautions: Fall Restrictions Weight Bearing Restrictions: No Other Position/Activity Restrictions: WBAT    Mobility  Bed Mobility Overal bed mobility: Modified Independent Bed Mobility: Supine to Sit;Sit to Supine     Supine to sit: Min guard;HOB elevated     General bed mobility comments: instructed pt to self assist LLE with RLE  Transfers Overall transfer level: Needs assistance Equipment used: Rolling walker (2 wheeled) Transfers: Sit to/from Stand Sit to Stand: Supervision         General transfer comment: cues for safety and hand placement   Ambulation/Gait Ambulation/Gait assistance: Supervision Ambulation Distance (Feet): 275 Feet Assistive device: Rolling walker (2 wheeled) Gait Pattern/deviations: Step-to pattern Gait velocity: decr   General Gait Details: steady, no loss of balance, VCs sequencing initially   Stairs Stairs: Yes Stairs assistance: Min guard Stair Management: No rails;Backwards;With walker Number of Stairs: 1 General stair comments: VCs sequencing, wife/daughter present for stair training   Wheelchair Mobility    Modified Rankin (Stroke Patients Only)       Balance Overall balance assessment: Modified Independent                                           Cognition Arousal/Alertness: Awake/alert Behavior During Therapy: WFL for tasks assessed/performed Overall Cognitive Status: Within Functional Limits for tasks assessed                                        Exercises Total Joint Exercises Ankle Circles/Pumps: AROM;Both;10 reps;Supine Quad Sets: AROM;Left;5 reps;Supine Short Arc Quad: AROM;Left;10 reps;Supine Heel Slides: AAROM;Supine;Left;15 reps Hip ABduction/ADduction: AAROM;Supine;Left;10 reps Long Arc Quad: AROM;Left;10 reps;Seated    General Comments        Pertinent Vitals/Pain Pain Score: 2  Pain Location: L hip area Pain Descriptors / Indicators: Sore Pain Intervention(s): Limited activity within patient's tolerance;Monitored during session;Ice applied;Premedicated before session    Home Living                      Prior Function            PT Goals (current goals can now be found in the care plan section) Acute Rehab PT Goals 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 : A Little Difficulty sitting down on and standing up from a chair with arms (e.g., wheelchair, bedside commode, etc,.)?: A Little Help needed moving to and from a bed to chair (including a wheelchair)?: A Little Help needed walking in hospital room?: A Little Help needed climbing 3-5 steps with a railing? : A Little 6 Click Score: 19    End of Session Equipment Utilized During Treatment: Gait  belt Activity Tolerance: Patient tolerated treatment well Patient left: in chair;with family/visitor present Nurse Communication: Mobility status PT Visit Diagnosis: Other abnormalities of gait and mobility (R26.89)     Time: 0175-1025 PT Time Calculation (min) (ACUTE ONLY): 42 min  Charges:  $Gait Training: 8-22 mins $Therapeutic Exercise: 8-22 mins $Therapeutic Activity: 8-22 mins                    G Codes:          Philomena Doheny 10/23/2017, 10:27 AM [phone]

## 2017-10-24 NOTE — Discharge Summary (Signed)
Physician Discharge Summary  Patient ID: [name] MRN: [id number] DOB/AGE: [birth year] 77 y.o.  Admit date: 10/22/2017 Discharge date: 10/23/2017   Procedures:  Procedure(s) (LRB): LEFT TOTAL HIP ARTHROPLASTY ANTERIOR APPROACH (Left)  Attending Physician:  Dr. Paralee Cancel   Admission Diagnoses:   Left hip primary OA / pain  Discharge Diagnoses:  Principal Problem:   S/P left THA, AA Active Problems:   S/P right THA, AA  Past Medical History:  Diagnosis Date  . Arthritis    osteoarthritis right hip  . Carotid artery plaque    mild  . Colon polyps   . Erectile dysfunction   . GERD (gastroesophageal reflux disease)    history- no current problems  . History of kidney stones   . History of nephrolithiasis    episodes x 6   . HLD (hyperlipidemia)   . HTN (hypertension)   . Impaired hearing    bilateral hearing aids  . Sleep apnea    cpap used with nasal clip    HPI:    [name], 77 y.o. male, has a history of pain and functional disability in the left hip(s) due to arthritis and patient has failed non-surgical conservative treatments for greater than 12 weeks to include NSAID's and/or analgesics, use of assistive devices and activity modification.  Onset of symptoms was abrupt starting 6+ months ago with rapidlly worsening course since that time.The patient noted prior procedures of the hip to include arthroplasty on the right hip per Dr. Alvan Dame in 2017.  Patient currently rates pain in the left hip at 9 out of 10 with activity. Patient has worsening of pain with activity and weight bearing, trendelenberg gait, pain that interfers with activities of daily living and pain with passive range of motion. Patient has evidence of periarticular osteophytes and joint space narrowing by imaging studies. This condition presents safety issues increasing the risk of falls. There is no current active infection.  Risks, benefits and expectations were discussed with the patient.   Risks including but not limited to the risk of anesthesia, blood clots, nerve damage, blood vessel damage, failure of the prosthesis, infection and up to and including death.  Patient understand the risks, benefits and expectations and wishes to proceed with surgery.   PCP: Hali Marry, MD   Discharged Condition: good  Hospital Course:  Patient underwent the above stated procedure on 10/22/2017. Patient tolerated the procedure well and brought to the recovery room in good condition and subsequently to the floor.  POD #1 BP: 120/68 ; Pulse: 78 ; Temp: 98.3 F (36.8 C) ; Resp: 18 Patient reports pain as mild, pain better controlled than yesterday.  No events throughout the night. Looking forward to progressing in recovery.  Ready to be discharged home. Dorsiflexion/plantar flexion intact, incision: dressing C/D/I, no cellulitis present and compartment soft.   LABS  Basename    HGB     13.1  HCT     37.2    Discharge Exam: General appearance: alert, cooperative and no distress Extremities: Homans sign is negative, no sign of DVT, no edema, redness or tenderness in the calves or thighs and no ulcers, gangrene or trophic changes  Disposition: Home with follow up in 2 weeks   Follow-up Information    Paralee Cancel, MD. Schedule an appointment as soon as possible for a visit in 2 weeks.   Specialty:  Orthopedic Surgery Contact information: [address] [place] [address] [phone]  Discharge Instructions    Call MD / Call 911   Complete by:  As directed    If you experience chest pain or shortness of breath, CALL 911 and be transported to the hospital emergency room.  If you develope a fever above 101 F, pus (white drainage) or increased drainage or redness at the wound, or calf pain, call your surgeon's office.   Change dressing   Complete by:  As directed    Maintain surgical dressing until follow up in the clinic. If the edges start to  pull up, may reinforce with tape. If the dressing is no longer working, may remove and cover with gauze and tape, but must keep the area dry and clean.  Call with any questions or concerns.   Constipation Prevention   Complete by:  As directed    Drink plenty of fluids.  Prune juice may be helpful.  You may use a stool softener, such as Colace (over the counter) 100 mg twice a day.  Use MiraLax (over the counter) for constipation as needed.   Diet - low sodium heart healthy   Complete by:  As directed    Discharge instructions   Complete by:  As directed    Maintain surgical dressing until follow up in the clinic. If the edges start to pull up, may reinforce with tape. If the dressing is no longer working, may remove and cover with gauze and tape, but must keep the area dry and clean.  Follow up in 2 weeks at [facility]. Call with any questions or concerns.   Increase activity slowly as tolerated   Complete by:  As directed    Weight bearing as tolerated with assist device (walker, cane, etc) as directed, use it as long as suggested by your surgeon or therapist, typically at least 4-6 weeks.   TED hose   Complete by:  As directed    Use stockings (TED hose) for 2 weeks on both leg(s).  You may remove them at night for sleeping.      Allergies as of 10/23/2017      Reactions   Morphine And Related Nausea And Vomiting      Medication List    STOP taking these medications   acetaminophen 500 MG tablet Commonly known as:  TYLENOL   aspirin EC 81 MG tablet Replaced by:  aspirin 81 MG chewable tablet   meloxicam 15 MG tablet Commonly known as:  MOBIC     TAKE these medications   ascorbic acid 1000 MG tablet Commonly known as:  VITAMIN C Take 1,000 mg by mouth daily with supper.   aspirin 81 MG chewable tablet Commonly known as:  ASPIRIN CHILDRENS Chew 1 tablet (81 mg total) by mouth 2 (two) times daily. Take for 4 weeks, then resume regular dose. Replaces:  aspirin EC  81 MG tablet   docusate sodium 100 MG capsule Commonly known as:  COLACE Take 1 capsule (100 mg total) by mouth 2 (two) times daily.   ferrous sulfate 325 (65 FE) MG tablet Commonly known as:  FERROUSUL Take 1 tablet (325 mg total) by mouth 3 (three) times daily with meals.   glucosamine-chondroitin 500-400 MG tablet Take 1 tablet by mouth daily with supper.   HYDROcodone-acetaminophen 7.5-325 MG tablet Commonly known as:  NORCO Take 1-2 tablets by mouth every 4 (four) hours as needed for moderate pain.   LUBRICANT EYE DROPS 0.4-0.3 % Soln Generic drug:  Polyethyl Glycol-Propyl Glycol Place 1 drop  into both eyes 3 (three) times daily as needed (for dry/irritated eyes.).   methocarbamol 500 MG tablet Commonly known as:  ROBAXIN Take 1 tablet (500 mg total) by mouth every 6 (six) hours as needed for muscle spasms.   polyethylene glycol packet Commonly known as:  MIRALAX / GLYCOLAX Take 17 g by mouth 2 (two) times daily.   sildenafil 20 MG tablet Commonly known as:  REVATIO Take 2-5 tablets (40-100 mg total) by mouth daily as needed. What changed:    how much to take  reasons to take this   simvastatin 80 MG tablet Commonly known as:  ZOCOR Take 0.5 tablets (40 mg total) at bedtime by mouth.   valsartan-hydrochlorothiazide 80-12.5 MG tablet Commonly known as:  DIOVAN-HCT Take 1 tablet by mouth daily. What changed:  when to take this   Vitamin D3 2000 units Tabs Take 2,000 Units by mouth at bedtime.            Discharge Care Instructions  (From admission, onward)        Start     Ordered   10/23/17 0000  Change dressing    Comments:  Maintain surgical dressing until follow up in the clinic. If the edges start to pull up, may reinforce with tape. If the dressing is no longer working, may remove and cover with gauze and tape, but must keep the area dry and clean.  Call with any questions or concerns.   10/23/17 9390       Signed: West Pugh. Merna Baldi    PA-C  10/24/2017, 11:07 AM

## 2017-11-25 ENCOUNTER — Encounter: Payer: Self-pay | Admitting: Family Medicine

## 2017-11-26 DIAGNOSIS — F4323 Adjustment disorder with mixed anxiety and depressed mood: Secondary | ICD-10-CM | POA: Diagnosis not present

## 2017-12-11 DIAGNOSIS — Z471 Aftercare following joint replacement surgery: Secondary | ICD-10-CM | POA: Diagnosis not present

## 2017-12-11 DIAGNOSIS — Z96642 Presence of left artificial hip joint: Secondary | ICD-10-CM | POA: Diagnosis not present

## 2017-12-12 IMAGING — DX DG CHEST 2V
2 series · 2 of 2 positions shown · non-contrast
Comparison: 11/26/2013

CLINICAL DATA: Cough and chest congestion. Shortness of breath.
Fever.

EXAM:
CHEST  2 VIEW

[chest pa]
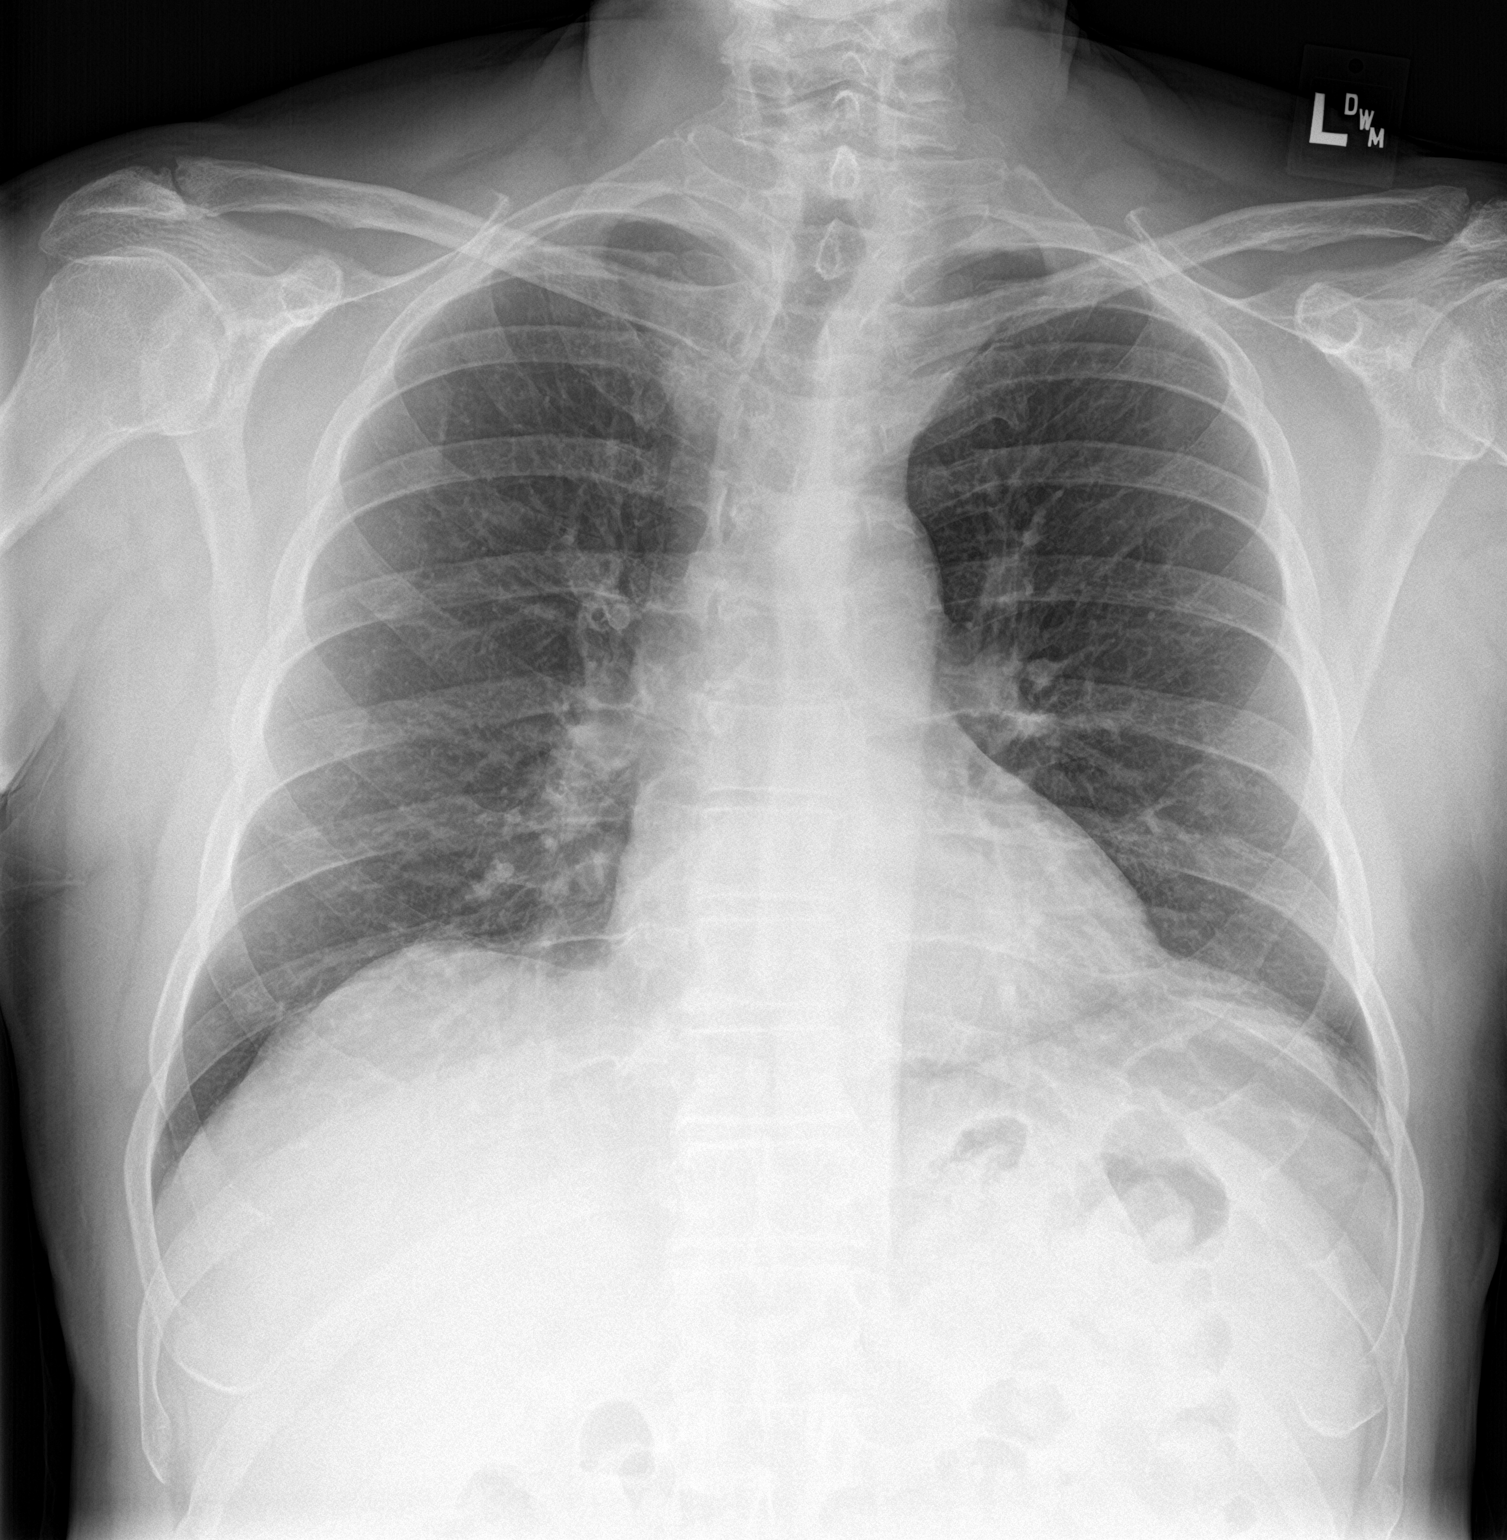

[chest lat]
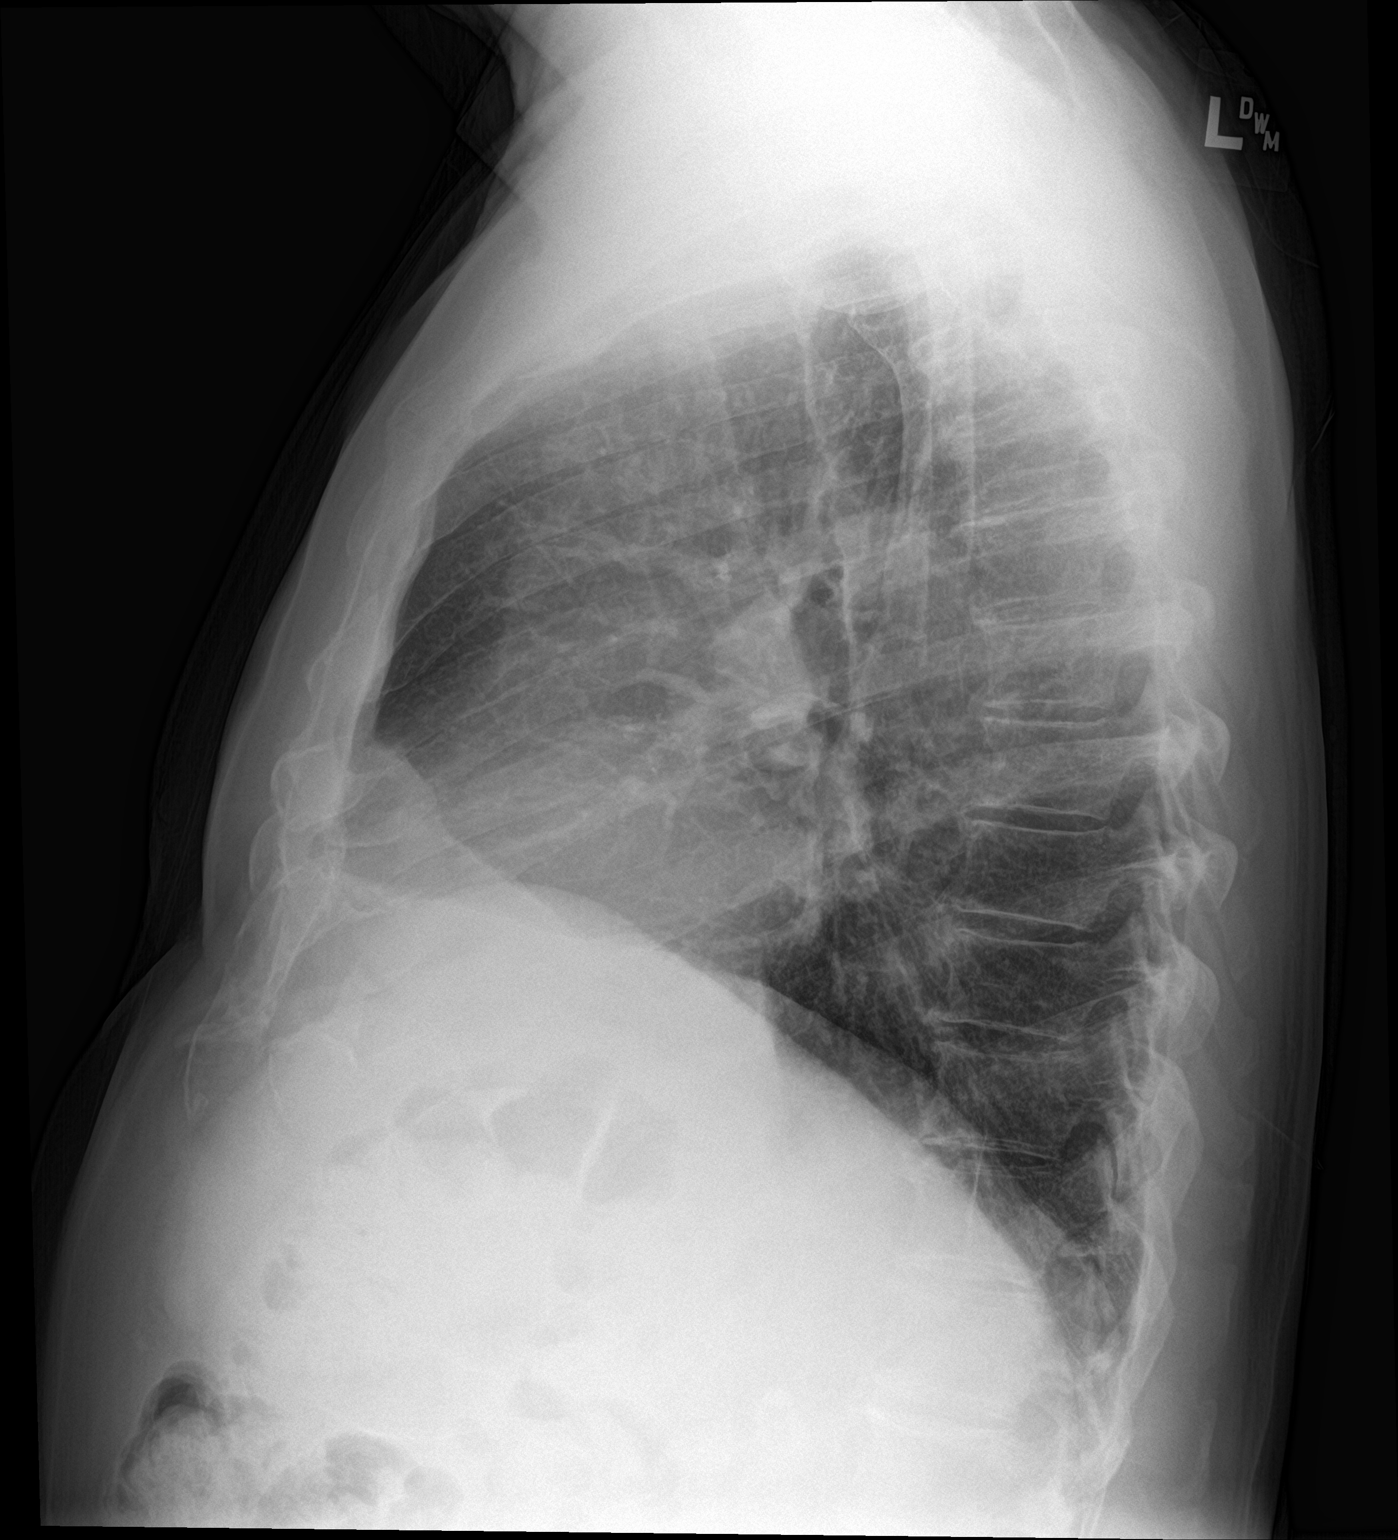

[2 of 2 positions shown; findings below may reference images not displayed]

FINDINGS: Chronic peribronchial thickening. Heart size and pulmonary
vascularity are normal. No infiltrates or effusions. No acute bone
abnormality.
IMPRESSION: Bronchitic changes which could be recurrent or chronic.

## 2017-12-17 DIAGNOSIS — F4323 Adjustment disorder with mixed anxiety and depressed mood: Secondary | ICD-10-CM | POA: Diagnosis not present

## 2017-12-23 ENCOUNTER — Ambulatory Visit (INDEPENDENT_AMBULATORY_CARE_PROVIDER_SITE_OTHER): Payer: Medicare Other | Admitting: Podiatry

## 2017-12-23 ENCOUNTER — Encounter: Payer: Self-pay | Admitting: Podiatry

## 2017-12-23 VITALS — Ht 66.0 in | Wt 175.0 lb

## 2017-12-23 DIAGNOSIS — B351 Tinea unguium: Secondary | ICD-10-CM | POA: Diagnosis not present

## 2017-12-23 DIAGNOSIS — M79672 Pain in left foot: Secondary | ICD-10-CM

## 2017-12-23 DIAGNOSIS — L600 Ingrowing nail: Secondary | ICD-10-CM | POA: Diagnosis not present

## 2017-12-23 DIAGNOSIS — M79671 Pain in right foot: Secondary | ICD-10-CM | POA: Diagnosis not present

## 2017-12-23 NOTE — Progress Notes (Signed)
SUBJECTIVE: 77 y.o. year old male presents complaining of painful nail. They are curbed in and hurts when trim. Having difficulty bending down due to a recent hip surgery. Left hip surgery 10/22/17, right hip surgery 3 years ago.  Review of Systems  Constitutional: Negative.   HENT:       Wears hearing aids.  Eyes: Negative.   Respiratory: Negative.   Cardiovascular: Negative.   Gastrointestinal: Negative.   Genitourinary: Negative.   Musculoskeletal:       History of bilateral hip surgery.  Skin: Negative.      OBJECTIVE: DERMATOLOGIC EXAMINATION: Thick yellow hypertrophic nails x 10. Ingrown left great toe medial border.  VASCULAR EXAMINATION OF LOWER LIMBS: All pedal pulses are faintly palpable. No edema or erythema noted.  Capillary Filling times within 3 seconds in all digits.  Temperature gradient from tibial crest to dorsum of foot is within normal bilateral.  NEUROLOGIC EXAMINATION OF THE LOWER LIMBS: All epicritic and tactile sensations grossly intact. Sharp and Dull discriminatory sensations at the plantar ball of hallux is intact bilateral.   MUSCULOSKELETAL EXAMINATION: Positive for hyperextended great toe at IPJ R>L.   ASSESSMENT: Onychomycosis x 10. Ingrown nail left great toe. Pain in both feet.  PLAN: Reviewed findings and available treatment options. All nails debrided. Bleeding left great toe nail cleansed with Iodine and band aid applied.

## 2017-12-23 NOTE — Patient Instructions (Signed)
Seen for hypertrophic nails. All nails debrided. Return in 3 months or as needed.  

## 2018-01-03 DIAGNOSIS — I10 Essential (primary) hypertension: Secondary | ICD-10-CM | POA: Diagnosis not present

## 2018-01-03 LAB — COMPLETE METABOLIC PANEL WITH GFR
AG RATIO: 1.7 (calc) (ref 1.0–2.5)
ALKALINE PHOSPHATASE (APISO): 58 U/L (ref 40–115)
ALT: 27 U/L (ref 9–46)
AST: 20 U/L (ref 10–35)
Albumin: 4.2 g/dL (ref 3.6–5.1)
BUN: 18 mg/dL (ref 7–25)
CALCIUM: 9.5 mg/dL (ref 8.6–10.3)
CO2: 30 mmol/L (ref 20–32)
CREATININE: 1.06 mg/dL (ref 0.70–1.18)
Chloride: 103 mmol/L (ref 98–110)
GFR, Est African American: 79 mL/min/{1.73_m2} (ref >=60)
GFR, Est Non African American: 68 mL/min/{1.73_m2} (ref >=60)
Globulin: 2.5 g/dL (calc) (ref 1.9–3.7)
Glucose, Bld: 108 mg/dL — ABNORMAL HIGH (ref 65–99)
POTASSIUM: 3.6 mmol/L (ref 3.5–5.3)
SODIUM: 140 mmol/L (ref 135–146)
Total Bilirubin: 0.6 mg/dL (ref 0.2–1.2)
Total Protein: 6.7 g/dL (ref 6.1–8.1)

## 2018-01-03 LAB — LIPID PANEL
CHOLESTEROL: 158 mg/dL (ref <200)
HDL: 36 mg/dL — AB (ref >40)
LDL Cholesterol (Calc): 90 mg/dL (calc)
Non-HDL Cholesterol (Calc): 122 mg/dL (calc) (ref <130)
TRIGLYCERIDES: 230 mg/dL — AB (ref <150)
Total CHOL/HDL Ratio: 4.4 (calc) (ref <5.0)

## 2018-01-05 ENCOUNTER — Other Ambulatory Visit: Payer: Self-pay | Admitting: Family Medicine

## 2018-01-07 DIAGNOSIS — F4323 Adjustment disorder with mixed anxiety and depressed mood: Secondary | ICD-10-CM | POA: Diagnosis not present

## 2018-01-10 ENCOUNTER — Encounter: Payer: Self-pay | Admitting: Family Medicine

## 2018-01-10 NOTE — Telephone Encounter (Signed)
This can probably go to PCP rather than me.

## 2018-01-14 MED ORDER — TADALAFIL 20 MG PO TABS: 10.0000 mg | ORAL_TABLET | ORAL | 11 refills | Status: DC | PRN

## 2018-01-14 NOTE — Addendum Note (Signed)
Addended by: Beatrice Lecher D on: 01/14/2018 07:57 AM   Modules accepted: Orders

## 2018-01-17 MED ORDER — TADALAFIL 20 MG PO TABS: 10.0000 mg | ORAL_TABLET | ORAL | 11 refills | Status: DC | PRN

## 2018-01-17 NOTE — Addendum Note (Signed)
Addended by: Beatrice Lecher D on: 01/17/2018 05:14 PM   Modules accepted: Orders

## 2018-02-04 DIAGNOSIS — F4323 Adjustment disorder with mixed anxiety and depressed mood: Secondary | ICD-10-CM | POA: Diagnosis not present

## 2018-02-05 ENCOUNTER — Other Ambulatory Visit: Payer: Self-pay | Admitting: Family Medicine

## 2018-02-27 ENCOUNTER — Encounter: Payer: Self-pay | Admitting: Family Medicine

## 2018-02-28 ENCOUNTER — Other Ambulatory Visit: Payer: Self-pay | Admitting: Family Medicine

## 2018-03-04 DIAGNOSIS — F4323 Adjustment disorder with mixed anxiety and depressed mood: Secondary | ICD-10-CM | POA: Diagnosis not present

## 2018-03-08 ENCOUNTER — Encounter: Payer: Self-pay | Admitting: Family Medicine

## 2018-03-10 NOTE — Telephone Encounter (Signed)
Is [facility] an option? Where are we sending people with Dr. Caffie Pinto closing?

## 2018-03-13 NOTE — Telephone Encounter (Signed)
We can send to [facility] in [place] or to Dr. Raelyn Ensign who has an office here in [place] or [place]. - CF

## 2018-03-13 NOTE — Telephone Encounter (Signed)
I called and spoke to patient he is going to call Dr. Raelyn Ensign and schedule with them - CF

## 2018-03-16 DIAGNOSIS — Z23 Encounter for immunization: Secondary | ICD-10-CM | POA: Diagnosis not present

## 2018-03-24 ENCOUNTER — Ambulatory Visit: Payer: Medicare Other | Admitting: Podiatry

## 2018-03-25 DIAGNOSIS — F4323 Adjustment disorder with mixed anxiety and depressed mood: Secondary | ICD-10-CM | POA: Diagnosis not present

## 2018-04-01 ENCOUNTER — Ambulatory Visit: Payer: Medicare Other | Admitting: Podiatry

## 2018-04-07 ENCOUNTER — Other Ambulatory Visit: Payer: Self-pay

## 2018-04-07 ENCOUNTER — Ambulatory Visit: Payer: Medicare Other | Admitting: Family Medicine

## 2018-04-09 ENCOUNTER — Encounter: Payer: Self-pay | Admitting: Family Medicine

## 2018-04-09 ENCOUNTER — Ambulatory Visit (INDEPENDENT_AMBULATORY_CARE_PROVIDER_SITE_OTHER): Payer: Medicare Other | Admitting: Family Medicine

## 2018-04-09 VITALS — BP 123/61 | HR 81 | Temp 98.4°F | Ht 66.14 in | Wt 183.0 lb

## 2018-04-09 DIAGNOSIS — I10 Essential (primary) hypertension: Secondary | ICD-10-CM

## 2018-04-09 DIAGNOSIS — J069 Acute upper respiratory infection, unspecified: Secondary | ICD-10-CM

## 2018-04-09 DIAGNOSIS — R7309 Other abnormal glucose: Secondary | ICD-10-CM | POA: Diagnosis not present

## 2018-04-09 LAB — POCT GLYCOSYLATED HEMOGLOBIN (HGB A1C): HEMOGLOBIN A1C: 5.2 % (ref 4.0–5.6)

## 2018-04-09 MED ORDER — FAMOTIDINE 20 MG PO TABS: 20.0000 mg | ORAL_TABLET | Freq: Every day | ORAL | 1 refills | Status: DC

## 2018-04-09 MED ORDER — PANTOPRAZOLE SODIUM 40 MG PO TBEC: 40.0000 mg | DELAYED_RELEASE_TABLET | ORAL | 1 refills | Status: DC

## 2018-04-09 NOTE — Patient Instructions (Signed)
Upper Respiratory Infection, Adult Most upper respiratory infections (URIs) are a viral infection of the air passages leading to the lungs. A URI affects the nose, throat, and upper air passages. The most common type of URI is nasopharyngitis and is typically referred to as "the common cold." URIs run their course and usually go away on their own. Most of the time, a URI does not require medical attention, but sometimes a bacterial infection in the upper airways can follow a viral infection. This is called a secondary infection. Sinus and middle ear infections are common types of secondary upper respiratory infections. Bacterial pneumonia can also complicate a URI. A URI can worsen asthma and chronic obstructive pulmonary disease (COPD). Sometimes, these complications can require emergency medical care and may be life threatening. What are the causes? Almost all URIs are caused by viruses. A virus is a type of germ and can spread from one person to another. What increases the risk? You may be at risk for a URI if:  You smoke.  You have chronic heart or lung disease.  You have a weakened defense (immune) system.  You are very young or very old.  You have nasal allergies or asthma.  You work in crowded or poorly ventilated areas.  You work in health care facilities or schools.  What are the signs or symptoms? Symptoms typically develop 2-3 days after you come in contact with a cold virus. Most viral URIs last 7-10 days. However, viral URIs from the influenza virus (flu virus) can last 14-18 days and are typically more severe. Symptoms may include:  Runny or stuffy (congested) nose.  Sneezing.  Cough.  Sore throat.  Headache.  Fatigue.  Fever.  Loss of appetite.  Pain in your forehead, behind your eyes, and over your cheekbones (sinus pain).  Muscle aches.  How is this diagnosed? Your health care provider may diagnose a URI by:  Physical exam.  Tests to check that your  symptoms are not due to another condition such as: ? Strep throat. ? Sinusitis. ? Pneumonia. ? Asthma.  How is this treated? A URI goes away on its own with time. It cannot be cured with medicines, but medicines may be prescribed or recommended to relieve symptoms. Medicines may help:  Reduce your fever.  Reduce your cough.  Relieve nasal congestion.  Follow these instructions at home:  Take medicines only as directed by your health care provider.  Gargle warm saltwater or take cough drops to comfort your throat as directed by your health care provider.  Use a warm mist humidifier or inhale steam from a shower to increase air moisture. This may make it easier to breathe.  Drink enough fluid to keep your urine clear or pale yellow.  Eat soups and other clear broths and maintain good nutrition.  Rest as needed.  Return to work when your temperature has returned to normal or as your health care provider advises. You may need to stay home longer to avoid infecting others. You can also use a face mask and careful hand washing to prevent spread of the virus.  Increase the usage of your inhaler if you have asthma.  Do not use any tobacco products, including cigarettes, chewing tobacco, or electronic cigarettes. If you need help quitting, ask your health care provider. How is this prevented? The best way to protect yourself from getting a cold is to practice good hygiene.  Avoid oral or hand contact with people with cold symptoms.  Wash your   hands often if contact occurs.  There is no clear evidence that vitamin C, vitamin E, echinacea, or exercise reduces the chance of developing a cold. However, it is always recommended to get plenty of rest, exercise, and practice good nutrition. Contact a health care provider if:  You are getting worse rather than better.  Your symptoms are not controlled by medicine.  You have chills.  You have worsening shortness of breath.  You have  brown or red mucus.  You have yellow or brown nasal discharge.  You have pain in your face, especially when you bend forward.  You have a fever.  You have swollen neck glands.  You have pain while swallowing.  You have white areas in the back of your throat. Get help right away if:  You have severe or persistent: ? Headache. ? Ear pain. ? Sinus pain. ? Chest pain.  You have chronic lung disease and any of the following: ? Wheezing. ? Prolonged cough. ? Coughing up blood. ? A change in your usual mucus.  You have a stiff neck.  You have changes in your: ? Vision. ? Hearing. ? Thinking. ? Mood. This information is not intended to replace advice given to you by your health care provider. Make sure you discuss any questions you have with your health care provider. Document Released: 10/31/2000 Document Revised: 01/08/2016 Document Reviewed: 08/12/2013 Elsevier Interactive Patient Education  2018 [employer] Inc[employer]  

## 2018-04-09 NOTE — Progress Notes (Signed)
Subjective:    CC:   HPI: Hypertension- Pt denies chest pain, SOB, dizziness, or heart palpitations.  Taking meds as directed w/o problems.  Denies medication side effects.    Had a left hip replacement done in June.  Overall he is actually doing really well.  Abnormal glucose - glucose borderline elevated on last fasting labs.    [place] started with runny nose, nasal congestion, ST and fever to 101.  No cough yet, but had had upper airway cough in the past that was treated by Dr. Melvyn Novas.  He was given a prescription of pantoprazole to take in the morning and famotidine to take at night.  He really like to go ahead and start the medication before his starts coughing because the last time this happened it lasted for several months.  He says a sore throat is improved.  He has not had any fevers during the daytime but did have a fever to 100.9 on Monday night and then 100.9 on Tuesday night.  Past medical history, Surgical history, Family history not pertinant except as noted below, Social history, Allergies, and medications have been entered into the medical record, reviewed, and corrections made.   Review of Systems: + fevers, chills. No night sweats, weight loss, chest pain, or shortness of breath.   Objective:    General: Well Developed, well nourished, and in no acute distress.  Neuro: Alert and oriented x3, extra-ocular muscles intact, sensation grossly intact.  HEENT: Normocephalic, atraumatic oropharynx clear, TMs and canals are clear bilaterally.  No significant cervical lymphadenopathy. Skin: Warm and dry, no rashes. Cardiac: Regular rate and rhythm, no murmurs rubs or gallops, no lower extremity edema.  Respiratory: Clear to auscultation bilaterally. Not using accessory muscles, speaking in full sentences.   Impression and Recommendations:    HTN - Well controlled. Continue current regimen. Follow up in  6 months.    Abnormal glucose -  Stable.  A1C of 5.2 today.    URI -viral.  Gave reassurance.  I did ask him to give [place] a call if he has a fever for greater than 3 days.  I did go ahead and prescribe the PPI and H2 blocker as he wants to try to get ahead of the cough.  So if he is not better in a week then please give [place] call back as well.  Or if he notices any increased shortness of breath.

## 2018-04-10 ENCOUNTER — Encounter: Payer: Self-pay | Admitting: Family Medicine

## 2018-04-24 DIAGNOSIS — H903 Sensorineural hearing loss, bilateral: Secondary | ICD-10-CM | POA: Diagnosis not present

## 2018-04-29 DIAGNOSIS — F4323 Adjustment disorder with mixed anxiety and depressed mood: Secondary | ICD-10-CM | POA: Diagnosis not present

## 2018-05-01 ENCOUNTER — Other Ambulatory Visit: Payer: Self-pay | Admitting: Family Medicine

## 2018-05-06 DIAGNOSIS — H903 Sensorineural hearing loss, bilateral: Secondary | ICD-10-CM | POA: Diagnosis not present

## 2018-06-03 ENCOUNTER — Other Ambulatory Visit: Payer: Self-pay | Admitting: Family Medicine

## 2018-06-03 DIAGNOSIS — F4323 Adjustment disorder with mixed anxiety and depressed mood: Secondary | ICD-10-CM | POA: Diagnosis not present

## 2018-06-05 DIAGNOSIS — L570 Actinic keratosis: Secondary | ICD-10-CM | POA: Diagnosis not present

## 2018-06-05 DIAGNOSIS — L821 Other seborrheic keratosis: Secondary | ICD-10-CM | POA: Diagnosis not present

## 2018-06-05 DIAGNOSIS — L820 Inflamed seborrheic keratosis: Secondary | ICD-10-CM | POA: Diagnosis not present

## 2018-06-05 DIAGNOSIS — D2371 Other benign neoplasm of skin of right lower limb, including hip: Secondary | ICD-10-CM | POA: Diagnosis not present

## 2018-06-05 DIAGNOSIS — D1801 Hemangioma of skin and subcutaneous tissue: Secondary | ICD-10-CM | POA: Diagnosis not present

## 2018-06-05 DIAGNOSIS — D225 Melanocytic nevi of trunk: Secondary | ICD-10-CM | POA: Diagnosis not present

## 2018-06-18 ENCOUNTER — Telehealth: Payer: Self-pay

## 2018-06-18 MED ORDER — OSELTAMIVIR PHOSPHATE 75 MG PO CAPS: 75.0000 mg | ORAL_CAPSULE | Freq: Every day | ORAL | 0 refills | Status: DC

## 2018-06-18 NOTE — Telephone Encounter (Signed)
Left message advising of prescription.

## 2018-06-18 NOTE — Telephone Encounter (Signed)
Scription sent. 

## 2018-06-18 NOTE — Telephone Encounter (Signed)
[name] states his is taking care of his grandson and he was diagnosed with the Flu B. He reports no symptoms. He would like to get Tamiflu. [facility]  CMP     Component Value Date/Time   NA 140 01/03/2018 0834   NA 141 05/06/2016   K 3.6 01/03/2018 0834   CL 103 01/03/2018 0834   CO2 30 01/03/2018 0834   GLUCOSE 108 (H) 01/03/2018 0834   BUN 18 01/03/2018 0834   BUN 28 (A) 05/06/2016   CREATININE 1.06 01/03/2018 0834   CALCIUM 9.5 01/03/2018 0834   PROT 6.7 01/03/2018 0834   ALBUMIN 4.2 10/05/2013 0855   AST 20 01/03/2018 0834   ALT 27 01/03/2018 0834   ALKPHOS 64 05/06/2016   BILITOT 0.6 01/03/2018 0834   GFRNONAA 68 01/03/2018 0834   GFRAA 79 01/03/2018 0834

## 2018-07-01 DIAGNOSIS — F4323 Adjustment disorder with mixed anxiety and depressed mood: Secondary | ICD-10-CM | POA: Diagnosis not present

## 2018-07-29 DIAGNOSIS — F4323 Adjustment disorder with mixed anxiety and depressed mood: Secondary | ICD-10-CM | POA: Diagnosis not present

## 2018-07-30 NOTE — Progress Notes (Deleted)
Subjective:   [name] is a 78 y.o. male who presents for Medicare Annual (Subsequent) preventive examination.  Review of Systems:  No ROS.  Medicare Wellness Visit. Additional risk factors are reflected in the social history.     Sleep patterns:    Home Safety/Smoke Alarms: Feels safe in home. Smoke alarms in place.  Living environment; Seat Belt Safety/Bike Helmet: Wears seat belt.    Male:   CCS- utd    PSA-  Lab Results  Component Value Date   PSA 1.5 12/14/2016   PSA 1.53 05/02/2011   PSA 1.57 04/26/2010        Objective:     Vitals: There were no vitals taken for this visit.  There is no height or weight on file to calculate BMI.  Advanced Directives 10/23/2017 10/22/2017 10/16/2017 12/14/2016 11/15/2015 11/15/2015 11/07/2015  Does Patient Have a Medical Advance Directive? Yes Yes Yes Yes Yes Yes Yes  Type of Paramedic of [place];Living will [place];Living will [place];Living will - Living will Living will Living will;Healthcare Power of Attorney  Does patient want to make changes to medical advance directive? No - Patient declined No - Patient declined No - Patient declined No - Patient declined No - Patient declined - No - Patient declined  Copy of [place] in Chart? No - copy requested No - copy requested No - copy requested - No - copy requested Yes No - copy requested    Tobacco Social History   Tobacco Use  Smoking Status Never Smoker  Smokeless Tobacco Never Used     Counseling given: Not Answered   Clinical Intake:                       Past Medical History:  Diagnosis Date  . Arthritis    osteoarthritis right hip  . Carotid artery plaque    mild  . Colon polyps   . Erectile dysfunction   . GERD (gastroesophageal reflux disease)    history- no current problems  . History of kidney stones   . History of nephrolithiasis    episodes x 6    . HLD (hyperlipidemia)   . HTN (hypertension)   . Impaired hearing    bilateral hearing aids  . Sleep apnea    cpap used with nasal clip   Past Surgical History:  Procedure Laterality Date  . cataract Bilateral 2015  . CYSTOSCOPY W/ STONE MANIPULATION     x 2  . HEMORRHOID SURGERY    . INGUINAL HERNIA REPAIR Right   . LAPAROSCOPIC DRAINAGE RENAL CYST    . LITHOTRIPSY     x 1   . TOTAL HIP ARTHROPLASTY Right 11/15/2015   Procedure: RIGHT TOTAL HIP ARTHROPLASTY ANTERIOR APPROACH;  Surgeon: Paralee Cancel, MD;  Location: [facility];  Service: Orthopedics;  Laterality: Right;  . TOTAL HIP ARTHROPLASTY Left 10/22/2017   Procedure: LEFT TOTAL HIP ARTHROPLASTY ANTERIOR APPROACH;  Surgeon: Paralee Cancel, MD;  Location: [facility];  Service: Orthopedics;  Laterality: Left;   Family History  Problem Relation Age of Onset  . Colon cancer Father   . Heart attack Mother 65  . Diabetes Unknown        1st degree relative   Social History   Socioeconomic History  . Marital status: Married    Spouse name: [name]   . Number of children: Not on file  . Years of education:  Not on file  . Highest education level: Not on file  Occupational History  . Occupation: Retired   Scientific laboratory technician  . Financial resource strain: Not on file  . Food insecurity:    Worry: Not on file    Inability: Not on file  . Transportation needs:    Medical: Not on file    Non-medical: Not on file  Tobacco Use  . Smoking status: Never Smoker  . Smokeless tobacco: Never Used  Substance and Sexual Activity  . Alcohol use: No  . Drug use: No  . Sexual activity: Yes  Lifestyle  . Physical activity:    Days per week: Not on file    Minutes per session: Not on file  . Stress: Not on file  Relationships  . Social connections:    Talks on phone: Not on file    Gets together: Not on file    Attends religious service: Not on file    Active member of club or organization: Not on file    Attends meetings of clubs or  organizations: Not on file    Relationship status: Not on file  Other Topics Concern  . Not on file  Social History Narrative   Married.  No regular exercise.  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 He now teaches pottery in [place]    Outpatient Encounter Medications as of 08/06/2018  Medication Sig  . ascorbic acid (VITAMIN C) 1000 MG tablet Take 1,000 mg by mouth daily with supper.   . Cholecalciferol (VITAMIN D3) 2000 units TABS Take 2,000 Units by mouth at bedtime.   . famotidine (PEPCID) 20 MG tablet TAKE 1 TABLET BY MOUTH EVERYDAY AT BEDTIME  . glucosamine-chondroitin 500-400 MG tablet Take 1 tablet by mouth daily with supper.   . methocarbamol (ROBAXIN) 500 MG tablet Take 1 tablet (500 mg total) by mouth every 6 (six) hours as needed for muscle spasms.  Marland Kitchen oseltamivir (TAMIFLU) 75 MG capsule Take 1 capsule (75 mg total) by mouth daily.  . pantoprazole (PROTONIX) 40 MG tablet TAKE 1 TABLET (40 MG TOTAL) BY MOUTH EVERY MORNING. 30 MIN BEFORE BREAKFAST  . simvastatin (ZOCOR) 80 MG tablet TAKE 0.5 TABLETS (40 MG TOTAL) AT BEDTIME BY MOUTH.  . valsartan-hydrochlorothiazide (DIOVAN-HCT) 80-12.5 MG tablet TAKE 1 TABLET BY MOUTH EVERY DAY   No facility-administered encounter medications on file as of 08/06/2018.     Activities of Daily Living In your present state of health, do you have any difficulty performing the following activities: 10/22/2017 10/22/2017  Hearing? - Y  Vision? - N  Difficulty concentrating or making decisions? - N  Walking or climbing stairs? - Y  Dressing or bathing? - N  Doing errands, shopping? N -  Some recent data might be hidden    Patient Care Team: Hali Marry, MD as PCP - General (Family Medicine)    Assessment:   This is a routine wellness examination for [name].Physical assessment deferred to  PCP.   Exercise Activities and Dietary recommendations   Diet Breakfast: Lunch:  Dinner:       Goals    . Cut out extra servings       Fall Risk Fall Risk  04/09/2018 04/07/2018 04/17/2017 01/17/2016 11/12/2012  Falls in the past year? 0 0 No No No  Comment - Emmi Telephone Survey: data to  providers prior to load - Emmi Telephone Survey: data to providers prior to load -  Number falls in past yr: 0 - - - -   Is the patient's home free of loose throw rugs in walkways, pet beds, electrical cords, etc?   {Blank single:19197::"yes","no"}      Grab bars in the bathroom? {Blank single:19197::"yes","no"}      Handrails on the stairs?   {Blank single:19197::"yes","no"}      Adequate lighting?   {Blank single:19197::"yes","no"}   Depression Screen PHQ 2/9 Scores 04/09/2018 04/17/2017 12/14/2016 11/12/2012  PHQ - 2 Score 0 0 0 0     Cognitive Function        Immunization History  Administered Date(s) Administered  . Influenza Split 05/02/2011  . Influenza Whole 03/07/2009, 04/26/2010  . Influenza, High Dose Seasonal PF 03/08/2017, 03/15/2018  . Influenza,inj,Quad PF,6+ Mos 05/09/2015  . Influenza-Unspecified 02/18/2014  . PPD Test 04/25/2015  . Pneumococcal Conjugate-13 02/12/2007, 08/23/2014  . Pneumococcal Polysaccharide-23 03/07/2009  . Td 07/16/2003  . Tdap 10/05/2013  . Zoster 01/06/2008    Screening Tests Health Maintenance  Topic Date Due  . TETANUS/TDAP  10/06/2023  . INFLUENZA VACCINE  Completed  . PNA vac Low Risk Adult  Completed        Plan:   ***   I have personally reviewed and noted the following in the patient's chart:   . Medical and social history . Use of alcohol, tobacco or illicit drugs  . Current medications and supplements . Functional ability and status . Nutritional status . Physical activity . Advanced directives . List of other physicians . Hospitalizations, surgeries, and ER visits in previous 12 months . Vitals . Screenings to  include cognitive, depression, and falls . Referrals and appointments  In addition, I have reviewed and discussed with patient certain preventive protocols, quality metrics, and best practice recommendations. A written personalized care plan for preventive services as well as general preventive health recommendations were provided to patient.     Joanne Chars, LPN  9/56/[id number]

## 2018-07-31 DIAGNOSIS — H52203 Unspecified astigmatism, bilateral: Secondary | ICD-10-CM | POA: Diagnosis not present

## 2018-07-31 DIAGNOSIS — D3132 Benign neoplasm of left choroid: Secondary | ICD-10-CM | POA: Diagnosis not present

## 2018-07-31 DIAGNOSIS — Z961 Presence of intraocular lens: Secondary | ICD-10-CM | POA: Diagnosis not present

## 2018-07-31 DIAGNOSIS — H18603 Keratoconus, unspecified, bilateral: Secondary | ICD-10-CM | POA: Diagnosis not present

## 2018-08-06 ENCOUNTER — Ambulatory Visit: Payer: Medicare Other

## 2018-08-10 ENCOUNTER — Other Ambulatory Visit: Payer: Self-pay | Admitting: Family Medicine

## 2018-08-13 ENCOUNTER — Ambulatory Visit: Payer: Medicare Other

## 2018-10-08 ENCOUNTER — Ambulatory Visit: Payer: Medicare Other | Admitting: Family Medicine

## 2018-11-05 DIAGNOSIS — Z96641 Presence of right artificial hip joint: Secondary | ICD-10-CM | POA: Diagnosis not present

## 2018-11-05 DIAGNOSIS — M25562 Pain in left knee: Secondary | ICD-10-CM | POA: Diagnosis not present

## 2018-11-05 DIAGNOSIS — Z471 Aftercare following joint replacement surgery: Secondary | ICD-10-CM | POA: Diagnosis not present

## 2018-11-05 DIAGNOSIS — Z96642 Presence of left artificial hip joint: Secondary | ICD-10-CM | POA: Diagnosis not present

## 2018-11-05 DIAGNOSIS — Z96643 Presence of artificial hip joint, bilateral: Secondary | ICD-10-CM | POA: Diagnosis not present

## 2018-11-11 ENCOUNTER — Other Ambulatory Visit: Payer: Self-pay | Admitting: Family Medicine

## 2018-12-10 ENCOUNTER — Other Ambulatory Visit: Payer: Self-pay | Admitting: Family Medicine

## 2019-01-22 ENCOUNTER — Encounter: Payer: Self-pay | Admitting: Family Medicine

## 2019-02-05 ENCOUNTER — Other Ambulatory Visit: Payer: Self-pay | Admitting: Family Medicine

## 2019-02-19 ENCOUNTER — Other Ambulatory Visit: Payer: Self-pay | Admitting: Family Medicine

## 2019-03-01 ENCOUNTER — Other Ambulatory Visit: Payer: Self-pay | Admitting: Family Medicine

## 2019-03-10 ENCOUNTER — Other Ambulatory Visit: Payer: Self-pay | Admitting: Family Medicine

## 2019-03-18 ENCOUNTER — Ambulatory Visit: Payer: Medicare Other

## 2019-03-19 ENCOUNTER — Encounter: Payer: Self-pay | Admitting: Family Medicine

## 2019-03-20 ENCOUNTER — Ambulatory Visit (INDEPENDENT_AMBULATORY_CARE_PROVIDER_SITE_OTHER): Payer: Medicare Other | Admitting: Family Medicine

## 2019-03-20 ENCOUNTER — Encounter: Payer: Self-pay | Admitting: Family Medicine

## 2019-03-20 ENCOUNTER — Other Ambulatory Visit: Payer: Self-pay

## 2019-03-20 VITALS — BP 113/61 | HR 64 | Ht 66.0 in | Wt 177.0 lb

## 2019-03-20 DIAGNOSIS — L720 Epidermal cyst: Secondary | ICD-10-CM | POA: Diagnosis not present

## 2019-03-20 DIAGNOSIS — I10 Essential (primary) hypertension: Secondary | ICD-10-CM

## 2019-03-20 DIAGNOSIS — K2100 Gastro-esophageal reflux disease with esophagitis, without bleeding: Secondary | ICD-10-CM | POA: Diagnosis not present

## 2019-03-20 DIAGNOSIS — Z13220 Encounter for screening for lipoid disorders: Secondary | ICD-10-CM | POA: Diagnosis not present

## 2019-03-20 DIAGNOSIS — R7309 Other abnormal glucose: Secondary | ICD-10-CM

## 2019-03-20 DIAGNOSIS — Z23 Encounter for immunization: Secondary | ICD-10-CM | POA: Diagnosis not present

## 2019-03-20 DIAGNOSIS — N529 Male erectile dysfunction, unspecified: Secondary | ICD-10-CM

## 2019-03-20 MED ORDER — FAMOTIDINE 20 MG PO TABS: 20.0000 mg | ORAL_TABLET | Freq: Every day | ORAL | 3 refills | Status: DC

## 2019-03-20 MED ORDER — SIMVASTATIN 80 MG PO TABS: 40.0000 mg | ORAL_TABLET | Freq: Every day | ORAL | 3 refills | Status: DC

## 2019-03-20 MED ORDER — VALSARTAN-HYDROCHLOROTHIAZIDE 80-12.5 MG PO TABS: 1.0000 | ORAL_TABLET | Freq: Every day | ORAL | 1 refills | Status: DC

## 2019-03-20 NOTE — Progress Notes (Signed)
Established Patient Office Visit  Subjective:  Patient ID: [name], male    DOB: 13-Oct-1940  Age: 78 y.o. MRN: [id number]  CC:  Chief Complaint  Patient presents with  . Hypertension    HPI [name] presents for   Hypertension- Pt denies chest pain, SOB, dizziness, or heart palpitations.  Taking meds as directed w/o problems.  Denies medication side effects.    F/U GERD -is actually doing really well.  He has been able to just take the famotidine in the evenings and that actually seems to be working great for him.  Hyperlipidemia - tolerating stating well with no myalgias or significant side effects.  Lab Results  Component Value Date   CHOL 158 01/03/2018   CHOL 196 05/06/2016   CHOL 144 10/05/2013   Lab Results  Component Value Date   HDL 36 (L) 01/03/2018   HDL 60 05/06/2016   HDL 37.30 (L) 10/05/2013   Lab Results  Component Value Date   LDLCALC 90 01/03/2018   LDLCALC 112 05/06/2016   LDLCALC 73 10/05/2013   Lab Results  Component Value Date   TRIG 230 (H) 01/03/2018   TRIG 118 05/06/2016   TRIG 171.0 (H) 10/05/2013   Lab Results  Component Value Date   CHOLHDL 4.4 01/03/2018   CHOLHDL 4 10/05/2013   CHOLHDL 4 11/12/2012   Lab Results  Component Value Date   LDLDIRECT 106.9 03/07/2009    He also has a history of erectile dysfunction and would like to have his testosterone checked.  He does get his eyes checked yearly with Dr. Kathrin Penner and follows with Dr. Elwyn Reach with ENT.  He did get new hearing aids this year but does not feel like they work very well.   Past Medical History:  Diagnosis Date  . Arthritis    osteoarthritis right hip  . Carotid artery plaque    mild  . Colon polyps   . Erectile dysfunction   . GERD (gastroesophageal reflux disease)    history- no current problems  . History of kidney stones   . History of nephrolithiasis    episodes x 6   . HLD (hyperlipidemia)   . HTN (hypertension)   . Impaired  hearing    bilateral hearing aids  . Sleep apnea    cpap used with nasal clip    Past Surgical History:  Procedure Laterality Date  . cataract Bilateral 2015  . CYSTOSCOPY W/ STONE MANIPULATION     x 2  . HEMORRHOID SURGERY    . INGUINAL HERNIA REPAIR Right   . LAPAROSCOPIC DRAINAGE RENAL CYST    . LITHOTRIPSY     x 1   . TOTAL HIP ARTHROPLASTY Right 11/15/2015   Procedure: RIGHT TOTAL HIP ARTHROPLASTY ANTERIOR APPROACH;  Surgeon: Paralee Cancel, MD;  Location: [facility];  Service: Orthopedics;  Laterality: Right;  . TOTAL HIP ARTHROPLASTY Left 10/22/2017   Procedure: LEFT TOTAL HIP ARTHROPLASTY ANTERIOR APPROACH;  Surgeon: Paralee Cancel, MD;  Location: [facility];  Service: Orthopedics;  Laterality: Left;    Family History  Problem Relation Age of Onset  . Colon cancer Father   . Heart attack Mother 2  . Diabetes Unknown        1st degree relative    Social History   Socioeconomic History  . Marital status: Married    Spouse name: [name]   . Number of children: Not on file  . Years of education: Not on file  . Highest  education level: Not on file  Occupational History  . Occupation: Retired   Scientific laboratory technician  . Financial resource strain: Not on file  . Food insecurity    Worry: Not on file    Inability: Not on file  . Transportation needs    Medical: Not on file    Non-medical: Not on file  Tobacco Use  . Smoking status: Never Smoker  . Smokeless tobacco: Never Used  Substance and Sexual Activity  . Alcohol use: No  . Drug use: No  . Sexual activity: Yes  Lifestyle  . Physical activity    Days per week: Not on file    Minutes per session: Not on file  . Stress: Not on file  Relationships  . Social Herbalist on phone: Not on file    Gets together: Not on file    Attends religious service: Not on file    Active member of club or organization: Not on file    Attends meetings of clubs or organizations: Not on file    Relationship status: Not on file  .  Intimate partner violence    Fear of current or ex partner: Not on file    Emotionally abused: Not on file    Physically abused: Not on file    Forced sexual activity: Not on file  Other Topics Concern  . Not on file  Social History Narrative   Married.  No regular exercise.  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 ascorbic acid (VITAMIN C) 1000 MG tablet Take 1,000 mg by mouth daily with supper.     . Cholecalciferol (VITAMIN D3) 2000 units TABS Take 2,000 Units by mouth at bedtime.     Marland Kitchen glucosamine-chondroitin 500-400 MG tablet Take 1 tablet by mouth daily with supper.     . famotidine (PEPCID) 20 MG tablet Take 1 tablet (20 mg total) by mouth daily. Needs appt for refills 15 tablet 0  . pantoprazole (PROTONIX) 40 MG tablet TAKE 1 TABLET (40 MG TOTAL) BY MOUTH EVERY MORNING. 30 MIN BEFORE BREAKFAST 30 tablet 1  . simvastatin (ZOCOR) 80 MG tablet Take 0.5 tablets (40 mg total) by mouth at bedtime. NEEDS LABS 15 tablet 0  . valsartan-hydrochlorothiazide (DIOVAN-HCT) 80-12.5 MG tablet Take 1 tablet by mouth daily. Needs appt for refills 15 tablet 0  . methocarbamol (ROBAXIN) 500 MG tablet Take 1 tablet (500 mg total) by mouth every 6 (six) hours as needed for muscle spasms. 40 tablet 0  . oseltamivir (TAMIFLU) 75 MG capsule Take 1 capsule (75 mg total) by mouth daily. 10 capsule 0   No facility-administered medications prior to visit.     Allergies  Allergen Reactions  . Morphine And Related Nausea And Vomiting  . Cialis [Tadalafil] Other (See Comments)    Limb pain    ROS Review of Systems    Objective:    Physical Exam  Constitutional: He is oriented to person, place, and time. He appears well-developed and  well-nourished.  HENT:  Head: Normocephalic and atraumatic.  Cardiovascular: Normal rate, regular rhythm and normal heart sounds.  No carotid bruits.  Pulmonary/Chest: Effort normal and breath sounds normal.  Neurological: He is alert and oriented to person,  place, and time.  Skin: Skin is warm and dry.  Does have an epidermal cyst behind his right ear.  Psychiatric: He has a normal mood and affect. His behavior is normal.    BP 113/61   Pulse 64   Ht 5\' 6"  (1.676 m)   Wt 177 lb (80.3 kg)   SpO2 99%   BMI 28.57 kg/m  Wt Readings from Last 3 Encounters:  03/20/19 177 lb (80.3 kg)  04/09/18 183 lb (83 kg)  12/23/17 175 lb (79.4 kg)     There are no preventive care reminders to display for this patient.  There are no preventive care reminders to display for this patient.  Lab Results  Component Value Date   TSH 0.92 04/17/2017   Lab Results  Component Value Date   WBC 13.4 (H) 10/23/2017   HGB 13.1 10/23/2017   HCT 37.2 (L) 10/23/2017   MCV 90.1 10/23/2017   PLT 164 10/23/2017   Lab Results  Component Value Date   NA 140 01/03/2018   K 3.6 01/03/2018   CO2 30 01/03/2018   GLUCOSE 108 (H) 01/03/2018   BUN 18 01/03/2018   CREATININE 1.06 01/03/2018   BILITOT 0.6 01/03/2018   ALKPHOS 64 05/06/2016   AST 20 01/03/2018   ALT 27 01/03/2018   PROT 6.7 01/03/2018   ALBUMIN 4.2 10/05/2013   CALCIUM 9.5 01/03/2018   ANIONGAP 11 10/23/2017   GFR 78.93 10/05/2013   Lab Results  Component Value Date   CHOL 158 01/03/2018   Lab Results  Component Value Date   HDL 36 (L) 01/03/2018   Lab Results  Component Value Date   LDLCALC 90 01/03/2018   Lab Results  Component Value Date   TRIG 230 (H) 01/03/2018   Lab Results  Component Value Date   CHOLHDL 4.4 01/03/2018   Lab Results  Component Value Date   HGBA1C 5.2 04/09/2018      Assessment & Plan:   Problem List Items Addressed This Visit      Cardiovascular and Mediastinum   Essential hypertension  - Primary    Well controlled. Continue current regimen. Follow up in  6 months. Due for labs      Relevant Medications   valsartan-hydrochlorothiazide (DIOVAN-HCT) 80-12.5 MG tablet   simvastatin (ZOCOR) 80 MG tablet   Other Relevant Orders   Lipid Panel w/reflex Direct LDL   COMPLETE METABOLIC PANEL WITH GFR   HgB A1c     Digestive   Gastroesophageal reflux disease with esophagitis without hemorrhage    Actually doing well just on the H2 blocker at night.  Refill sent to pharmacy.  We will discontinue the pantoprazole we can always add it back if needed.      Relevant Medications   famotidine (PEPCID) 20 MG tablet     Other   ED (erectile dysfunction)    He would like to have testosterone levels checked.      Relevant Orders   Testosterone Total,Free,Bio, Males    Other Visit Diagnoses    Need for immunization against influenza       Relevant Orders   Flu Vaccine QUAD High Dose(Fluad) (Completed)   Abnormal glucose       Relevant Orders   Lipid Panel w/reflex Direct LDL   COMPLETE METABOLIC PANEL WITH GFR   HgB A1c   Screening, lipid       Relevant Orders   Lipid Panel w/reflex Direct LDL   COMPLETE METABOLIC PANEL WITH GFR  HgB A1c   Epidermal cyst          Epidermal cyst-discussed benign nature of lesion.  Only recommend removal if it is getting larger or becomes irritated painful or infected.  Meds ordered this encounter  Medications  . valsartan-hydrochlorothiazide (DIOVAN-HCT) 80-12.5 MG tablet    Sig: Take 1 tablet by mouth daily.    Dispense:  90 tablet    Refill:  1  . simvastatin (ZOCOR) 80 MG tablet    Sig: Take 0.5 tablets (40 mg total) by mouth at bedtime.    Dispense:  90 tablet    Refill:  3  . famotidine (PEPCID) 20 MG tablet    Sig: Take 1 tablet (20 mg total) by mouth at bedtime.    Dispense:  90 tablet    Refill:  3    Follow-up: Return in about 1 year (around 03/19/2020) for Hypertension.    Beatrice Lecher, MD

## 2019-03-20 NOTE — Assessment & Plan Note (Signed)
Well controlled. Continue current regimen. Follow up in  6 months. Due for labs.   

## 2019-03-20 NOTE — Assessment & Plan Note (Signed)
Actually doing well just on the H2 blocker at night.  Refill sent to pharmacy.  We will discontinue the pantoprazole we can always add it back if needed.

## 2019-03-20 NOTE — Assessment & Plan Note (Signed)
He would like to have testosterone levels checked.

## 2019-03-23 DIAGNOSIS — R7309 Other abnormal glucose: Secondary | ICD-10-CM | POA: Diagnosis not present

## 2019-03-23 DIAGNOSIS — N529 Male erectile dysfunction, unspecified: Secondary | ICD-10-CM | POA: Diagnosis not present

## 2019-03-23 DIAGNOSIS — I10 Essential (primary) hypertension: Secondary | ICD-10-CM | POA: Diagnosis not present

## 2019-03-23 DIAGNOSIS — Z13220 Encounter for screening for lipoid disorders: Secondary | ICD-10-CM | POA: Diagnosis not present

## 2019-03-24 ENCOUNTER — Encounter: Payer: Self-pay | Admitting: Family Medicine

## 2019-03-24 LAB — COMPLETE METABOLIC PANEL WITH GFR
AG Ratio: 1.7 (calc) (ref 1.0–2.5)
ALT: 22 U/L (ref 9–46)
AST: 21 U/L (ref 10–35)
Albumin: 4.2 g/dL (ref 3.6–5.1)
Alkaline phosphatase (APISO): 49 U/L (ref 35–144)
BUN: 17 mg/dL (ref 7–25)
CO2: 31 mmol/L (ref 20–32)
Calcium: 9.6 mg/dL (ref 8.6–10.3)
Chloride: 100 mmol/L (ref 98–110)
Creat: 1.07 mg/dL (ref 0.70–1.18)
GFR, Est African American: 77 mL/min/{1.73_m2} (ref >=60)
GFR, Est Non African American: 67 mL/min/{1.73_m2} (ref >=60)
Globulin: 2.5 g/dL (calc) (ref 1.9–3.7)
Glucose, Bld: 101 mg/dL — ABNORMAL HIGH (ref 65–99)
Potassium: 3.5 mmol/L (ref 3.5–5.3)
Sodium: 140 mmol/L (ref 135–146)
Total Bilirubin: 0.6 mg/dL (ref 0.2–1.2)
Total Protein: 6.7 g/dL (ref 6.1–8.1)

## 2019-03-24 LAB — TESTOSTERONE TOTAL,FREE,BIO, MALES
Albumin: 4.2 g/dL (ref 3.6–5.1)
Sex Hormone Binding: 19 nmol/L — ABNORMAL LOW (ref 22–77)
Testosterone, Bioavailable: 104.9 ng/dL (ref 15.0–150.0)
Testosterone, Free: 54.5 pg/mL (ref 6.0–73.0)
Testosterone: 279 ng/dL (ref 250–827)

## 2019-03-24 LAB — HEMOGLOBIN A1C
Hgb A1c MFr Bld: 5 % of total Hgb (ref <5.7)
Mean Plasma Glucose: 97 (calc)
eAG (mmol/L): 5.4 (calc)

## 2019-03-24 LAB — LIPID PANEL W/REFLEX DIRECT LDL
Cholesterol: 140 mg/dL (ref <200)
HDL: 37 mg/dL — ABNORMAL LOW (ref >=40)
LDL Cholesterol (Calc): 81 mg/dL (calc)
Non-HDL Cholesterol (Calc): 103 mg/dL (calc) (ref <130)
Total CHOL/HDL Ratio: 3.8 (calc) (ref <5.0)
Triglycerides: 128 mg/dL (ref <150)

## 2019-03-26 ENCOUNTER — Encounter: Payer: Self-pay | Admitting: Family Medicine

## 2019-03-27 ENCOUNTER — Encounter: Payer: Self-pay | Admitting: Family Medicine

## 2019-03-27 DIAGNOSIS — N529 Male erectile dysfunction, unspecified: Secondary | ICD-10-CM

## 2019-03-27 NOTE — Telephone Encounter (Signed)
Wanting referral for urology to discuss ED.   Referral pended for [facility]

## 2019-03-30 ENCOUNTER — Encounter: Payer: Self-pay | Admitting: Family Medicine

## 2019-03-30 NOTE — Telephone Encounter (Signed)
I really like Dr. Eda Keys there.

## 2019-03-31 NOTE — Telephone Encounter (Signed)
I printed most recent labs and faxed them to Dr. Simone Curia office so they will have the information before his appointment - CF

## 2019-04-01 NOTE — Telephone Encounter (Signed)
Dr Madilyn Fireman   I sent patient a message and let him know if had any further questions or needed any further information sent to call me. - CF

## 2019-04-14 DIAGNOSIS — N5201 Erectile dysfunction due to arterial insufficiency: Secondary | ICD-10-CM | POA: Diagnosis not present

## 2019-04-14 DIAGNOSIS — Z125 Encounter for screening for malignant neoplasm of prostate: Secondary | ICD-10-CM | POA: Diagnosis not present

## 2019-04-14 DIAGNOSIS — R972 Elevated prostate specific antigen [PSA]: Secondary | ICD-10-CM | POA: Diagnosis not present

## 2019-04-16 ENCOUNTER — Encounter: Payer: Self-pay | Admitting: Family Medicine

## 2019-04-26 DIAGNOSIS — Z800 Family history of malignant neoplasm of digestive organs: Secondary | ICD-10-CM | POA: Insufficient documentation

## 2019-04-26 DIAGNOSIS — K5790 Diverticulosis of intestine, part unspecified, without perforation or abscess without bleeding: Secondary | ICD-10-CM | POA: Insufficient documentation

## 2019-04-27 DIAGNOSIS — Z86010 Personal history of colonic polyps: Secondary | ICD-10-CM | POA: Diagnosis not present

## 2019-04-27 DIAGNOSIS — K5730 Diverticulosis of large intestine without perforation or abscess without bleeding: Secondary | ICD-10-CM | POA: Diagnosis not present

## 2019-04-27 DIAGNOSIS — K5140 Inflammatory polyps of colon without complications: Secondary | ICD-10-CM | POA: Diagnosis not present

## 2019-04-27 DIAGNOSIS — D125 Benign neoplasm of sigmoid colon: Secondary | ICD-10-CM | POA: Diagnosis not present

## 2019-04-27 DIAGNOSIS — Z800 Family history of malignant neoplasm of digestive organs: Secondary | ICD-10-CM | POA: Diagnosis not present

## 2019-04-27 DIAGNOSIS — D123 Benign neoplasm of transverse colon: Secondary | ICD-10-CM | POA: Diagnosis not present

## 2019-04-27 DIAGNOSIS — K635 Polyp of colon: Secondary | ICD-10-CM | POA: Diagnosis not present

## 2019-04-27 DIAGNOSIS — Z1211 Encounter for screening for malignant neoplasm of colon: Secondary | ICD-10-CM | POA: Diagnosis not present

## 2019-04-27 LAB — HM COLONOSCOPY

## 2019-04-30 ENCOUNTER — Encounter: Payer: Self-pay | Admitting: Family Medicine

## 2019-05-18 DIAGNOSIS — Z20828 Contact with and (suspected) exposure to other viral communicable diseases: Secondary | ICD-10-CM | POA: Diagnosis not present

## 2019-05-28 ENCOUNTER — Encounter: Payer: Self-pay | Admitting: Family Medicine

## 2019-06-19 ENCOUNTER — Ambulatory Visit: Payer: Medicare Other

## 2019-06-26 ENCOUNTER — Ambulatory Visit: Payer: Medicare Other | Attending: Internal Medicine

## 2019-06-26 DIAGNOSIS — Z23 Encounter for immunization: Secondary | ICD-10-CM | POA: Insufficient documentation

## 2019-06-26 NOTE — Progress Notes (Signed)
   Covid-19 Vaccination Clinic  Name:  [name]    MRN: [id number] DOB: [birth year] 06/26/2019  [name] was observed post Covid-19 immunization for 15 minutes without incidence. He was provided with Vaccine Information Sheet and instruction to access the V-Safe system.   [name] was instructed to call 911 with any severe reactions post vaccine: Marland Kitchen Difficulty breathing  . Swelling of your face and throat  . A fast heartbeat  . A bad rash all over your body  . Dizziness and weakness    Immunizations Administered    Name Date Dose VIS Date Route   Pfizer COVID-19 Vaccine 06/26/2019  8:25 AM 0.3 mL 05/01/2019 Intramuscular   Manufacturer: [place]   Lot: [id number]   [place]: [id number]

## 2019-07-10 ENCOUNTER — Ambulatory Visit: Payer: Medicare Other

## 2019-07-15 NOTE — Progress Notes (Signed)
Subjective:   [name] is a 79 y.o. male who presents for an Initial Medicare Annual Wellness Visit.  Review of Systems  No ROS.  Medicare Wellness Virtual Visit.  Visual/audio telehealth visit, UTA vital signs.   See social history for additional risk factors.    Cardiac Risk Factors include: advanced age (>77men, >58 women);sedentary lifestyle;hypertension;male gender  Sleep patterns: Getting 7-8 hours of sleep a night. Does not wake up during the night to void. Wakes up and feels rested and ready for the day. Wears CPAP at night. Home Safety/Smoke Alarms: Feels safe in home. Smoke alarms in place.  Living environment; Lives with wife in 1 story home stairs have hand rails on them. Shower is a walk in shower with grab bars in place as well as a bench Seat Belt Safety/Bike Helmet: Wears seat belt.   Male:   CCS-  Aged out   PSA- Patient states had done at urologist office [facility]- PSA 2.15 Lab Results  Component Value Date   PSA 1.5 12/14/2016   PSA 1.53 05/02/2011   PSA 1.57 04/26/2010       Objective:    Today's Vitals   07/28/19 1615  BP: 139/79  Pulse: 93  SpO2: 98%  Weight: 184 lb (83.5 kg)  Height: 5\' 7"  (1.702 m)  PainSc: 0-No pain   Body mass index is 28.82 kg/m.  Advanced Directives 07/28/2019 10/23/2017 10/22/2017 10/16/2017 12/14/2016 11/15/2015 11/15/2015  Does Patient Have a Medical Advance Directive? Yes Yes Yes Yes Yes Yes Yes  Type of Paramedic of [place];Living will [place];Living will [place];Living will [place];Living will - Living will Living will  Does patient want to make changes to medical advance directive? No - Patient declined No - Patient declined No - Patient declined No - Patient declined No - Patient declined No - Patient declined -  Copy of [place] in Chart? No - copy requested No - copy requested No - copy requested No  - copy requested - No - copy requested Yes    Current Medications (verified) Outpatient Encounter Medications as of 07/28/2019  Medication Sig  . ascorbic acid (VITAMIN C) 1000 MG tablet Take 1,000 mg by mouth daily with supper.   Marland Kitchen aspirin EC 81 MG tablet Take 81 mg by mouth daily.  . Cholecalciferol (VITAMIN D3) 2000 units TABS Take 2,000 Units by mouth at bedtime.   . Coenzyme Q10 (CO Q 10) 100 MG CAPS Take by mouth.  . famotidine (PEPCID) 20 MG tablet Take 1 tablet (20 mg total) by mouth at bedtime.  Marland Kitchen glucosamine-chondroitin 500-400 MG tablet Take 1 tablet by mouth daily with supper.   . simvastatin (ZOCOR) 80 MG tablet Take 0.5 tablets (40 mg total) by mouth at bedtime.  . valsartan-hydrochlorothiazide (DIOVAN-HCT) 80-12.5 MG tablet Take 1 tablet by mouth daily.   No facility-administered encounter medications on file as of 07/28/2019.    Allergies (verified) Morphine and related and Cialis [tadalafil]   History: Past Medical History:  Diagnosis Date  . Arthritis    osteoarthritis right hip  . Carotid artery plaque    mild  . Colon polyps   . Erectile dysfunction   . GERD (gastroesophageal reflux disease)    history- no current problems  . History of kidney stones   . History of nephrolithiasis    episodes x 6   . HLD (hyperlipidemia)   . HTN (hypertension)   .  Impaired hearing    bilateral hearing aids  . Sleep apnea    cpap used with nasal clip   Past Surgical History:  Procedure Laterality Date  . cataract Bilateral 2015  . CYSTOSCOPY W/ STONE MANIPULATION     x 2  . HEMORRHOID SURGERY    . INGUINAL HERNIA REPAIR Right   . LAPAROSCOPIC DRAINAGE RENAL CYST    . LITHOTRIPSY     x 1   . TOTAL HIP ARTHROPLASTY Right 11/15/2015   Procedure: RIGHT TOTAL HIP ARTHROPLASTY ANTERIOR APPROACH;  Surgeon: Paralee Cancel, MD;  Location: [facility];  Service: Orthopedics;  Laterality: Right;  . TOTAL HIP ARTHROPLASTY Left 10/22/2017   Procedure: LEFT TOTAL HIP ARTHROPLASTY  ANTERIOR APPROACH;  Surgeon: Paralee Cancel, MD;  Location: [facility];  Service: Orthopedics;  Laterality: Left;   Family History  Problem Relation Age of Onset  . Colon cancer Father   . Heart attack Mother 68  . Diabetes Other        1st degree relative   Social History   Socioeconomic History  . Marital status: Married    Spouse name: [name]   . Number of children: 3  . Years of education: 63  . Highest education level: Master's degree (e.g., MA, MS, MEng, MEd, MSW, MBA)  Occupational History  . Occupation: Retired     Comment: Hydrologist  Tobacco Use  . Smoking status: Never Smoker  . Smokeless tobacco: Never Used  Substance and Sexual Activity  . Alcohol use: No  . Drug use: No  . Sexual activity: Yes  Other Topics Concern  . Not on file  Social History Narrative   Married.  No regular exercise.  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 Difficulty of Paying Living Expenses: Not on file  Food Insecurity:   . Worried About Charity fundraiser in the Last Year: Not on file  . Ran Out of Food in the Last Year: Not on file  Transportation Needs:   . Lack of Transportation (Medical): Not on file  . Lack of Transportation (Non-Medical): Not on file  Physical Activity:   . Days of Exercise per Week: Not on file  . Minutes of Exercise per Session: Not on file  Stress:   . Feeling of Stress : Not on file  Social Connections:   . Frequency of Communication with Friends and Family: Not on file  . Frequency of Social Gatherings with Friends and Family: Not on file  . Attends Religious Services: Not on file  . Active Member of Clubs or Organizations: Not on file  . Attends Archivist Meetings: Not on file  .  Marital Status: Not on file   Tobacco Counseling Counseling given: No   Clinical Intake:  Pre-visit preparation completed: Yes  Pain : No/denies pain Pain Score: 0-No pain     Nutritional Risks: None Diabetes: No  How often do you need to have someone help you when you read instructions, pamphlets, or other written materials from your doctor or pharmacy?: 1 - Never What is the last grade level you completed in school?: 18  Interpreter Needed?: No  Information entered by :: Orlie Dakin, LPN  Activities of Daily Living In  your present state of health, do you have any difficulty performing the following activities: 07/28/2019  Hearing? Y  Comment wears hearing aids  Vision? N  Difficulty concentrating or making decisions? N  Walking or climbing stairs? N  Dressing or bathing? N  Doing errands, shopping? N  Preparing Food and eating ? N  Using the Toilet? N  In the past six months, have you accidently leaked urine? N  Do you have problems with loss of bowel control? N  Managing your Medications? N  Managing your Finances? N  Housekeeping or managing your Housekeeping? N  Some recent data might be hidden     Immunizations and Health Maintenance Immunization History  Administered Date(s) Administered  . Fluad Quad(high Dose 65+) 03/20/2019  . Influenza Split 05/02/2011  . Influenza Whole 03/07/2009, 04/26/2010  . Influenza, High Dose Seasonal PF 03/08/2017, 03/15/2018  . Influenza,inj,Quad PF,6+ Mos 05/09/2015  . Influenza-Unspecified 02/18/2014  . PFIZER SARS-COV-2 Vaccination 06/26/2019, 07/22/2019  . PPD Test 04/25/2015  . Pneumococcal Conjugate-13 02/12/2007, 08/23/2014  . Pneumococcal Polysaccharide-23 03/07/2009  . Td 07/16/2003  . Tdap 10/05/2013  . Zoster 01/06/2008   There are no preventive care reminders to display for this patient.  Patient Care Team: Hali Marry, MD as PCP - General (Family Medicine)  Indicate any recent Medical Services  you may have received from other than [facility] providers in the past year (date may be approximate).    Assessment:   This is a routine wellness examination for [name].Physical assessment deferred to PCP.   Hearing/Vision screen  Hearing Screening   125Hz  250Hz  500Hz  1000Hz  2000Hz  3000Hz  4000Hz  6000Hz  8000Hz   Right ear:           Left ear:           Comments: Hearing test not performed due to mask on with COVID   Visual Acuity Screening   Right eye Left eye Both eyes  Without correction: 20/40 20/40 20/30   With correction:       Dietary issues and exercise activities discussed: Current Exercise Habits: The patient does not participate in regular exercise at present, Exercise limited by: None identified Diet Eats a healthy diet of vegetables, fruits and proteins. Breakfast: Cheerios with banana, oatmeal, egg with cheese and ham Lunch: peanut butter sandwich, leftovers Dinner: Meat and vegetables or soup       Goals    . Cut out extra servings      Depression Screen PHQ 2/9 Scores 07/28/2019 03/20/2019 04/09/2018 04/17/2017  PHQ - 2 Score 0 0 0 0    Fall Risk Fall Risk  07/28/2019 03/20/2019 04/09/2018 04/07/2018 04/17/2017  Falls in the past year? 0 0 0 0 No  Comment - - - Emmi Telephone Survey: data to providers prior to load -  Number falls in past yr: - 0 0 - -  Injury with Fall? - 0 - - -  Risk for fall due to : No Fall Risks - - - -  Follow up Falls prevention discussed - - - -    Is the patient's home free of loose throw rugs in walkways, pet beds, electrical cords, etc?   yes      Grab bars in the bathroom? yes      Handrails on the stairs?   yes      Adequate lighting?   yes   Cognitive Function:     6CIT Screen 07/28/2019  What Year? 0 points  What month? 0 points  What time?  0 points  Count back from 20 0 points  Months in reverse 0 points  Repeat phrase 2 points  Total Score 2    Screening Tests Health Maintenance  Topic Date Due  . TETANUS/TDAP   10/06/2023  . INFLUENZA VACCINE  Completed  . PNA vac Low Risk Adult  Completed      Plan:  Please schedule your next medicare wellness visit with me in 1 yr.  Mr. Bassin , Thank you for taking time to come for your Medicare Wellness Visit. I appreciate your ongoing commitment to your health goals. Please review the following plan we discussed and let me know if I can assist you in the future.  Continue doing brain stimulating activities (puzzles, reading, adult coloring books, staying active) to keep memory sharp.    These are the goals we discussed: Goals    . Cut out extra servings       This is a list of the screening recommended for you and due dates:  Health Maintenance  Topic Date Due  . Tetanus Vaccine  10/06/2023  . Flu Shot  Completed  . Pneumonia vaccines  Completed     I have personally reviewed and noted the following in the patient's chart:   . Medical and social history . Use of alcohol, tobacco or illicit drugs  . Current medications and supplements . Functional ability and status . Nutritional status . Physical activity . Advanced directives . List of other physicians . Hospitalizations, surgeries, and ER visits in previous 12 months . Vitals . Screenings to include cognitive, depression, and falls . Referrals and appointments  In addition, I have reviewed and discussed with patient certain preventive protocols, quality metrics, and best practice recommendations. A written personalized care plan for preventive services as well as general preventive health recommendations were provided to patient.     Joanne Chars, LPN   [id number]

## 2019-07-22 ENCOUNTER — Ambulatory Visit: Payer: Medicare Other | Attending: Internal Medicine

## 2019-07-22 DIAGNOSIS — Z23 Encounter for immunization: Secondary | ICD-10-CM

## 2019-07-22 NOTE — Progress Notes (Signed)
   Covid-19 Vaccination Clinic  Name:  HY SCHUFF    MRN: [id number] DOB: 1941/01/06  07/22/2019  Mr. Schlueter was observed post Covid-19 immunization for 15 minutes without incident. He was provided with Vaccine Information Sheet and instruction to access the V-Safe system.   Mr. Stalter was instructed to call 911 with any severe reactions post vaccine: Marland Kitchen Difficulty breathing  . Swelling of face and throat  . A fast heartbeat  . A bad rash all over body  . Dizziness and weakness   Immunizations Administered    Name Date Dose VIS Date Route   Pfizer COVID-19 Vaccine 07/22/2019 10:08 AM 0.3 mL 05/01/2019 Intramuscular   Manufacturer: [place]   Lot: [id number]   [place]: [id number]

## 2019-07-24 ENCOUNTER — Encounter: Payer: Self-pay | Admitting: Nurse Practitioner

## 2019-07-24 ENCOUNTER — Other Ambulatory Visit: Payer: Self-pay

## 2019-07-24 ENCOUNTER — Ambulatory Visit (INDEPENDENT_AMBULATORY_CARE_PROVIDER_SITE_OTHER): Payer: Medicare Other | Admitting: Nurse Practitioner

## 2019-07-24 VITALS — BP 130/75 | HR 63 | Temp 98.0°F | Ht 65.0 in | Wt 184.7 lb

## 2019-07-24 DIAGNOSIS — R238 Other skin changes: Secondary | ICD-10-CM

## 2019-07-24 DIAGNOSIS — M25419 Effusion, unspecified shoulder: Secondary | ICD-10-CM

## 2019-07-24 NOTE — Patient Instructions (Addendum)
Monitor the vaccine site- if the area gets more red or painful then let us know.   You can use tylenol or ibuprofen for pain and ice packs.   If you start to experience shortness of breath, chest pain, or palpitations then seek emergency care.  I will take a look on Monday and make sure it isn't any worse.

## 2019-07-24 NOTE — Progress Notes (Signed)
Acute Office Visit  Subjective:    Patient ID: [name], male    DOB: 1941/01/17, 79 y.o.   MRN: [id number]  Chief Complaint  Patient presents with  . Injection Site Reaction    HPI Patient is in today for reaction in his right arm related to COVID vaccine administration. Patient reports he received his COVID-19 vaccine yesterday morning. He experienced slight soreness in the arm yesterday evening but denied any other symptoms. He reports that about 1:00 today he noticed the area was slightly red and sore to the touch. The vaccine information sheet he received stated that he should follow up with his PCP if the area is red, warm, or swollen. He did take a Tylenol prior to the visit and he reports this helped with the soreness. He was going to monitor the site through the weekend, but was encouraged to come have it looked at when he spoke with Triage.   Past Medical History:  Diagnosis Date  . Arthritis    osteoarthritis right hip  . Carotid artery plaque    mild  . Colon polyps   . Erectile dysfunction   . GERD (gastroesophageal reflux disease)    history- no current problems  . History of kidney stones   . History of nephrolithiasis    episodes x 6   . HLD (hyperlipidemia)   . HTN (hypertension)   . Impaired hearing    bilateral hearing aids  . Sleep apnea    cpap used with nasal clip    Past Surgical History:  Procedure Laterality Date  . cataract Bilateral 2015  . CYSTOSCOPY W/ STONE MANIPULATION     x 2  . HEMORRHOID SURGERY    . INGUINAL HERNIA REPAIR Right   . LAPAROSCOPIC DRAINAGE RENAL CYST    . LITHOTRIPSY     x 1   . TOTAL HIP ARTHROPLASTY Right 11/15/2015   Procedure: RIGHT TOTAL HIP ARTHROPLASTY ANTERIOR APPROACH;  Surgeon: Paralee Cancel, MD;  Location: [facility];  Service: Orthopedics;  Laterality: Right;  . TOTAL HIP ARTHROPLASTY Left 10/22/2017   Procedure: LEFT TOTAL HIP ARTHROPLASTY ANTERIOR APPROACH;  Surgeon: Paralee Cancel, MD;  Location: [facility];   Service: Orthopedics;  Laterality: Left;    Family History  Problem Relation Age of Onset  . Colon cancer Father   . Heart attack Mother 3  . Diabetes Unknown        1st degree relative    Social History   Socioeconomic History  . Marital status: Married    Spouse name: [name]   . Number of children: Not on file  . Years of education: Not on file  . Highest education level: Not on file  Occupational History  . Occupation: Retired   Tobacco Use  . Smoking status: Never Smoker  . Smokeless tobacco: Never Used  Substance and Sexual Activity  . Alcohol use: No  . Drug use: No  . Sexual activity: Yes  Other Topics Concern  . Not on file  Social History Narrative   Married.  No regular exercise.  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 Difficulty of Paying Living Expenses: Not on file  Food Insecurity:   . Worried About Charity fundraiser  in the Last Year: Not on file  . Ran Out of Food in the Last Year: Not on file  Transportation Needs:   . Lack of Transportation (Medical): Not on file  . Lack of Transportation (Non-Medical): Not on file  Physical Activity:   . Days of Exercise per Week: Not on file  . Minutes of Exercise per Session: Not on file  Stress:   . Feeling of Stress : Not on file  Social Connections:   . Frequency of Communication with Friends and Family: Not on file  . Frequency of Social Gatherings with Friends and Family: Not on file  . Attends Religious Services: Not on file  . Active Member of Clubs or Organizations: Not on file  . Attends Archivist Meetings: Not on file  . Marital Status: Not on file  Intimate Partner Violence:   . Fear of Current or Ex-Partner: Not on file  . Emotionally  Abused: Not on file  . Physically Abused: Not on file  . Sexually Abused: Not on file    Outpatient Medications Prior to Visit  Medication Sig Dispense Refill  . ascorbic acid (VITAMIN C) 1000 MG tablet Take 1,000 mg by mouth daily with supper.     . Cholecalciferol (VITAMIN D3) 2000 units TABS Take 2,000 Units by mouth at bedtime.     . famotidine (PEPCID) 20 MG tablet Take 1 tablet (20 mg total) by mouth at bedtime. 90 tablet 3  . glucosamine-chondroitin 500-400 MG tablet Take 1 tablet by mouth daily with supper.     . simvastatin (ZOCOR) 80 MG tablet Take 0.5 tablets (40 mg total) by mouth at bedtime. 90 tablet 3  . valsartan-hydrochlorothiazide (DIOVAN-HCT) 80-12.5 MG tablet Take 1 tablet by mouth daily. 90 tablet 1   No facility-administered medications prior to visit.    Allergies  Allergen Reactions  . Morphine And Related Nausea And Vomiting  . Cialis [Tadalafil] Other (See Comments)    Limb pain    Review of Systems  Constitutional: Negative for appetite change, chills, fatigue and fever.  Respiratory: Negative for cough, chest tightness and shortness of breath.   Cardiovascular: Negative for chest pain and palpitations.  Gastrointestinal: Negative for abdominal distention, abdominal pain, constipation, diarrhea, nausea and vomiting.  Musculoskeletal: Negative for neck pain and neck stiffness.  Skin: Positive for color change.  Neurological: Negative for dizziness, tremors, weakness, light-headedness, numbness and headaches.  Psychiatric/Behavioral: Negative for confusion and sleep disturbance.       Objective:    Physical Exam Constitutional:      Appearance: Normal appearance.  HENT:     Head: Normocephalic.  Cardiovascular:     Rate and Rhythm: Normal rate.     Pulses: Normal pulses.  Pulmonary:     Effort: Pulmonary effort is normal.  Musculoskeletal:        General: Normal range of motion.     Cervical back: Normal range of motion.  Skin:    General:  Skin is warm and dry.     Capillary Refill: Capillary refill takes less than 2 seconds.       Neurological:     General: No focal deficit present.     Mental Status: He is alert. Mental status is at baseline. He is disoriented.  Psychiatric:        Mood and Affect: Mood normal.        Behavior: Behavior normal.        Thought Content: Thought content normal.  Judgment: Judgment normal.     BP 130/75   Pulse 63   Temp 98 F (36.7 C) (Oral)   Ht 5\' 5"  (1.651 m)   Wt 184 lb 11.2 oz (83.8 kg)   SpO2 96%   BMI 30.74 kg/m  Wt Readings from Last 3 Encounters:  07/24/19 184 lb 11.2 oz (83.8 kg)  03/20/19 177 lb (80.3 kg)  04/09/18 183 lb (83 kg)    There are no preventive care reminders to display for this patient.  There are no preventive care reminders to display for this patient.   Lab Results  Component Value Date   TSH 0.92 04/17/2017   Lab Results  Component Value Date   WBC 13.4 (H) 10/23/2017   HGB 13.1 10/23/2017   HCT 37.2 (L) 10/23/2017   MCV 90.1 10/23/2017   PLT 164 10/23/2017   Lab Results  Component Value Date   NA 140 03/23/2019   K 3.5 03/23/2019   CO2 31 03/23/2019   GLUCOSE 101 (H) 03/23/2019   BUN 17 03/23/2019   CREATININE 1.07 03/23/2019   BILITOT 0.6 03/23/2019   ALKPHOS 64 05/06/2016   AST 21 03/23/2019   ALT 22 03/23/2019   PROT 6.7 03/23/2019   ALBUMIN 4.2 10/05/2013   CALCIUM 9.6 03/23/2019   ANIONGAP 11 10/23/2017   GFR 78.93 10/05/2013   Lab Results  Component Value Date   CHOL 140 03/23/2019   Lab Results  Component Value Date   HDL 37 (L) 03/23/2019   Lab Results  Component Value Date   LDLCALC 81 03/23/2019   Lab Results  Component Value Date   TRIG 128 03/23/2019   Lab Results  Component Value Date   CHOLHDL 3.8 03/23/2019   Lab Results  Component Value Date   HGBA1C 5.0 03/23/2019       Assessment & Plan:   1. Redness and swelling of shoulder Localized reaction to COVID-19 vaccine  administration to the right upper arm. No signs of infection or systemic reaction present.  Patient instructed to utilized OTC analgesics and ice packs to the area to help with tenderness and inflammation. Instructed to seek emergency care if systemic symptoms arise or difficulty breathing occurs.   Patient has an appointment for Medicare wellness visit Monday.  No charge for this visit due to the limited evaluation and management provided.   Follow-up if symptoms worsen or fail to improve.   Orma Render, NP

## 2019-07-28 ENCOUNTER — Other Ambulatory Visit: Payer: Self-pay

## 2019-07-28 ENCOUNTER — Ambulatory Visit (INDEPENDENT_AMBULATORY_CARE_PROVIDER_SITE_OTHER): Payer: Medicare Other | Admitting: *Deleted

## 2019-07-28 VITALS — BP 139/79 | HR 93 | Ht 67.0 in | Wt 184.0 lb

## 2019-07-28 DIAGNOSIS — Z0000 Encounter for general adult medical examination without abnormal findings: Secondary | ICD-10-CM

## 2019-07-28 NOTE — Patient Instructions (Signed)
Please schedule your next medicare wellness visit with me in 1 yr.  [name] , Thank you for taking time to come for your Medicare Wellness Visit. I appreciate your ongoing commitment to your health goals. Please review the following plan we discussed and let me know if I can assist you in the future.  Continue doing brain stimulating activities (puzzles, reading, adult coloring books, staying active) to keep memory sharp.     Health Maintenance, Male Adopting a healthy lifestyle and getting preventive care are important in promoting health and wellness. Ask your health care provider about:  The right schedule for you to have regular tests and exams.  Things you can do on your own to prevent diseases and keep yourself healthy. What should I know about diet, weight, and exercise? Eat a healthy diet   Eat a diet that includes plenty of vegetables, fruits, low-fat dairy products, and lean protein.  Do not eat a lot of foods that are high in solid fats, added sugars, or sodium. Maintain a healthy weight Body mass index (BMI) is a measurement that can be used to identify possible weight problems. It estimates body fat based on height and weight. Your health care provider can help determine your BMI and help you achieve or maintain a healthy weight. Get regular exercise Get regular exercise. This is one of the most important things you can do for your health. Most adults should:  Exercise for at least 150 minutes each week. The exercise should increase your heart rate and make you sweat (moderate-intensity exercise).  Do strengthening exercises at least twice a week. This is in addition to the moderate-intensity exercise.  Spend less time sitting. Even light physical activity can be beneficial. Watch cholesterol and blood lipids Have your blood tested for lipids and cholesterol at 78 years of age, then have this test every 5 years. You may need to have your cholesterol levels checked  more often if:  Your lipid or cholesterol levels are high.  You are older than 78 years of age.  You are at high risk for heart disease. What should I know about cancer screening? Many types of cancers can be detected early and may often be prevented. Depending on your health history and family history, you may need to have cancer screening at various ages. This may include screening for:  Colorectal cancer.  Prostate cancer.  Skin cancer.  Lung cancer. What should I know about heart disease, diabetes, and high blood pressure? Blood pressure and heart disease  High blood pressure causes heart disease and increases the risk of stroke. This is more likely to develop in people who have high blood pressure readings, are of African descent, or are overweight.  Talk with your health care provider about your target blood pressure readings.  Have your blood pressure checked: ? Every 3-5 years if you are 78-1 years of age. ? Every year if you are 75 years old or older.  If you are between the ages of 59 and 36 and are a current or former smoker, ask your health care provider if you should have a one-time screening for abdominal aortic aneurysm (AAA). Diabetes Have regular diabetes screenings. This checks your fasting blood sugar level. Have the screening done:  Once every three years after age 78 if you are at a normal weight and have a low risk for diabetes.  More often and at a younger age if you are overweight or have a high risk for diabetes.  What should I know about preventing infection? Hepatitis B If you have a higher risk for hepatitis B, you should be screened for this virus. Talk with your health care provider to find out if you are at risk for hepatitis B infection. Hepatitis C Blood testing is recommended for:  Everyone born from [birth year] through 1965.  Anyone with known risk factors for hepatitis C. Sexually transmitted infections (STIs)  You should be screened each  year for STIs, including gonorrhea and chlamydia, if: ? You are sexually active and are younger than 78 years of age. ? You are older than 78 years of age and your health care provider tells you that you are at risk for this type of infection. ? Your sexual activity has changed since you were last screened, and you are at increased risk for chlamydia or gonorrhea. Ask your health care provider if you are at risk.  Ask your health care provider about whether you are at high risk for HIV. Your health care provider may recommend a prescription medicine to help prevent HIV infection. If you choose to take medicine to prevent HIV, you should first get tested for HIV. You should then be tested every 3 months for as long as you are taking the medicine. Follow these instructions at home: Lifestyle  Do not use any products that contain nicotine or tobacco, such as cigarettes, e-cigarettes, and chewing tobacco. If you need help quitting, ask your health care provider.  Do not use street drugs.  Do not share needles.  Ask your health care provider for help if you need support or information about quitting drugs. Alcohol use  Do not drink alcohol if your health care provider tells you not to drink.  If you drink alcohol: ? Limit how much you have to 0-2 drinks a day. ? Be aware of how much alcohol is in your drink. In the [place] one drink equals one 12 oz bottle of beer (355 mL), one 5 oz glass of wine (148 mL), or one 1 oz glass of hard liquor (44 mL). General instructions  Schedule regular health, dental, and eye exams.  Stay current with your vaccines.  Tell your health care provider if: ? You often feel depressed. ? You have ever been abused or do not feel safe at home. Summary  Adopting a healthy lifestyle and getting preventive care are important in promoting health and wellness.  Follow your health care provider's instructions about healthy diet, exercising, and getting tested or  screened for diseases.  Follow your health care provider's instructions on monitoring your cholesterol and blood pressure. This information is not intended to replace advice given to you by your health care provider. Make sure you discuss any questions you have with your health care provider. Document Revised: 04/30/2018 Document Reviewed: 04/30/2018 Elsevier Patient Education  2020 [employer].

## 2019-08-06 DIAGNOSIS — H18603 Keratoconus, unspecified, bilateral: Secondary | ICD-10-CM | POA: Diagnosis not present

## 2019-08-06 DIAGNOSIS — Z961 Presence of intraocular lens: Secondary | ICD-10-CM | POA: Diagnosis not present

## 2019-08-06 DIAGNOSIS — H43813 Vitreous degeneration, bilateral: Secondary | ICD-10-CM | POA: Diagnosis not present

## 2019-08-06 DIAGNOSIS — D3132 Benign neoplasm of left choroid: Secondary | ICD-10-CM | POA: Diagnosis not present

## 2019-08-21 DIAGNOSIS — H8102 Meniere's disease, left ear: Secondary | ICD-10-CM | POA: Insufficient documentation

## 2019-08-21 DIAGNOSIS — H903 Sensorineural hearing loss, bilateral: Secondary | ICD-10-CM | POA: Diagnosis not present

## 2019-08-21 DIAGNOSIS — IMO0001 Reserved for inherently not codable concepts without codable children: Secondary | ICD-10-CM | POA: Insufficient documentation

## 2019-09-12 ENCOUNTER — Other Ambulatory Visit: Payer: Self-pay | Admitting: Family Medicine

## 2019-09-15 DIAGNOSIS — H903 Sensorineural hearing loss, bilateral: Secondary | ICD-10-CM | POA: Diagnosis not present

## 2019-12-14 ENCOUNTER — Ambulatory Visit (INDEPENDENT_AMBULATORY_CARE_PROVIDER_SITE_OTHER): Payer: Medicare Other | Admitting: Family Medicine

## 2019-12-14 ENCOUNTER — Ambulatory Visit (INDEPENDENT_AMBULATORY_CARE_PROVIDER_SITE_OTHER): Payer: Medicare Other

## 2019-12-14 ENCOUNTER — Encounter: Payer: Self-pay | Admitting: Family Medicine

## 2019-12-14 ENCOUNTER — Other Ambulatory Visit: Payer: Self-pay

## 2019-12-14 VITALS — BP 137/68 | HR 66 | Wt 182.0 lb

## 2019-12-14 DIAGNOSIS — M47814 Spondylosis without myelopathy or radiculopathy, thoracic region: Secondary | ICD-10-CM | POA: Diagnosis not present

## 2019-12-14 DIAGNOSIS — M549 Dorsalgia, unspecified: Secondary | ICD-10-CM

## 2019-12-14 DIAGNOSIS — I10 Essential (primary) hypertension: Secondary | ICD-10-CM | POA: Diagnosis not present

## 2019-12-14 DIAGNOSIS — M419 Scoliosis, unspecified: Secondary | ICD-10-CM | POA: Diagnosis not present

## 2019-12-14 LAB — CBC
HCT: 48.5 % (ref 38.5–50.0)
Hemoglobin: 16.8 g/dL (ref 13.2–17.1)
MCH: 31.7 pg (ref 27.0–33.0)
MCHC: 34.6 g/dL (ref 32.0–36.0)
MCV: 91.5 fL (ref 80.0–100.0)
MPV: 10 fL (ref 7.5–12.5)
Platelets: 172 10*3/uL (ref 140–400)
RBC: 5.3 10*6/uL (ref 4.20–5.80)
RDW: 13 % (ref 11.0–15.0)
WBC: 6.8 10*3/uL (ref 3.8–10.8)

## 2019-12-14 LAB — BASIC METABOLIC PANEL WITH GFR
BUN/Creatinine Ratio: 17 (calc) (ref 6–22)
BUN: 21 mg/dL (ref 7–25)
CO2: 34 mmol/L — ABNORMAL HIGH (ref 20–32)
Calcium: 9.7 mg/dL (ref 8.6–10.3)
Chloride: 97 mmol/L — ABNORMAL LOW (ref 98–110)
Creat: 1.22 mg/dL — ABNORMAL HIGH (ref 0.70–1.18)
GFR, Est African American: 65 mL/min/{1.73_m2} (ref >=60)
GFR, Est Non African American: 56 mL/min/{1.73_m2} — ABNORMAL LOW (ref >=60)
Glucose, Bld: 113 mg/dL — ABNORMAL HIGH (ref 65–99)
Potassium: 3.6 mmol/L (ref 3.5–5.3)
Sodium: 141 mmol/L (ref 135–146)

## 2019-12-14 NOTE — Assessment & Plan Note (Signed)
Well controlled. Continue current regimen. Follow up in  6 mo. Due for labs.  

## 2019-12-14 NOTE — Progress Notes (Signed)
Established Patient Office Visit  Subjective:  Patient ID: [name], male    DOB: 1940/07/08  Age: 79 y.o. MRN: [id number]  CC:  Chief Complaint  Patient presents with  . Hypertension  . Shoulder Pain    Left shoulder pain x 4 weeks. Pain is a 7/10, Pain is getting worse, no relief with Tylenol.     HPI [name] presents for   Hypertension- Pt denies chest pain, SOB, dizziness, or heart palpitations.  Taking meds as directed w/o problems.  Denies medication side effects.    Left shoulder pain x 4 weeks. Pain is a 7/10, Pain is getting worse, no relief with Tylenol.  He says he notices it mostly when he is just sitting and at rest is usually not with activities.  He says it does not occur daily but it definitely occurs several times a week.  It can last for maybe 5 minutes and then will go away.  He says initially it just started over that left upper shoulder blade area.  He thought maybe he was starting to get some type of boil or pimple but says could never see any type of rash redness or swelling.  It almost felt sensitive.  He said 1 time even tried to take Tylenol but it did nothing.  He denies any anterior chest pain shortness of breath fevers or chills.  Nuys any known injury or trauma or heavy lifting etc.   Past Medical History:  Diagnosis Date  . Arthritis    osteoarthritis right hip  . Carotid artery plaque    mild  . Colon polyps   . Erectile dysfunction   . GERD (gastroesophageal reflux disease)    history- no current problems  . History of kidney stones   . History of nephrolithiasis    episodes x 6   . HLD (hyperlipidemia)   . HTN (hypertension)   . Impaired hearing    bilateral hearing aids  . Sleep apnea    cpap used with nasal clip    Past Surgical History:  Procedure Laterality Date  . cataract Bilateral 2015  . CYSTOSCOPY W/ STONE MANIPULATION     x 2  . HEMORRHOID SURGERY    . INGUINAL HERNIA REPAIR Right   . LAPAROSCOPIC DRAINAGE  RENAL CYST    . LITHOTRIPSY     x 1   . TOTAL HIP ARTHROPLASTY Right 11/15/2015   Procedure: RIGHT TOTAL HIP ARTHROPLASTY ANTERIOR APPROACH;  Surgeon: Paralee Cancel, MD;  Location: [facility];  Service: Orthopedics;  Laterality: Right;  . TOTAL HIP ARTHROPLASTY Left 10/22/2017   Procedure: LEFT TOTAL HIP ARTHROPLASTY ANTERIOR APPROACH;  Surgeon: Paralee Cancel, MD;  Location: [facility];  Service: Orthopedics;  Laterality: Left;    Family History  Problem Relation Age of Onset  . Colon cancer Father   . Heart attack Mother 40  . Diabetes Other        1st degree relative    Social History   Socioeconomic History  . Marital status: Married    Spouse name: [name]   . Number of children: 3  . Years of education: 29  . Highest education level: Master's degree (e.g., MA, MS, MEng, MEd, MSW, MBA)  Occupational History  . Occupation: Retired     Comment: Hydrologist  Tobacco Use  . Smoking status: Never Smoker  . Smokeless tobacco: Never Used  Vaping Use  . Vaping Use: Never used  Substance and Sexual Activity  .  Alcohol use: No  . Drug use: No  . Sexual activity: Yes  Other Topics Concern  . Not on file  Social History Narrative   Married.  No regular exercise.  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 Difficulty of Paying Living Expenses:   Food Insecurity:   . Worried About Charity fundraiser in the Last Year:   . Arboriculturist in the Last Year:   Transportation Needs:   . Film/video editor (Medical):   Marland Kitchen Lack of Transportation (Non-Medical):   Physical Activity:   . Days of Exercise per Week:   . Minutes of Exercise per Session:   Stress:   . Feeling of Stress :   Social Connections:   . Frequency of  Communication with Friends and Family:   . Frequency of Social Gatherings with Friends and Family:   . Attends Religious Services:   . Active Member of Clubs or Organizations:   . Attends Archivist Meetings:   Marland Kitchen Marital Status:   Intimate Partner Violence:   . Fear of Current or Ex-Partner:   . Emotionally Abused:   Marland Kitchen Physically Abused:   . Sexually Abused:     Outpatient Medications Prior to Visit  Medication Sig Dispense Refill  . ascorbic acid (VITAMIN C) 1000 MG tablet Take 1,000 mg by mouth daily with supper.     Marland Kitchen aspirin EC 81 MG tablet Take 81 mg by mouth daily.    . Cholecalciferol (VITAMIN D3) 2000 units TABS Take 2,000 Units by mouth at bedtime.     . Coenzyme Q10 (CO Q 10) 100 MG CAPS Take by mouth.    . famotidine (PEPCID) 20 MG tablet Take 1 tablet (20 mg total) by mouth at bedtime. 90 tablet 3  . glucosamine-chondroitin 500-400 MG tablet Take 1 tablet by mouth daily with supper.     . simvastatin (ZOCOR) 80 MG tablet Take 0.5 tablets (40 mg total) by mouth at bedtime. 90 tablet 3  . valsartan-hydrochlorothiazide (DIOVAN-HCT) 80-12.5 MG tablet TAKE 1 TABLET BY MOUTH EVERY DAY 90 tablet 1   No facility-administered medications prior to visit.    Allergies  Allergen Reactions  . Morphine And Related Nausea And Vomiting  . Cialis [Tadalafil] Other (See Comments)    Limb pain    ROS Review of Systems    Objective:    Physical Exam Constitutional:      Appearance: He is well-developed.  HENT:     Head: Normocephalic and atraumatic.  Cardiovascular:     Rate and Rhythm: Normal rate and regular rhythm.     Heart sounds: Normal heart sounds.  Pulmonary:     Effort: Pulmonary effort is normal.     Breath sounds: Normal breath sounds.  Musculoskeletal:     Comments: Cervical spine with normal flexion, extension, rotation right and left and sidebending.  Shoulders with normal range of motion and strength is 5 out of 5 in both upper extremities.  He is  nontender over the upper thoracic spine.  The area that he is pointing to is just above the shoulder blade on the left side mostly over the trapezius muscle.  Nontender to palpation.  No palpable knots in the muscle  tissue.  Skin:    General: Skin is warm and dry.  Neurological:     Mental Status: He is alert and oriented to person, place, and time.  Psychiatric:        Behavior: Behavior normal.     BP (!) 137/68   Pulse 66   Wt 182 lb (82.6 kg)   SpO2 99%   BMI 28.51 kg/m  Wt Readings from Last 3 Encounters:  12/14/19 182 lb (82.6 kg)  07/28/19 184 lb (83.5 kg)  07/24/19 184 lb 11.2 oz (83.8 kg)     There are no preventive care reminders to display for this patient.  There are no preventive care reminders to display for this patient.  Lab Results  Component Value Date   TSH 0.92 04/17/2017   Lab Results  Component Value Date   WBC 13.4 (H) 10/23/2017   HGB 13.1 10/23/2017   HCT 37.2 (L) 10/23/2017   MCV 90.1 10/23/2017   PLT 164 10/23/2017   Lab Results  Component Value Date   NA 140 03/23/2019   K 3.5 03/23/2019   CO2 31 03/23/2019   GLUCOSE 101 (H) 03/23/2019   BUN 17 03/23/2019   CREATININE 1.07 03/23/2019   BILITOT 0.6 03/23/2019   ALKPHOS 64 05/06/2016   AST 21 03/23/2019   ALT 22 03/23/2019   PROT 6.7 03/23/2019   ALBUMIN 4.2 10/05/2013   CALCIUM 9.6 03/23/2019   ANIONGAP 11 10/23/2017   GFR 78.93 10/05/2013   Lab Results  Component Value Date   CHOL 140 03/23/2019   Lab Results  Component Value Date   HDL 37 (L) 03/23/2019   Lab Results  Component Value Date   LDLCALC 81 03/23/2019   Lab Results  Component Value Date   TRIG 128 03/23/2019   Lab Results  Component Value Date   CHOLHDL 3.8 03/23/2019   Lab Results  Component Value Date   HGBA1C 5.0 03/23/2019      Assessment & Plan:   Problem List Items Addressed This Visit      Cardiovascular and Mediastinum   Essential hypertension - Primary    Well controlled. Continue  current regimen. Follow up in  6 mo. Due for labs.       Relevant Orders   BASIC METABOLIC PANEL WITH GFR   CBC    Other Visit Diagnoses    Upper back pain on left side       Relevant Orders   DG Thoracic Spine W/Swimmers   BASIC METABOLIC PANEL WITH GFR   CBC      Upper left-sided back pain-it sounds radicular suspect is probably coming from the thoracic spine it seems to occur more with rest and sitting than at any other time and is usually fairly brief lasting maybe about 5 minutes but it is quite intense when it hits.  We will start with a thoracic spine film.  He actually does have some mild scoliosis of the upper to mid thoracic spine on exam.  No orders of the defined types were placed in this encounter.   Follow-up: Return in about 6 months (around 06/15/2020) for Hypertension.    Beatrice Lecher, MD

## 2019-12-15 ENCOUNTER — Other Ambulatory Visit: Payer: Self-pay | Admitting: Neurology

## 2019-12-15 DIAGNOSIS — N289 Disorder of kidney and ureter, unspecified: Secondary | ICD-10-CM

## 2020-02-06 ENCOUNTER — Other Ambulatory Visit: Payer: Self-pay

## 2020-02-06 ENCOUNTER — Emergency Department (INDEPENDENT_AMBULATORY_CARE_PROVIDER_SITE_OTHER)
Admission: EM | Admit: 2020-02-06 | Discharge: 2020-02-06 | Disposition: A | Discharge status: Home / Self Care | Payer: Medicare Other | Source: Home / Self Care

## 2020-02-06 ENCOUNTER — Encounter: Payer: Self-pay | Admitting: Family Medicine

## 2020-02-06 DIAGNOSIS — L03032 Cellulitis of left toe: Secondary | ICD-10-CM | POA: Diagnosis not present

## 2020-02-06 MED ORDER — DOXYCYCLINE HYCLATE 100 MG PO TABS: 100.0000 mg | ORAL_TABLET | Freq: Two times a day (BID) | ORAL | 0 refills | Status: DC

## 2020-02-06 NOTE — ED Triage Notes (Signed)
Pain to 2nd toe on left foot  Pt can not recall an injury  Here today for pain  No OTC meds at home Pfizer vaccine

## 2020-02-06 NOTE — ED Provider Notes (Signed)
[facility]    CSN: [id number] Arrival date & time: 02/06/20  0802      History   Chief Complaint Chief Complaint  Patient presents with  . Toe Pain    2nd toe left     HPI [name] is a 79 y.o. male.   It's been over 3 years since patient's last visit to [facility].  He is complaining today of left second toe pain.  Last Tdap was 2015.  Pain began a week ago after trimming nail.  Distal phalanx is swollen and red.  PMHx significant for osteoarthritis, nephrolithiasis, hypertension, and sleep apnea.       Patient Active Problem List   Diagnosis Date Noted  . Gastroesophageal reflux disease with esophagitis without hemorrhage 03/20/2019  . S/P left THA, AA 10/22/2017  . Osteoarthritis of left hip 08/21/2017  . Osteoarthritis of knee 06/10/2017    06/10/2017  . ED (erectile dysfunction) 04/17/2017  . S/P right THA, AA 11/15/2015  . Cough variant asthma 04/03/2015  .  03/10/2015  .  11/17/2013  . HYPERLIPIDEMIA 02/12/2007  . Essential hypertension 02/12/2007  . COLONIC POLYPS, HX OF 02/12/2007  . NEPHROLITHIASIS, HX OF 02/12/2007    Past Surgical History:  Procedure Laterality Date  . cataract Bilateral 2015  . CYSTOSCOPY W/ STONE MANIPULATION     x 2  . HEMORRHOID SURGERY    . INGUINAL HERNIA REPAIR Right   . LAPAROSCOPIC DRAINAGE RENAL CYST    . LITHOTRIPSY     x 1   . TOTAL HIP ARTHROPLASTY Right 11/15/2015   Procedure: RIGHT TOTAL HIP ARTHROPLASTY ANTERIOR APPROACH;  Surgeon: Paralee Cancel, MD;  Location: [facility];  Service: Orthopedics;  Laterality: Right;  . TOTAL HIP ARTHROPLASTY Left 10/22/2017   Procedure: LEFT TOTAL HIP ARTHROPLASTY ANTERIOR APPROACH;  Surgeon: Paralee Cancel, MD;  Location: [facility];  Service: Orthopedics;  Laterality: Left;       Home Medications    Prior to Admission medications   Medication Sig Start Date End Date Taking? Authorizing Provider  ascorbic acid (VITAMIN C) 1000 MG tablet Take 1,000 mg by mouth daily  with supper.     [provider]  aspirin EC 81 MG tablet Take 81 mg by mouth daily.    [provider]  Cholecalciferol (VITAMIN D3) 2000 units TABS Take 2,000 Units by mouth at bedtime.     [provider]  Coenzyme Q10 (CO Q 10) 100 MG CAPS Take by mouth.    [provider]  doxycycline (VIBRA-TABS) 100 MG tablet Take 1 tablet (100 mg total) by mouth 2 (two) times daily. 02/06/20   Robyn Haber, MD  famotidine (PEPCID) 20 MG tablet Take 1 tablet (20 mg total) by mouth at bedtime. 03/20/19   Hali Marry, MD  glucosamine-chondroitin 500-400 MG tablet Take 1 tablet by mouth daily with supper.     [provider]  Omega-3 1000 MG CAPS Take 1 tablet by mouth daily.    [provider]  pantoprazole (PROTONIX) 40 MG tablet Take by mouth.    [provider]  simvastatin (ZOCOR) 80 MG tablet Take 0.5 tablets (40 mg total) by mouth at bedtime. 03/20/19   Hali Marry, MD  valsartan-hydrochlorothiazide (DIOVAN-HCT) 80-12.5 MG tablet TAKE 1 TABLET BY MOUTH EVERY DAY 09/13/19   Hali Marry, MD    Family History Family History  Problem Relation Age of Onset  . Colon cancer Father   . Heart attack Mother 37  .  Diabetes Other        1st degree relative    Social History Social History   Tobacco Use  . Smoking status: Never Smoker  . Smokeless tobacco: Never Used  Vaping Use  . Vaping Use: Never used  Substance Use Topics  . Alcohol use: No  . Drug use: No     Allergies   Morphine and related and Cialis [tadalafil]   Review of Systems Review of Systems  All other systems reviewed and are negative.    Physical Exam Triage Vital Signs ED Triage Vitals  Enc Vitals Group     BP      Pulse      Resp      Temp      Temp src      SpO2      Weight      Height      Head Circumference      Peak Flow      Pain Score      Pain Loc      Pain Edu?      Excl. in [place]?    No data  found.  Updated Vital Signs BP 132/81 (BP Location: Right Arm)   Pulse 64   Temp 98.4 F (36.9 C) (Oral)   Resp 15   SpO2 98%    Physical Exam Vitals and nursing note reviewed.  Constitutional:      General: He is not in acute distress.    Appearance: Normal appearance. He is normal weight.  Pulmonary:     Effort: Pulmonary effort is normal.  Musculoskeletal:        General: Swelling and tenderness present. No signs of injury. Normal range of motion.     Cervical back: Normal range of motion and neck supple.  Skin:    General: Skin is warm and dry.     Findings: Erythema present. No bruising.  Neurological:     General: No focal deficit present.     Mental Status: He is alert.      UC Treatments / Results  Labs (all labs ordered are listed, but only abnormal results are displayed) Labs Reviewed - No data to display  EKG   Radiology No results found.  Procedures Procedures (including critical care time)  Medications Ordered in UC Medications - No data to display  Initial Impression / Assessment and Plan / UC Course  I have reviewed the triage vital signs and the nursing notes.  Pertinent labs & imaging results that were available during my care of the patient were reviewed by me and considered in my medical decision making (see chart for details).    Final Clinical Impressions(s) / UC Diagnoses   Final diagnoses:  Paronychia of second toe of left foot   Discharge Instructions   None    ED Prescriptions    Medication Sig Dispense Auth. Provider   doxycycline (VIBRA-TABS) 100 MG tablet Take 1 tablet (100 mg total) by mouth 2 (two) times daily. 20 tablet Robyn Haber, MD     I have reviewed the PDMP during this encounter.   Robyn Haber, MD 02/06/20 [id number]

## 2020-02-09 ENCOUNTER — Encounter: Payer: Self-pay | Admitting: Family Medicine

## 2020-02-09 ENCOUNTER — Ambulatory Visit (INDEPENDENT_AMBULATORY_CARE_PROVIDER_SITE_OTHER): Payer: Medicare Other | Admitting: Family Medicine

## 2020-02-09 VITALS — BP 128/65 | HR 78 | Ht 67.0 in | Wt 185.0 lb

## 2020-02-09 DIAGNOSIS — M549 Dorsalgia, unspecified: Secondary | ICD-10-CM | POA: Insufficient documentation

## 2020-02-09 DIAGNOSIS — M79675 Pain in left toe(s): Secondary | ICD-10-CM | POA: Diagnosis not present

## 2020-02-09 DIAGNOSIS — L03032 Cellulitis of left toe: Secondary | ICD-10-CM

## 2020-02-09 MED ORDER — PREDNISONE 20 MG PO TABS: 40.0000 mg | ORAL_TABLET | Freq: Every day | ORAL | 0 refills | Status: DC

## 2020-02-09 NOTE — Progress Notes (Signed)
Acute Office Visit  Subjective:    Patient ID: [name], male    DOB: [birth year], 79 y.o.   MRN: [id number]  Chief Complaint  Patient presents with  . Cellulitis  . Back Pain    r sided around kidney area    HPI Patient is in today for follow-up from urgent care when he was seen on September 18, approximately 3 days ago for left second toe of the left foot pain about a week after trimming his nails.  He was diagnosed with paronychia and placed on doxycycline.  He is here today for recheck.  He also c/ of right sided back pain. Rates it 9/10. Worse with movement. Hx of kidney stones.  No hematuria.  Taking Motrin for pain relief.  Not waking him up night.  Motrin does provide relief but as soon as it wears off the pain comes back.  He says that the most like a grabbing sensation especially if he moves just the right way.  He did remember staying with some family in the end of August and he had been carrying some luggage down a narrow hallway.  Past Medical History:  Diagnosis Date  . Arthritis    osteoarthritis right hip  . Carotid artery plaque    mild  . Colon polyps   . Erectile dysfunction   . GERD (gastroesophageal reflux disease)    history- no current problems  . History of kidney stones   . History of nephrolithiasis    episodes x 6   . HLD (hyperlipidemia)   . HTN (hypertension)   . Impaired hearing    bilateral hearing aids  . Sleep apnea    cpap used with nasal clip    Past Surgical History:  Procedure Laterality Date  . cataract Bilateral 2015  . CYSTOSCOPY W/ STONE MANIPULATION     x 2  . HEMORRHOID SURGERY    . INGUINAL HERNIA REPAIR Right   . LAPAROSCOPIC DRAINAGE RENAL CYST    . LITHOTRIPSY     x 1   . TOTAL HIP ARTHROPLASTY Right 11/15/2015   Procedure: RIGHT TOTAL HIP ARTHROPLASTY ANTERIOR APPROACH;  Surgeon: Paralee Cancel, MD;  Location: [facility];  Service: Orthopedics;  Laterality: Right;  . TOTAL HIP ARTHROPLASTY Left 10/22/2017   Procedure:  LEFT TOTAL HIP ARTHROPLASTY ANTERIOR APPROACH;  Surgeon: Paralee Cancel, MD;  Location: [facility];  Service: Orthopedics;  Laterality: Left;    Family History  Problem Relation Age of Onset  . Colon cancer Father   . Heart attack Mother 36  . Diabetes Other        1st degree relative    Social History   Socioeconomic History  . Marital status: Married    Spouse name: [name]   . Number of children: 3  . Years of education: 86  . Highest education level: Master's degree (e.g., MA, MS, MEng, MEd, MSW, MBA)  Occupational History  . Occupation: Retired     Comment: Hydrologist  Tobacco Use  . Smoking status: Never Smoker  . Smokeless tobacco: Never Used  Vaping Use  . Vaping Use: Never used  Substance and Sexual Activity  . Alcohol use: No  . Drug use: No  . Sexual activity: Yes  Other Topics Concern  . Not on file  Social History Narrative   Married.  No regular exercise.  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 He now teaches pottery in [place] Strain:   . Difficulty of Paying Living Expenses: Not on file  Food Insecurity:   . Worried About Charity fundraiser in the Last Year: Not on file  . Ran Out of Food in the Last Year: Not on file  Transportation Needs:   . Lack of Transportation (Medical): Not on file  . Lack of Transportation (Non-Medical): Not on file  Physical Activity:   . Days of Exercise per Week: Not on file  . Minutes of Exercise per Session: Not on file  Stress:   . Feeling of Stress : Not on file  Social Connections:   . Frequency of Communication with Friends and Family: Not on file  . Frequency of Social Gatherings with Friends and Family: Not on file  . Attends Religious Services: Not on file  . Active Member of Clubs or  Organizations: Not on file  . Attends Archivist Meetings: Not on file  . Marital Status: Not on file  Intimate Partner Violence:   . Fear of Current or Ex-Partner: Not on file  . Emotionally Abused: Not on file  . Physically Abused: Not on file  . Sexually Abused: Not on file    Outpatient Medications Prior to Visit  Medication Sig Dispense Refill  . ascorbic acid (VITAMIN C) 1000 MG tablet Take 1,000 mg by mouth daily with supper.     Marland Kitchen aspirin EC 81 MG tablet Take 81 mg by mouth daily.    . Cholecalciferol (VITAMIN D3) 2000 units TABS Take 2,000 Units by mouth at bedtime.     . Coenzyme Q10 (CO Q 10) 100 MG CAPS Take by mouth.    . doxycycline (VIBRA-TABS) 100 MG tablet Take 1 tablet (100 mg total) by mouth 2 (two) times daily. 20 tablet 0  . famotidine (PEPCID) 20 MG tablet Take 1 tablet (20 mg total) by mouth at bedtime. 90 tablet 3  . glucosamine-chondroitin 500-400 MG tablet Take 1 tablet by mouth daily with supper.     . Omega-3 1000 MG CAPS Take 1 tablet by mouth daily.    . pantoprazole (PROTONIX) 40 MG tablet Take by mouth.    . simvastatin (ZOCOR) 80 MG tablet Take 0.5 tablets (40 mg total) by mouth at bedtime. 90 tablet 3  . valsartan-hydrochlorothiazide (DIOVAN-HCT) 80-12.5 MG tablet TAKE 1 TABLET BY MOUTH EVERY DAY 90 tablet 1   No facility-administered medications prior to visit.    Allergies  Allergen Reactions  . Morphine And Related Nausea And Vomiting  . Cialis [Tadalafil] Other (See Comments)    Limb pain    Review of Systems     Objective:    Physical Exam Vitals reviewed.  Constitutional:      Appearance: He is well-developed.  HENT:     Head: Normocephalic and atraumatic.  Eyes:     Conjunctiva/sclera: Conjunctivae normal.  Cardiovascular:     Rate and Rhythm: Normal rate.  Pulmonary:     Effort: Pulmonary effort is normal.  Musculoskeletal:     Comments: Tender over the thoracic or lumbar spine.  Nontender over the paraspinous  muscles or over the right lateral ribs.  Some discomfort with rotation right and left.  Able to flex forward without difficulty.  Patellar reflexes 2+.  Strength in the lower extremities is 5 out of 5.  Second  toe on the left foot is mildly swollen and has a dusky appearance at the distal phalanx.  I do not see any active drainage around the nail itself and he is nontender directly around the nail bed.  Tenderness over the distal phalanx  Of the second toe.  He is able to flex and extend some but it is painful  Skin:    General: Skin is dry.     Coloration: Skin is not pale.  Neurological:     Mental Status: He is alert and oriented to person, place, and time.  Psychiatric:        Behavior: Behavior normal.     BP 128/65   Pulse 78   Ht 5\' 7"  (1.702 m)   Wt 185 lb (83.9 kg)   SpO2 98%   BMI 28.98 kg/m  Wt Readings from Last 3 Encounters:  02/09/20 185 lb (83.9 kg)  12/14/19 182 lb (82.6 kg)  07/28/19 184 lb (83.5 kg)    Health Maintenance Due  Topic Date Due  . INFLUENZA VACCINE  12/20/2019    There are no preventive care reminders to display for this patient.   Lab Results  Component Value Date   TSH 0.92 04/17/2017   Lab Results  Component Value Date   WBC 6.8 12/14/2019   HGB 16.8 12/14/2019   HCT 48.5 12/14/2019   MCV 91.5 12/14/2019   PLT 172 12/14/2019   Lab Results  Component Value Date   NA 141 12/14/2019   K 3.6 12/14/2019   CO2 34 (H) 12/14/2019   GLUCOSE 113 (H) 12/14/2019   BUN 21 12/14/2019   CREATININE 1.22 (H) 12/14/2019   BILITOT 0.6 03/23/2019   ALKPHOS 64 05/06/2016   AST 21 03/23/2019   ALT 22 03/23/2019   PROT 6.7 03/23/2019   ALBUMIN 4.2 10/05/2013   CALCIUM 9.7 12/14/2019   ANIONGAP 11 10/23/2017   GFR 78.93 10/05/2013   Lab Results  Component Value Date   CHOL 140 03/23/2019   Lab Results  Component Value Date   HDL 37 (L) 03/23/2019   Lab Results  Component Value Date   LDLCALC 81 03/23/2019   Lab Results  Component  Value Date   TRIG 128 03/23/2019   Lab Results  Component Value Date   CHOLHDL 3.8 03/23/2019   Lab Results  Component Value Date   HGBA1C 5.0 03/23/2019       Assessment & Plan:   Problem List Items Addressed This Visit      Other   Toe pain, left - Primary    2 actually has a very dusky appearance and has some mild swelling he is actually nontender around the nail border itself and he says it seems responding well to the doxycycline.  I like to get an x-ray at this point just to make sure that there is no underlying fracture.  Also consider other causes of inflammation we will check uric acid sed rate etc.      Relevant Orders   DG Toe 2nd Left   CBC with Differential/Platelet   Uric acid   Sedimentation rate   ANA   Urinalysis, Routine w reflex microscopic   Mid back pain on right side    Worse with movement suspect this is more musculoskeletal in nature could even have a compressed disc in the spinal cord that is causing the pain.  Will get x-ray for further work-up since this has been going on for couple weeks and does not  seem to be improving on its own.  Also like to consider a trial of prednisone to see if this helps but want him to wait until he gets his blood work to start the prednisone.  It is a little lower than the kidney.  He does have a history of kidney stones but pain does not seem consistent with this but will check a urinalysis just to look for hematuria      Relevant Medications   predniSONE (DELTASONE) 20 MG tablet   Other Relevant Orders   CBC with Differential/Platelet   Uric acid   Sedimentation rate   ANA   Urinalysis, Routine w reflex microscopic   DG Lumbar Spine Complete   DG Thoracic Spine 2 View    Other Visit Diagnoses    Paronychia of toe of left foot            Meds ordered this encounter  Medications  . predniSONE (DELTASONE) 20 MG tablet    Sig: Take 2 tablets (40 mg total) by mouth daily with breakfast.    Dispense:  10  tablet    Refill:  0     Beatrice Lecher, MD

## 2020-02-09 NOTE — Assessment & Plan Note (Addendum)
Worse with movement suspect this is more musculoskeletal in nature could even have a compressed disc in the spinal cord that is causing the pain.  Will get x-ray for further work-up since this has been going on for couple weeks and does not seem to be improving on its own.  Also like to consider a trial of prednisone to see if this helps but want him to wait until he gets his blood work to start the prednisone.  It is a little lower than the kidney.  He does have a history of kidney stones but pain does not seem consistent with this but will check a urinalysis just to look for hematuria

## 2020-02-09 NOTE — Assessment & Plan Note (Signed)
2 actually has a very dusky appearance and has some mild swelling he is actually nontender around the nail border itself and he says it seems responding well to the doxycycline.  I like to get an x-ray at this point just to make sure that there is no underlying fracture.  Also consider other causes of inflammation we will check uric acid sed rate etc.

## 2020-02-10 ENCOUNTER — Ambulatory Visit (INDEPENDENT_AMBULATORY_CARE_PROVIDER_SITE_OTHER): Payer: Medicare Other

## 2020-02-10 ENCOUNTER — Other Ambulatory Visit: Payer: Self-pay

## 2020-02-10 DIAGNOSIS — M549 Dorsalgia, unspecified: Secondary | ICD-10-CM | POA: Diagnosis not present

## 2020-02-10 DIAGNOSIS — M5136 Other intervertebral disc degeneration, lumbar region: Secondary | ICD-10-CM | POA: Diagnosis not present

## 2020-02-10 DIAGNOSIS — M2578 Osteophyte, vertebrae: Secondary | ICD-10-CM | POA: Diagnosis not present

## 2020-02-10 DIAGNOSIS — M7989 Other specified soft tissue disorders: Secondary | ICD-10-CM | POA: Diagnosis not present

## 2020-02-10 DIAGNOSIS — I701 Atherosclerosis of renal artery: Secondary | ICD-10-CM | POA: Diagnosis not present

## 2020-02-10 DIAGNOSIS — M79675 Pain in left toe(s): Secondary | ICD-10-CM

## 2020-02-10 DIAGNOSIS — I700 Atherosclerosis of aorta: Secondary | ICD-10-CM | POA: Diagnosis not present

## 2020-02-10 DIAGNOSIS — M4319 Spondylolisthesis, multiple sites in spine: Secondary | ICD-10-CM | POA: Diagnosis not present

## 2020-02-11 ENCOUNTER — Encounter: Payer: Self-pay | Admitting: Family Medicine

## 2020-02-12 ENCOUNTER — Encounter: Payer: Self-pay | Admitting: Family Medicine

## 2020-02-12 DIAGNOSIS — I700 Atherosclerosis of aorta: Secondary | ICD-10-CM | POA: Insufficient documentation

## 2020-02-12 DIAGNOSIS — M4306 Spondylolysis, lumbar region: Secondary | ICD-10-CM | POA: Insufficient documentation

## 2020-02-12 DIAGNOSIS — M5136 Other intervertebral disc degeneration, lumbar region: Secondary | ICD-10-CM | POA: Insufficient documentation

## 2020-02-12 LAB — SEDIMENTATION RATE: Sed Rate: 2 mm/h (ref 0–20)

## 2020-02-12 LAB — URINALYSIS, ROUTINE W REFLEX MICROSCOPIC
Bilirubin Urine: NEGATIVE
Glucose, UA: NEGATIVE
Hgb urine dipstick: NEGATIVE
Ketones, ur: NEGATIVE
Leukocytes,Ua: NEGATIVE
Nitrite: NEGATIVE
Protein, ur: NEGATIVE
Specific Gravity, Urine: 1.018 (ref 1.001–1.03)
pH: <=5 (ref 5.0–8.0)

## 2020-02-12 LAB — CBC WITH DIFFERENTIAL/PLATELET
Absolute Monocytes: 382 cells/uL (ref 200–950)
Basophils Absolute: 47 cells/uL (ref 0–200)
Basophils Relative: 0.7 %
Eosinophils Absolute: 248 cells/uL (ref 15–500)
Eosinophils Relative: 3.7 %
HCT: 47.7 % (ref 38.5–50.0)
Hemoglobin: 16.8 g/dL (ref 13.2–17.1)
Lymphs Abs: 1662 cells/uL (ref 850–3900)
MCH: 31.8 pg (ref 27.0–33.0)
MCHC: 35.2 g/dL (ref 32.0–36.0)
MCV: 90.3 fL (ref 80.0–100.0)
MPV: 10 fL (ref 7.5–12.5)
Monocytes Relative: 5.7 %
Neutro Abs: 4362 cells/uL (ref 1500–7800)
Neutrophils Relative %: 65.1 %
Platelets: 169 10*3/uL (ref 140–400)
RBC: 5.28 10*6/uL (ref 4.20–5.80)
RDW: 12.9 % (ref 11.0–15.0)
Total Lymphocyte: 24.8 %
WBC: 6.7 10*3/uL (ref 3.8–10.8)

## 2020-02-12 LAB — ANA: Anti Nuclear Antibody (ANA): NEGATIVE

## 2020-02-12 LAB — URIC ACID: Uric Acid, Serum: 8.3 mg/dL — ABNORMAL HIGH (ref 4.0–8.0)

## 2020-02-12 NOTE — Telephone Encounter (Signed)
Questions answered under result notes.

## 2020-02-14 ENCOUNTER — Encounter: Payer: Self-pay | Admitting: Family Medicine

## 2020-03-21 ENCOUNTER — Other Ambulatory Visit: Payer: Self-pay

## 2020-03-21 ENCOUNTER — Ambulatory Visit: Payer: Medicare Other | Admitting: Family Medicine

## 2020-03-21 ENCOUNTER — Ambulatory Visit (INDEPENDENT_AMBULATORY_CARE_PROVIDER_SITE_OTHER): Payer: Medicare Other | Admitting: Family Medicine

## 2020-03-21 DIAGNOSIS — Z23 Encounter for immunization: Secondary | ICD-10-CM

## 2020-03-23 ENCOUNTER — Other Ambulatory Visit: Payer: Self-pay | Admitting: Family Medicine

## 2020-03-23 DIAGNOSIS — K2100 Gastro-esophageal reflux disease with esophagitis, without bleeding: Secondary | ICD-10-CM

## 2020-03-24 ENCOUNTER — Other Ambulatory Visit: Payer: Self-pay | Admitting: Family Medicine

## 2020-04-29 DIAGNOSIS — M25511 Pain in right shoulder: Secondary | ICD-10-CM | POA: Diagnosis not present

## 2020-05-19 ENCOUNTER — Other Ambulatory Visit: Payer: Self-pay

## 2020-05-19 ENCOUNTER — Emergency Department (INDEPENDENT_AMBULATORY_CARE_PROVIDER_SITE_OTHER)
Admission: EM | Admit: 2020-05-19 | Discharge: 2020-05-19 | Disposition: A | Discharge status: Home / Self Care | Payer: Medicare Other | Source: Home / Self Care

## 2020-05-19 ENCOUNTER — Emergency Department (INDEPENDENT_AMBULATORY_CARE_PROVIDER_SITE_OTHER): Payer: Medicare Other

## 2020-05-19 DIAGNOSIS — J028 Acute pharyngitis due to other specified organisms: Secondary | ICD-10-CM

## 2020-05-19 DIAGNOSIS — J069 Acute upper respiratory infection, unspecified: Secondary | ICD-10-CM | POA: Diagnosis not present

## 2020-05-19 DIAGNOSIS — R059 Cough, unspecified: Secondary | ICD-10-CM

## 2020-05-19 DIAGNOSIS — J029 Acute pharyngitis, unspecified: Secondary | ICD-10-CM | POA: Diagnosis not present

## 2020-05-19 DIAGNOSIS — R509 Fever, unspecified: Secondary | ICD-10-CM | POA: Diagnosis not present

## 2020-05-19 LAB — POCT RAPID STREP A (OFFICE): Rapid Strep A Screen: NEGATIVE

## 2020-05-19 MED ORDER — AMOXICILLIN-POT CLAVULANATE 875-125 MG PO TABS: 1.0000 | ORAL_TABLET | Freq: Two times a day (BID) | ORAL | 0 refills | Status: DC

## 2020-05-19 MED ORDER — PREDNISONE 20 MG PO TABS: ORAL_TABLET | ORAL | 0 refills | Status: DC

## 2020-05-19 MED ORDER — BENZONATATE 100 MG PO CAPS: 100.0000 mg | ORAL_CAPSULE | Freq: Three times a day (TID) | ORAL | 0 refills | Status: DC

## 2020-05-19 NOTE — Discharge Instructions (Signed)
  Your symptoms are likely due to a viral illness, exacerbating your underlying lung inflammation seen today as well as in a chest x-ray from 2017.  It is recommended you try the tessalon and prednisone but if not improving or you develop fever over 100.4*F, vomiting or worsening symptoms, start the antibiotic as prescribed. Due to the lung findings from 2017 and today, it is recommended you follow up with primary care as you may need additional lung imaging or pulmonary function testing.   Call 911 or have someone drive you to the hospital if symptoms significantly worsening.

## 2020-05-19 NOTE — ED Triage Notes (Signed)
Pt presents to Urgent Care with c/o cough x several days. Wife w/ upper respiratory infection (negative for COVID and flu). Pt took a home COVID test, which was negative. Pt vaccinated against COVID and flu.

## 2020-05-19 NOTE — ED Provider Notes (Signed)
[facility]    CSN: [id number] Arrival date & time: 05/19/20  1509      History   Chief Complaint Chief Complaint  Patient presents with  . Cough  . Fever    HPI [name] is a 79 y.o. male.   HPI  [name] is a 79 y.o. male presenting to UC with c/o mild sore throat, hoarse voice, congestion and "deep" cough for 3 days.  Pt had a negative COVID test on Christmas as requested by family members coming to visit. Pt was asymptomatic at that time.  Pt states wife has been sick for 2.5 weeks, felt better after two rounds of antibiotics including Augmentin, prednisone and tessalon, which pt is requesting this evening.  Pt's wife tested negative for COVID, flu and strep. Pt is vaccinated for COVID. Denies fever, chills, chest pain or SOB. No n/v/d.    Past Medical History:  Diagnosis Date  . Arthritis    osteoarthritis right hip  . Carotid artery plaque    mild  . Colon polyps   . Erectile dysfunction   . GERD (gastroesophageal reflux disease)    history- no current problems  . History of kidney stones   . History of nephrolithiasis    episodes x 6   . HLD (hyperlipidemia)   . HTN (hypertension)   . Impaired hearing    bilateral hearing aids  . Sleep apnea    cpap used with nasal clip    Patient Active Problem List   Diagnosis Date Noted  . Aortic atherosclerosis (HCC) 02/12/2020  . DDD (degenerative disc disease), lumbar 02/12/2020  . Lumbar pars defect - chronic L5 pars fracture 02/12/2020  . Toe pain, left 02/09/2020  . Mid back pain on right side 02/09/2020  . Asymmetrical hearing loss, bilateral 08/21/2019  . Meniere disease, left 08/21/2019  . Diverticulosis 04/26/2019  . Gastroesophageal reflux disease with esophagitis without hemorrhage 03/20/2019  . S/P left THA, AA 10/22/2017  . Osteoarthritis of left hip 08/21/2017  . Osteoarthritis of knee 06/10/2017  . Pain in left knee 06/10/2017  . ED (erectile dysfunction) 04/17/2017  .  S/P right THA, AA 11/15/2015  . Cough variant asthma 04/03/2015  . Upper airway cough syndrome 03/10/2015  . Groin strain-right 11/17/2013  . HYPERLIPIDEMIA 02/12/2007  . Essential hypertension 02/12/2007  . COLONIC POLYPS, HX OF 02/12/2007  . NEPHROLITHIASIS, HX OF 02/12/2007    Past Surgical History:  Procedure Laterality Date  . cataract Bilateral 2015  . CYSTOSCOPY W/ STONE MANIPULATION     x 2  . HEMORRHOID SURGERY    . INGUINAL HERNIA REPAIR Right   . LAPAROSCOPIC DRAINAGE RENAL CYST    . LITHOTRIPSY     x 1   . TOTAL HIP ARTHROPLASTY Right 11/15/2015   Procedure: RIGHT TOTAL HIP ARTHROPLASTY ANTERIOR APPROACH;  Surgeon: Durene Romans, MD;  Location: [facility];  Service: Orthopedics;  Laterality: Right;  . TOTAL HIP ARTHROPLASTY Left 10/22/2017   Procedure: LEFT TOTAL HIP ARTHROPLASTY ANTERIOR APPROACH;  Surgeon: Durene Romans, MD;  Location: [facility];  Service: Orthopedics;  Laterality: Left;       Home Medications    Prior to Admission medications   Medication Sig Start Date End Date Taking? Authorizing Provider  amoxicillin-clavulanate (AUGMENTIN) 875-125 MG tablet Take 1 tablet by mouth 2 (two) times daily. One po bid x 7 days 05/19/20  Yes Raunak Antuna O, PA-C  benzonatate (TESSALON) 100 MG capsule Take 1 capsule (100 mg total)  by mouth every 8 (eight) hours. 05/19/20  Yes Kaycee Haycraft, Bronwen Betters, PA-C  predniSONE (DELTASONE) 20 MG tablet 3 tabs po day one, then 2 po daily x 4 days 05/19/20  Yes Audery Wassenaar O, PA-C  ascorbic acid (VITAMIN C) 1000 MG tablet Take 1,000 mg by mouth daily with supper.     [provider]  aspirin EC 81 MG tablet Take 81 mg by mouth daily.    [provider]  Cholecalciferol (VITAMIN D3) 2000 units TABS Take 2,000 Units by mouth at bedtime.     [provider]  Coenzyme Q10 (CO Q 10) 100 MG CAPS Take by mouth.    [provider]  famotidine (PEPCID) 20 MG tablet TAKE 1 TABLET BY MOUTH EVERYDAY AT BEDTIME 03/23/20    Hali Marry, MD  glucosamine-chondroitin 500-400 MG tablet Take 1 tablet by mouth daily with supper.     [provider]  Omega-3 1000 MG CAPS Take 1 tablet by mouth daily.    [provider]  pantoprazole (PROTONIX) 40 MG tablet Take by mouth.    [provider]  simvastatin (ZOCOR) 80 MG tablet Take 0.5 tablets (40 mg total) by mouth at bedtime. Needs labs 03/24/20   Hali Marry, MD  valsartan-hydrochlorothiazide (DIOVAN-HCT) 80-12.5 MG tablet TAKE 1 TABLET BY MOUTH EVERY DAY 03/24/20   Hali Marry, MD    Family History Family History  Problem Relation Age of Onset  . Colon cancer Father   . Heart attack Mother 44  . Diabetes Other        1st degree relative    Social History Social History   Tobacco Use  . Smoking status: Never Smoker  . Smokeless tobacco: Never Used  Vaping Use  . Vaping Use: Never used  Substance Use Topics  . Alcohol use: No  . Drug use: No     Allergies   Morphine and related and Cialis [tadalafil]   Review of Systems Review of Systems  Constitutional: Negative for chills and fever.  HENT: Positive for congestion, sore throat and voice change. Negative for ear pain and trouble swallowing.   Respiratory: Positive for cough. Negative for shortness of breath.   Cardiovascular: Negative for chest pain and palpitations.  Gastrointestinal: Negative for abdominal pain, diarrhea, nausea and vomiting.  Musculoskeletal: Negative for arthralgias, back pain and myalgias.  Skin: Negative for rash.  All other systems reviewed and are negative.    Physical Exam Triage Vital Signs ED Triage Vitals  Enc Vitals Group     BP 05/19/20 1703 121/72     Pulse Rate 05/19/20 1703 86     Resp 05/19/20 1703 18     Temp 05/19/20 1703 99.2 F (37.3 C)     Temp Source 05/19/20 1703 Oral     SpO2 05/19/20 1703 98 %     Weight 05/19/20 1704 175 lb (79.4 kg)     Height 05/19/20 1704 5\' 6"  (1.676 m)     Head  Circumference --      Peak Flow --      Pain Score 05/19/20 1704 0     Pain Loc --      Pain Edu? --      Excl. in [place]? --    No data found.  Updated Vital Signs BP 121/72 (BP Location: Right Arm)   Pulse 86   Temp 99.2 F (37.3 C) (Oral)   Resp 18   Ht 5\' 6"  (1.676 m)  Wt 175 lb (79.4 kg)   SpO2 98%   BMI 28.25 kg/m   Visual Acuity Right Eye Distance:   Left Eye Distance:   Bilateral Distance:    Right Eye Near:   Left Eye Near:    Bilateral Near:     Physical Exam Vitals and nursing note reviewed.  Constitutional:      General: He is not in acute distress.    Appearance: Normal appearance. He is well-developed and well-nourished. He is not ill-appearing, toxic-appearing or diaphoretic.  HENT:     Head: Normocephalic and atraumatic.     Right Ear: Tympanic membrane and ear canal normal.     Left Ear: Tympanic membrane and ear canal normal.     Nose: Nose normal.     Right Sinus: No maxillary sinus tenderness or frontal sinus tenderness.     Left Sinus: No maxillary sinus tenderness or frontal sinus tenderness.     Mouth/Throat:     Lips: Pink.     Mouth: Mucous membranes are moist.     Pharynx: Oropharynx is clear. Uvula midline. Posterior oropharyngeal erythema present. No pharyngeal swelling, oropharyngeal exudate or uvula swelling.     Tonsils: No tonsillar exudate.  Eyes:     Extraocular Movements: EOM normal.  Cardiovascular:     Rate and Rhythm: Normal rate and regular rhythm.  Pulmonary:     Effort: Pulmonary effort is normal. No respiratory distress.     Breath sounds: Normal breath sounds. No stridor. No wheezing, rhonchi or rales.  Musculoskeletal:        General: Normal range of motion.     Cervical back: Normal range of motion and neck supple.  Skin:    General: Skin is warm and dry.  Neurological:     Mental Status: He is alert and oriented to person, place, and time.  Psychiatric:        Mood and Affect: Mood and affect normal.         Behavior: Behavior normal.      UC Treatments / Results  Labs (all labs ordered are listed, but only abnormal results are displayed) Labs Reviewed  COVID-19, FLU A+B AND RSV  CULTURE, GROUP A STREP  POCT RAPID STREP A (OFFICE)    EKG   Radiology DG Chest 2 View  Result Date: 05/19/2020 CLINICAL DATA:  Cough, congestion, sore throat, and low-grade fever EXAM: CHEST - 2 VIEW COMPARISON:  05/19/2016 FINDINGS: Heart size and pulmonary vascularity are normal. Linear atelectasis or fibrosis in the lung bases, similar to prior study. No acute consolidation or airspace disease is suggested. No pleural effusions. No pneumothorax. Mediastinal contours appear intact. Calcification of the aorta. Degenerative changes in the spine and shoulders. IMPRESSION: Linear atelectasis or fibrosis in the lung bases. Electronically Signed   By: Lucienne Capers M.D.   On: 05/19/2020 18:40    Procedures Procedures (including critical care time)  Medications Ordered in UC Medications - No data to display  Initial Impression / Assessment and Plan / UC Course  I have reviewed the triage vital signs and the nursing notes.  Pertinent labs & imaging results that were available during my care of the patient were reviewed by me and considered in my medical decision making (see chart for details).     Rapid strep: NEGATIVE Discussed CXR with pt, findings of inflammation similar to prior findings in 2017 COVID/Flu/RSV test pending Encouraged symptomatic tx first with prednisone and tessalon Prescription to hold given for augmentin Encouraged  f/u with PCP next week as needed  Final Clinical Impressions(s) / UC Diagnoses   Final diagnoses:  Acute upper respiratory infection  Acute pharyngitis, unspecified etiology     Discharge Instructions      Your symptoms are likely due to a viral illness, exacerbating your underlying lung inflammation seen today as well as in a chest x-ray from 2017.  It is  recommended you try the tessalon and prednisone but if not improving or you develop fever over 100.4*F, vomiting or worsening symptoms, start the antibiotic as prescribed. Due to the lung findings from 2017 and today, it is recommended you follow up with primary care as you may need additional lung imaging or pulmonary function testing.   Call 911 or have someone drive you to the hospital if symptoms significantly worsening.      ED Prescriptions    Medication Sig Dispense Auth. Provider   benzonatate (TESSALON) 100 MG capsule Take 1 capsule (100 mg total) by mouth every 8 (eight) hours. 21 capsule Leeroy Cha O, PA-C   amoxicillin-clavulanate (AUGMENTIN) 875-125 MG tablet Take 1 tablet by mouth 2 (two) times daily. One po bid x 7 days 14 tablet Parminder Cupples O, PA-C   predniSONE (DELTASONE) 20 MG tablet 3 tabs po day one, then 2 po daily x 4 days 11 tablet Noe Gens, [place]     PDMP not reviewed this encounter.   Noe Gens, PA-C 05/19/20 2015

## 2020-05-23 LAB — COVID-19, FLU A+B AND RSV
Influenza A, NAA: NOT DETECTED
Influenza B, NAA: NOT DETECTED
RSV, NAA: NOT DETECTED
SARS-CoV-2, NAA: NOT DETECTED

## 2020-05-23 LAB — CULTURE, GROUP A STREP: Strep A Culture: NEGATIVE

## 2020-06-01 DIAGNOSIS — Z125 Encounter for screening for malignant neoplasm of prostate: Secondary | ICD-10-CM | POA: Diagnosis not present

## 2020-06-15 ENCOUNTER — Other Ambulatory Visit: Payer: Self-pay | Admitting: Family Medicine

## 2020-06-15 ENCOUNTER — Encounter: Payer: Self-pay | Admitting: Family Medicine

## 2020-06-15 ENCOUNTER — Ambulatory Visit (INDEPENDENT_AMBULATORY_CARE_PROVIDER_SITE_OTHER): Payer: Medicare Other | Admitting: Family Medicine

## 2020-06-15 ENCOUNTER — Other Ambulatory Visit: Payer: Self-pay

## 2020-06-15 VITALS — BP 138/75 | HR 73 | Ht 66.0 in | Wt 184.0 lb

## 2020-06-15 DIAGNOSIS — I10 Essential (primary) hypertension: Secondary | ICD-10-CM

## 2020-06-15 DIAGNOSIS — I700 Atherosclerosis of aorta: Secondary | ICD-10-CM

## 2020-06-15 DIAGNOSIS — R972 Elevated prostate specific antigen [PSA]: Secondary | ICD-10-CM | POA: Diagnosis not present

## 2020-06-15 NOTE — Assessment & Plan Note (Signed)
Well controlled. Continue current regimen. Follow up in  6 months.  

## 2020-06-15 NOTE — Assessment & Plan Note (Signed)
No recent CP or SOB. Continue statin. Due for labs.

## 2020-06-15 NOTE — Progress Notes (Signed)
Established Patient Office Visit  Subjective:  Patient ID: [name], male    DOB: [birth year]  Age: 80 y.o. MRN: [id number]  CC: No chief complaint on file.   HPI [name] presents for   Hypertension- Pt denies chest pain, SOB, dizziness, or heart palpitations.  Taking meds as directed w/o problems.  Denies medication side effects.    Aortic atherosclerosis - he wanted to discuss his statin. He has been tolerating it well but wants to know if needs to continue it.   He also wanted to let me know he had his yearly checkup with urology and was noted to have an elevated PSA.  About 2 years ago PSA was around 2.5 and when they checked at this time it was 5.1.  The initial recommendation was to recheck the level again in about 6 months.  He did have a digital rectal exam which was unrevealing.  He started to get a little nervous about it and wanted to have a biopsy done so he has scheduled that at the end of February.  He says he actually had a biopsy years ago for an elevated PSA and had a normal result and the PSA eventually resolved.  At that time they felt like it was likely secondary to an infection.  He has not been having any acute ill symptoms.  Past Medical History:  Diagnosis Date  . Arthritis    osteoarthritis right hip  . Carotid artery plaque    mild  . Colon polyps   . Erectile dysfunction   . GERD (gastroesophageal reflux disease)    history- no current problems  . History of kidney stones   . History of nephrolithiasis    episodes x 6   . HLD (hyperlipidemia)   . HTN (hypertension)   . Impaired hearing    bilateral hearing aids  . Sleep apnea    cpap used with nasal clip    Past Surgical History:  Procedure Laterality Date  . cataract Bilateral 2015  . CYSTOSCOPY W/ STONE MANIPULATION     x 2  . HEMORRHOID SURGERY    . INGUINAL HERNIA REPAIR Right   . LAPAROSCOPIC DRAINAGE RENAL CYST    . LITHOTRIPSY     x 1   . TOTAL HIP ARTHROPLASTY Right  11/15/2015   Procedure: RIGHT TOTAL HIP ARTHROPLASTY ANTERIOR APPROACH;  Surgeon: Paralee Cancel, MD;  Location: [facility];  Service: Orthopedics;  Laterality: Right;  . TOTAL HIP ARTHROPLASTY Left 10/22/2017   Procedure: LEFT TOTAL HIP ARTHROPLASTY ANTERIOR APPROACH;  Surgeon: Paralee Cancel, MD;  Location: [facility];  Service: Orthopedics;  Laterality: Left;    Family History  Problem Relation Age of Onset  . Colon cancer Father   . Heart attack Mother 34  . Diabetes Other        1st degree relative    Social History   Socioeconomic History  . Marital status: Married    Spouse name: [name]   . Number of children: 3  . Years of education: 68  . Highest education level: Master's degree (e.g., MA, MS, MEng, MEd, MSW, MBA)  Occupational History  . Occupation: Retired     Comment: Hydrologist  Tobacco Use  . Smoking status: Never Smoker  . Smokeless tobacco: Never Used  Vaping Use  . Vaping Use: Never used  Substance and Sexual Activity  . Alcohol use: No  . Drug use: No  . Sexual activity: Yes  Other Topics  Concern  . Not on file  Social History Narrative   Married.  No regular exercise.  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 ascorbic acid (VITAMIN C) 1000 MG tablet Take 1,000 mg by mouth daily with supper.     Marland Kitchen aspirin EC 81 MG tablet Take 81 mg by mouth daily.    . Cholecalciferol (VITAMIN D3) 2000 units TABS Take 2,000 Units by mouth at  bedtime.     . Coenzyme Q10 (CO Q 10) 100 MG CAPS Take by mouth.    . famotidine (PEPCID) 20 MG tablet TAKE 1 TABLET BY MOUTH EVERYDAY AT BEDTIME 90 tablet 2  . glucosamine-chondroitin 500-400 MG tablet Take 1 tablet by mouth daily with supper.     . Omega-3 1000 MG CAPS Take 1 tablet by mouth daily.    . pantoprazole (PROTONIX) 40 MG tablet Take by mouth.    . simvastatin (ZOCOR) 80 MG tablet Take 0.5 tablets (40 mg total) by mouth at bedtime. Needs labs 45 tablet 0  . valsartan-hydrochlorothiazide (DIOVAN-HCT) 80-12.5 MG tablet TAKE 1 TABLET BY MOUTH EVERY DAY 90 tablet 1  . amoxicillin-clavulanate (AUGMENTIN) 875-125 MG tablet Take 1 tablet by mouth 2 (two) times daily. One po bid x 7 days 14 tablet 0  . benzonatate (TESSALON) 100 MG capsule Take 1 capsule (100 mg total) by mouth every 8 (eight) hours. 21 capsule 0  . predniSONE (DELTASONE) 20 MG tablet 3 tabs po day one, then 2 po daily x 4 days 11 tablet 0   No facility-administered medications prior to visit.    Allergies  Allergen Reactions  . Morphine And Related Nausea And Vomiting  . Cialis [Tadalafil] Other (See Comments)    Limb pain    ROS Review of Systems    Objective:    Physical Exam Constitutional:      Appearance: He is well-developed and well-nourished.  HENT:     Head: Normocephalic and atraumatic.  Cardiovascular:     Rate and Rhythm: Normal rate and regular rhythm.     Heart sounds: Normal heart sounds.  Pulmonary:     Effort: Pulmonary effort is normal.     Breath sounds: Normal breath sounds.  Skin:    General: Skin is warm and dry.  Neurological:     Mental Status: He is alert and oriented to person, place, and time.  Psychiatric:        Mood and Affect: Mood and affect normal.        Behavior: Behavior normal.     BP 138/75   Pulse 73   Ht 5\' 6"  (1.676 m)   Wt 184 lb (83.5 kg)   BMI 29.70 kg/m  Wt Readings from Last 3 Encounters:  06/15/20 184 lb (83.5 kg)  05/19/20 175 lb (79.4 kg)   02/09/20 185 lb (83.9 kg)     Health Maintenance Due  Topic Date Due  . COVID-19 Vaccine (3 -  Booster for [employer] series) 01/22/2020    There are no preventive care reminders to display for this patient.  Lab Results  Component Value Date   TSH 0.92 04/17/2017   Lab Results  Component Value Date   WBC 6.7 02/10/2020   HGB 16.8 02/10/2020   HCT 47.7 02/10/2020   MCV 90.3 02/10/2020   PLT 169 02/10/2020   Lab Results  Component Value Date   NA 141 12/14/2019   K 3.6 12/14/2019   CO2 34 (H) 12/14/2019   GLUCOSE 113 (H) 12/14/2019   BUN 21 12/14/2019   CREATININE 1.22 (H) 12/14/2019   BILITOT 0.6 03/23/2019   ALKPHOS 64 05/06/2016   AST 21 03/23/2019   ALT 22 03/23/2019   PROT 6.7 03/23/2019   ALBUMIN 4.2 10/05/2013   CALCIUM 9.7 12/14/2019   ANIONGAP 11 10/23/2017   GFR 78.93 10/05/2013   Lab Results  Component Value Date   CHOL 140 03/23/2019   Lab Results  Component Value Date   HDL 37 (L) 03/23/2019   Lab Results  Component Value Date   LDLCALC 81 03/23/2019   Lab Results  Component Value Date   TRIG 128 03/23/2019   Lab Results  Component Value Date   CHOLHDL 3.8 03/23/2019   Lab Results  Component Value Date   HGBA1C 5.0 03/23/2019      Assessment & Plan:   Problem List Items Addressed This Visit      Cardiovascular and Mediastinum   Essential hypertension - Primary    Well controlled. Continue current regimen. Follow up in  6 months.       Relevant Orders   COMPLETE METABOLIC PANEL WITH GFR   Lipid panel   CBC   Aortic atherosclerosis (HCC)    No recent CP or SOB. Continue statin. Due for labs.       Relevant Orders   COMPLETE METABOLIC PANEL WITH GFR   Lipid panel   CBC    Other Visit Diagnoses    Elevated PSA          Elevated PSA-did discuss elevated PSA and that his elevation is considered mild.  And that it is perfectly reasonable to wait at least 2 to 3 months to recheck that PSA to see if it is trending upward or  at least is stable before considering biopsy.  I can understand him being a little anxious about waiting 6 months but it is also not unreasonable we discussed that oftentimes prostate cancer is very slow-growing and that there are other reasons that the PSA can falsely elevate.  It does not sound like he has any active infection that should be causing this.  I suspect his PSA will either remain stable or might even go back down.  But certainly if it is trending upward then I think a biopsy would be recommended.  No orders of the defined types were placed in this encounter.   Follow-up: Return in about 6 months (around 12/13/2020) for Hypertension.    Beatrice Lecher, MD

## 2020-06-16 DIAGNOSIS — I700 Atherosclerosis of aorta: Secondary | ICD-10-CM | POA: Diagnosis not present

## 2020-06-16 DIAGNOSIS — I10 Essential (primary) hypertension: Secondary | ICD-10-CM | POA: Diagnosis not present

## 2020-06-16 LAB — COMPLETE METABOLIC PANEL WITH GFR
AG Ratio: 1.8 (calc) (ref 1.0–2.5)
ALT: 33 U/L (ref 9–46)
AST: 25 U/L (ref 10–35)
Albumin: 4.3 g/dL (ref 3.6–5.1)
Alkaline phosphatase (APISO): 52 U/L (ref 35–144)
BUN: 16 mg/dL (ref 7–25)
CO2: 36 mmol/L — ABNORMAL HIGH (ref 20–32)
Calcium: 9.6 mg/dL (ref 8.6–10.3)
Chloride: 97 mmol/L — ABNORMAL LOW (ref 98–110)
Creat: 1.09 mg/dL (ref 0.70–1.18)
GFR, Est African American: 75 mL/min/{1.73_m2} (ref >=60)
GFR, Est Non African American: 65 mL/min/{1.73_m2} (ref >=60)
Globulin: 2.4 g/dL (calc) (ref 1.9–3.7)
Glucose, Bld: 102 mg/dL — ABNORMAL HIGH (ref 65–99)
Potassium: 3.9 mmol/L (ref 3.5–5.3)
Sodium: 141 mmol/L (ref 135–146)
Total Bilirubin: 0.9 mg/dL (ref 0.2–1.2)
Total Protein: 6.7 g/dL (ref 6.1–8.1)

## 2020-06-16 LAB — LIPID PANEL
Cholesterol: 167 mg/dL (ref <200)
HDL: 37 mg/dL — ABNORMAL LOW (ref >=40)
LDL Cholesterol (Calc): 93 mg/dL (calc)
Non-HDL Cholesterol (Calc): 130 mg/dL (calc) — ABNORMAL HIGH (ref <130)
Total CHOL/HDL Ratio: 4.5 (calc) (ref <5.0)
Triglycerides: 243 mg/dL — ABNORMAL HIGH (ref <150)

## 2020-06-16 LAB — CBC
HCT: 48.1 % (ref 38.5–50.0)
Hemoglobin: 16.6 g/dL (ref 13.2–17.1)
MCH: 31 pg (ref 27.0–33.0)
MCHC: 34.5 g/dL (ref 32.0–36.0)
MCV: 89.9 fL (ref 80.0–100.0)
MPV: 10 fL (ref 7.5–12.5)
Platelets: 168 10*3/uL (ref 140–400)
RBC: 5.35 10*6/uL (ref 4.20–5.80)
RDW: 13.3 % (ref 11.0–15.0)
WBC: 5.6 10*3/uL (ref 3.8–10.8)

## 2020-06-25 ENCOUNTER — Encounter: Payer: Self-pay | Admitting: Family Medicine

## 2020-06-25 DIAGNOSIS — R9389 Abnormal findings on diagnostic imaging of other specified body structures: Secondary | ICD-10-CM

## 2020-07-01 NOTE — Addendum Note (Signed)
Addended by: Beatrice Lecher D on: 07/01/2020 09:49 AM   Modules accepted: Orders

## 2020-07-01 NOTE — Telephone Encounter (Signed)
CT ordered. 

## 2020-07-04 ENCOUNTER — Other Ambulatory Visit: Payer: Self-pay

## 2020-07-04 ENCOUNTER — Ambulatory Visit (INDEPENDENT_AMBULATORY_CARE_PROVIDER_SITE_OTHER): Payer: Medicare Other

## 2020-07-04 DIAGNOSIS — I700 Atherosclerosis of aorta: Secondary | ICD-10-CM

## 2020-07-04 DIAGNOSIS — R9389 Abnormal findings on diagnostic imaging of other specified body structures: Secondary | ICD-10-CM

## 2020-07-04 DIAGNOSIS — I2510 Atherosclerotic heart disease of native coronary artery without angina pectoris: Secondary | ICD-10-CM | POA: Diagnosis not present

## 2020-07-04 DIAGNOSIS — E041 Nontoxic single thyroid nodule: Secondary | ICD-10-CM

## 2020-07-04 DIAGNOSIS — J9811 Atelectasis: Secondary | ICD-10-CM | POA: Diagnosis not present

## 2020-07-05 ENCOUNTER — Encounter: Payer: Self-pay | Admitting: Family Medicine

## 2020-07-05 DIAGNOSIS — I700 Atherosclerosis of aorta: Secondary | ICD-10-CM

## 2020-07-05 DIAGNOSIS — E041 Nontoxic single thyroid nodule: Secondary | ICD-10-CM

## 2020-07-06 MED ORDER — ATORVASTATIN CALCIUM 40 MG PO TABS: 40.0000 mg | ORAL_TABLET | Freq: Every day | ORAL | 1 refills | Status: DC

## 2020-07-06 NOTE — Telephone Encounter (Signed)
Med and [place] ordered

## 2020-07-11 ENCOUNTER — Encounter: Payer: Self-pay | Admitting: Family Medicine

## 2020-07-11 ENCOUNTER — Ambulatory Visit (INDEPENDENT_AMBULATORY_CARE_PROVIDER_SITE_OTHER): Payer: Medicare Other

## 2020-07-11 ENCOUNTER — Other Ambulatory Visit: Payer: Self-pay

## 2020-07-11 DIAGNOSIS — I700 Atherosclerosis of aorta: Secondary | ICD-10-CM

## 2020-07-11 DIAGNOSIS — E042 Nontoxic multinodular goiter: Secondary | ICD-10-CM | POA: Diagnosis not present

## 2020-07-11 DIAGNOSIS — E041 Nontoxic single thyroid nodule: Secondary | ICD-10-CM

## 2020-07-12 ENCOUNTER — Other Ambulatory Visit: Payer: Self-pay | Admitting: Family Medicine

## 2020-07-12 DIAGNOSIS — E041 Nontoxic single thyroid nodule: Secondary | ICD-10-CM

## 2020-07-18 ENCOUNTER — Encounter: Payer: Self-pay | Admitting: Family Medicine

## 2020-07-18 ENCOUNTER — Telehealth: Payer: Self-pay | Admitting: Family Medicine

## 2020-07-18 NOTE — Telephone Encounter (Signed)
Okay to biopsy #1 and #4 as per recommendation for most suspicious.

## 2020-07-18 NOTE — Telephone Encounter (Signed)
Dr. Lonna Cobb from [facility] called and stated the Radiologist needed to know which nodules you were requesting biopsies on. He stated there are 4 that meet criteria but they can only biopsy 2 at a time and 1 and 4 are the most suspicious. Please advise her number is [phone] - [facility]

## 2020-07-19 ENCOUNTER — Other Ambulatory Visit: Payer: Self-pay | Admitting: Family Medicine

## 2020-07-19 DIAGNOSIS — E041 Nontoxic single thyroid nodule: Secondary | ICD-10-CM

## 2020-07-19 NOTE — Telephone Encounter (Signed)
I called [name] back to let her know she is going to put that in an order to let the radiologist know and send back to you for cosign - [facility]

## 2020-07-19 NOTE — Telephone Encounter (Signed)
  He is doing well.  Cardiology referral placed.  PSA follow-up can be week with me or urology either is fine

## 2020-07-20 ENCOUNTER — Other Ambulatory Visit: Payer: Self-pay | Admitting: Radiation Oncology

## 2020-08-04 ENCOUNTER — Ambulatory Visit
Admission: RE | Admit: 2020-08-04 | Discharge: 2020-08-04 | Disposition: A | Discharge status: Home / Self Care | Payer: Medicare Other | Source: Ambulatory Visit | Attending: Family Medicine | Admitting: Family Medicine

## 2020-08-04 ENCOUNTER — Other Ambulatory Visit (HOSPITAL_COMMUNITY)
Admission: RE | Admit: 2020-08-04 | Discharge: 2020-08-04 | Disposition: A | Discharge status: Home / Self Care | Payer: Medicare Other | Source: Ambulatory Visit | Attending: Family Medicine | Admitting: Family Medicine

## 2020-08-04 DIAGNOSIS — E041 Nontoxic single thyroid nodule: Secondary | ICD-10-CM

## 2020-08-04 DIAGNOSIS — E042 Nontoxic multinodular goiter: Secondary | ICD-10-CM | POA: Diagnosis not present

## 2020-08-04 NOTE — Procedures (Signed)
PROCEDURE SUMMARY:  Using direct ultrasound guidance, 5 passes were made into the nodule within the right superior lobe of the thyroid and  5 passes were made  into the nodule within the left inferior lobe of the thyroid using 25 g needles.  Ultrasound was used to confirm needle placements on all occasions.   EBL = trace  Specimens were sent to Pathology for analysis.  See procedure note under Imaging tab in Epic for full procedure details.  Judie Grieve Ramaya Guile PA-C 08/04/2020 3:58 PM

## 2020-08-05 LAB — CYTOLOGY - NON PAP

## 2020-08-14 ENCOUNTER — Encounter: Payer: Self-pay | Admitting: Family Medicine

## 2020-08-14 DIAGNOSIS — R972 Elevated prostate specific antigen [PSA]: Secondary | ICD-10-CM

## 2020-08-14 DIAGNOSIS — Z125 Encounter for screening for malignant neoplasm of prostate: Secondary | ICD-10-CM

## 2020-08-15 NOTE — Telephone Encounter (Signed)
Yes that is fine, order it under Dr. Gardiner Ramus names so that she can manage the results.

## 2020-08-16 ENCOUNTER — Other Ambulatory Visit: Payer: Self-pay

## 2020-08-16 DIAGNOSIS — Z125 Encounter for screening for malignant neoplasm of prostate: Secondary | ICD-10-CM

## 2020-08-22 DIAGNOSIS — R972 Elevated prostate specific antigen [PSA]: Secondary | ICD-10-CM | POA: Diagnosis not present

## 2020-08-22 DIAGNOSIS — Z125 Encounter for screening for malignant neoplasm of prostate: Secondary | ICD-10-CM | POA: Diagnosis not present

## 2020-08-23 ENCOUNTER — Encounter: Payer: Self-pay | Admitting: Family Medicine

## 2020-08-23 LAB — PSA: PSA: 1.72 ng/mL (ref <=4.0)

## 2020-08-25 DIAGNOSIS — I2510 Atherosclerotic heart disease of native coronary artery without angina pectoris: Secondary | ICD-10-CM | POA: Insufficient documentation

## 2020-08-25 DIAGNOSIS — G4733 Obstructive sleep apnea (adult) (pediatric): Secondary | ICD-10-CM | POA: Diagnosis not present

## 2020-08-25 DIAGNOSIS — H903 Sensorineural hearing loss, bilateral: Secondary | ICD-10-CM | POA: Diagnosis not present

## 2020-08-25 DIAGNOSIS — R058 Other specified cough: Secondary | ICD-10-CM | POA: Diagnosis not present

## 2020-08-25 DIAGNOSIS — Z974 Presence of external hearing-aid: Secondary | ICD-10-CM | POA: Diagnosis not present

## 2020-08-25 DIAGNOSIS — E041 Nontoxic single thyroid nodule: Secondary | ICD-10-CM | POA: Diagnosis not present

## 2020-08-25 DIAGNOSIS — H8102 Meniere's disease, left ear: Secondary | ICD-10-CM | POA: Diagnosis not present

## 2020-09-08 NOTE — Progress Notes (Signed)
Referring-Catherine Metheney MD Reason for referral-aortic atherosclerosis  HPI: 80 year old male for evaluation of aortic atherosclerosis at request of Beatrice Lecher MD.  Chest CT February 2022 showed severe coronary atherosclerotic calcifications and aortic atherosclerosis.  There was note of a thyroid nodule and follow-up ultrasound recommended.  Cardiology now asked to evaluate.  Patient denies dyspnea on exertion, orthopnea, PND, pedal edema, exertional chest pain, palpitations or syncope.  Current Outpatient Medications  Medication Sig Dispense Refill  . ascorbic acid (VITAMIN C) 1000 MG tablet Take 1,000 mg by mouth daily with supper.     Marland Kitchen aspirin EC 81 MG tablet Take 81 mg by mouth daily.    . Cholecalciferol (VITAMIN D3) 2000 units TABS Take 2,000 Units by mouth at bedtime.     . Coenzyme Q10 (CO Q 10) 100 MG CAPS Take by mouth.    . famotidine (PEPCID) 20 MG tablet TAKE 1 TABLET BY MOUTH EVERYDAY AT BEDTIME 90 tablet 2  . glucosamine-chondroitin 500-400 MG tablet Take 1 tablet by mouth daily with supper.     . Omega-3 1000 MG CAPS Take 1 tablet by mouth daily.    . pantoprazole (PROTONIX) 40 MG tablet Take by mouth.    . simvastatin (ZOCOR) 80 MG tablet Take 80 mg by mouth daily.    . valsartan-hydrochlorothiazide (DIOVAN-HCT) 80-12.5 MG tablet TAKE 1 TABLET BY MOUTH EVERY DAY 90 tablet 1   No current facility-administered medications for this visit.    Allergies  Allergen Reactions  . Morphine Nausea And Vomiting  . Morphine And Related Nausea And Vomiting  . Cialis [Tadalafil] Other (See Comments)    Limb pain     Past Medical History:  Diagnosis Date  . Arthritis    osteoarthritis right hip  . Carotid artery plaque    mild  . Colon polyps   . Erectile dysfunction   . GERD (gastroesophageal reflux disease)    history- no current problems  . History of nephrolithiasis    episodes x 6   . HLD (hyperlipidemia)   . HTN (hypertension)   . Impaired  hearing    bilateral hearing aids  . Sleep apnea    cpap used with nasal clip  . Thyroid nodule     Past Surgical History:  Procedure Laterality Date  . cataract Bilateral 2015  . CYSTOSCOPY W/ STONE MANIPULATION     x 2  . HEMORRHOID SURGERY    . INGUINAL HERNIA REPAIR Right   . LAPAROSCOPIC DRAINAGE RENAL CYST    . LITHOTRIPSY     x 1   . TOTAL HIP ARTHROPLASTY Right 11/15/2015   Procedure: RIGHT TOTAL HIP ARTHROPLASTY ANTERIOR APPROACH;  Surgeon: Paralee Cancel, MD;  Location: [facility];  Service: Orthopedics;  Laterality: Right;  . TOTAL HIP ARTHROPLASTY Left 10/22/2017   Procedure: LEFT TOTAL HIP ARTHROPLASTY ANTERIOR APPROACH;  Surgeon: Paralee Cancel, MD;  Location: [facility];  Service: Orthopedics;  Laterality: Left;    Social History   Socioeconomic History  . Marital status: Married    Spouse name: [name]   . Number of children: 3  . Years of education: 76  . Highest education level: Master's degree (e.g., MA, MS, MEng, MEd, MSW, MBA)  Occupational History  . Occupation: Retired     Comment: Hydrologist  Tobacco Use  . Smoking status: Never Smoker  . Smokeless tobacco: Never Used  Vaping Use  . Vaping Use: Never used  Substance and Sexual Activity  . Alcohol use: No  .  Drug use: No  . Sexual activity: Yes  Other Topics Concern  . Not on file  Social History Narrative   Married.  No regular exercise.  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 Colon cancer  Father   . Heart attack Mother 46  . Diabetes Other        1st degree relative    ROS: Some problems with fatigue but no fevers or chills, productive cough, hemoptysis, dysphasia, odynophagia, melena, hematochezia, dysuria, hematuria, rash, seizure activity, orthopnea, PND, pedal edema, claudication. Remaining systems are negative.  Physical Exam:   Blood pressure 128/77, pulse 62, height 5\' 6"  (1.676 m), weight 177 lb 6.4 oz (80.5 kg).  General:  Well developed/well nourished in NAD Skin warm/dry Patient not depressed No peripheral clubbing Back-normal HEENT-normal/normal eyelids Neck supple/normal carotid upstroke bilaterally; no bruits; no JVD; no thyromegaly chest - CTA/ normal expansion CV - RRR/normal S1 and S2; no murmurs, rubs or gallops;  PMI nondisplaced Abdomen -NT/ND, no HSM, no mass, + bowel sounds, no bruit 2+ femoral pulses, no bruits Ext-no edema, chords, 2+ DP Neuro-grossly nonfocal  ECG -sinus bradycardia, no ST changes.  Personally reviewed  A/P  1 coronary artery disease-recent CT scan shows severe coronary calcification.  Patient is not having symptoms.  We will treat medically with aspirin 81 mg daily and statin.  I will arrange a stress nuclear study to screen for ischemia.  2 hypertension-blood pressure controlled.  Continue present medications and follow.  3 hyperlipidemia-we discussed risk factor modification.  Given documented coronary disease we we will increase atorvastatin to 80 mg daily.  Check lipids and liver in 12 weeks.  4 sleep apnea-continue CPAP.  Kirk Ruths, MD

## 2020-09-12 DIAGNOSIS — D3132 Benign neoplasm of left choroid: Secondary | ICD-10-CM | POA: Diagnosis not present

## 2020-09-12 DIAGNOSIS — H43813 Vitreous degeneration, bilateral: Secondary | ICD-10-CM | POA: Diagnosis not present

## 2020-09-12 DIAGNOSIS — H18603 Keratoconus, unspecified, bilateral: Secondary | ICD-10-CM | POA: Diagnosis not present

## 2020-09-12 DIAGNOSIS — H52203 Unspecified astigmatism, bilateral: Secondary | ICD-10-CM | POA: Diagnosis not present

## 2020-09-14 ENCOUNTER — Encounter: Payer: Self-pay | Admitting: *Deleted

## 2020-09-14 ENCOUNTER — Encounter: Payer: Self-pay | Admitting: Cardiology

## 2020-09-14 ENCOUNTER — Ambulatory Visit (INDEPENDENT_AMBULATORY_CARE_PROVIDER_SITE_OTHER): Payer: Medicare Other | Admitting: Cardiology

## 2020-09-14 ENCOUNTER — Other Ambulatory Visit: Payer: Self-pay

## 2020-09-14 VITALS — BP 128/77 | HR 62 | Ht 66.0 in | Wt 177.4 lb

## 2020-09-14 DIAGNOSIS — I700 Atherosclerosis of aorta: Secondary | ICD-10-CM | POA: Diagnosis not present

## 2020-09-14 DIAGNOSIS — I10 Essential (primary) hypertension: Secondary | ICD-10-CM | POA: Diagnosis not present

## 2020-09-14 DIAGNOSIS — I2510 Atherosclerotic heart disease of native coronary artery without angina pectoris: Secondary | ICD-10-CM | POA: Diagnosis not present

## 2020-09-14 MED ORDER — ATORVASTATIN CALCIUM 80 MG PO TABS: 80.0000 mg | ORAL_TABLET | Freq: Every day | ORAL | 3 refills | Status: DC

## 2020-09-14 NOTE — Patient Instructions (Signed)
Medication Instructions:   INCREASE ATORVASTATIN TO 80 MG ONCE DAILY= 2 OF THE 40 MG TABLETS ONCE DAILY  *If you need a refill on your cardiac medications before your next appointment, please call your pharmacy*   Lab Work:  Your physician recommends that you return for lab work in: [place]  If you have labs (blood work) drawn today and your tests are completely normal, you will receive your results only by: Marland Kitchen MyChart Message (if you have MyChart) OR . A paper copy in the mail If you have any lab test that is abnormal or we need to change your treatment, we will call you to review the results.   Testing/Procedures:  Your physician has requested that you have en exercise stress myoview. For further information please visit [email or web address]. Please follow instruction sheet, as given.     Follow-Up: At [facility], you and your health needs are our priority.  As part of our continuing mission to provide you with exceptional heart care, we have created designated Provider Care Teams.  These Care Teams include your primary Cardiologist (physician) and Advanced Practice Providers (APPs -  Physician Assistants and Nurse Practitioners) who all work together to provide you with the care you need, when you need it.  We recommend signing up for the patient portal called "MyChart".  Sign up information is provided on this After Visit Summary.  MyChart is used to connect with patients for Virtual Visits (Telemedicine).  Patients are able to view lab/test results, encounter notes, upcoming appointments, etc.  Non-urgent messages can be sent to your provider as well.   To learn more about what you can do with MyChart, go to [email or web address].    Your next appointment:   12 month(s)  The format for your next appointment:   In Person  Provider:   Kirk Ruths, MD

## 2020-09-16 DIAGNOSIS — H90A32 Mixed conductive and sensorineural hearing loss, unilateral, left ear with restricted hearing on the contralateral side: Secondary | ICD-10-CM | POA: Diagnosis not present

## 2020-09-16 DIAGNOSIS — Z974 Presence of external hearing-aid: Secondary | ICD-10-CM | POA: Diagnosis not present

## 2020-09-25 ENCOUNTER — Other Ambulatory Visit: Payer: Self-pay | Admitting: Family Medicine

## 2020-09-26 ENCOUNTER — Other Ambulatory Visit: Payer: Self-pay

## 2020-09-26 ENCOUNTER — Ambulatory Visit (HOSPITAL_COMMUNITY): Payer: Medicare Other | Attending: Cardiology

## 2020-09-26 DIAGNOSIS — I2510 Atherosclerotic heart disease of native coronary artery without angina pectoris: Secondary | ICD-10-CM | POA: Insufficient documentation

## 2020-09-26 DIAGNOSIS — I700 Atherosclerosis of aorta: Secondary | ICD-10-CM | POA: Diagnosis not present

## 2020-09-26 LAB — MYOCARDIAL PERFUSION IMAGING
Estimated workload: 6.4 METS
Exercise duration (min): 4 min
Exercise duration (sec): 30 s
LV dias vol: 69 mL (ref 62–150)
LV sys vol: 28 mL
MPHR: 141 {beats}/min
Peak HR: 133 {beats}/min
Percent HR: 95 %
RPE: 18
Rest HR: 58 {beats}/min
SDS: 3
SRS: 0
SSS: 3
TID: 1.12

## 2020-09-26 MED ORDER — TECHNETIUM TC 99M TETROFOSMIN IV KIT
10.9000 | PACK | Freq: Once | INTRAVENOUS | Status: AC | PRN
  Administered 2020-09-26: 10.9 via INTRAVENOUS
  Filled 2020-09-26: qty 11

## 2020-09-26 MED ORDER — TECHNETIUM TC 99M TETROFOSMIN IV KIT
32.9000 | PACK | Freq: Once | INTRAVENOUS | Status: AC | PRN
  Administered 2020-09-26: 32.9 via INTRAVENOUS
  Filled 2020-09-26: qty 33

## 2020-09-27 DIAGNOSIS — M25511 Pain in right shoulder: Secondary | ICD-10-CM | POA: Diagnosis not present

## 2020-10-10 DIAGNOSIS — I700 Atherosclerosis of aorta: Secondary | ICD-10-CM | POA: Diagnosis not present

## 2020-10-10 DIAGNOSIS — I2510 Atherosclerotic heart disease of native coronary artery without angina pectoris: Secondary | ICD-10-CM | POA: Diagnosis not present

## 2020-10-11 ENCOUNTER — Encounter: Payer: Self-pay | Admitting: Family Medicine

## 2020-10-11 LAB — LIPID PANEL
Cholesterol: 132 mg/dL (ref <200)
HDL: 44 mg/dL (ref >=40)
LDL Cholesterol (Calc): 67 mg/dL (calc)
Non-HDL Cholesterol (Calc): 88 mg/dL (calc) (ref <130)
Total CHOL/HDL Ratio: 3 (calc) (ref <5.0)
Triglycerides: 131 mg/dL (ref <150)

## 2020-10-11 LAB — HEPATIC FUNCTION PANEL
AG Ratio: 1.9 (calc) (ref 1.0–2.5)
ALT: 15 U/L (ref 9–46)
AST: 14 U/L (ref 10–35)
Albumin: 4.5 g/dL (ref 3.6–5.1)
Alkaline phosphatase (APISO): 54 U/L (ref 35–144)
Bilirubin, Direct: 0.2 mg/dL (ref 0.0–0.2)
Globulin: 2.4 g/dL (calc) (ref 1.9–3.7)
Indirect Bilirubin: 0.6 mg/dL (calc) (ref 0.2–1.2)
Total Bilirubin: 0.8 mg/dL (ref 0.2–1.2)
Total Protein: 6.9 g/dL (ref 6.1–8.1)

## 2020-12-10 ENCOUNTER — Other Ambulatory Visit: Payer: Self-pay | Admitting: Family Medicine

## 2020-12-10 DIAGNOSIS — K2100 Gastro-esophageal reflux disease with esophagitis, without bleeding: Secondary | ICD-10-CM

## 2020-12-14 ENCOUNTER — Ambulatory Visit: Payer: Medicare Other | Admitting: Family Medicine

## 2020-12-17 ENCOUNTER — Other Ambulatory Visit: Payer: Self-pay | Admitting: Family Medicine

## 2020-12-23 ENCOUNTER — Other Ambulatory Visit: Payer: Self-pay | Admitting: Family Medicine

## 2020-12-27 ENCOUNTER — Other Ambulatory Visit: Payer: Self-pay | Admitting: Family Medicine

## 2020-12-28 ENCOUNTER — Other Ambulatory Visit: Payer: Self-pay | Admitting: *Deleted

## 2020-12-28 MED ORDER — ATORVASTATIN CALCIUM 40 MG PO TABS: 40.0000 mg | ORAL_TABLET | Freq: Every day | ORAL | 3 refills | Status: DC

## 2021-01-18 ENCOUNTER — Other Ambulatory Visit: Payer: Self-pay | Admitting: Family Medicine

## 2021-01-18 NOTE — Telephone Encounter (Signed)
Call pt for f/u for bp. 30 day supply sent to pharmacy

## 2021-01-18 NOTE — Telephone Encounter (Signed)
Pt made an appt. for 01/31/2021 @ 3:20- tvt

## 2021-01-27 ENCOUNTER — Other Ambulatory Visit: Payer: Self-pay | Admitting: Family Medicine

## 2021-01-31 ENCOUNTER — Encounter: Payer: Self-pay | Admitting: Family Medicine

## 2021-01-31 ENCOUNTER — Ambulatory Visit (INDEPENDENT_AMBULATORY_CARE_PROVIDER_SITE_OTHER): Payer: Medicare Other | Admitting: Family Medicine

## 2021-01-31 ENCOUNTER — Other Ambulatory Visit: Payer: Self-pay

## 2021-01-31 VITALS — BP 124/61 | HR 75 | Ht 66.0 in | Wt 177.1 lb

## 2021-01-31 DIAGNOSIS — I2510 Atherosclerotic heart disease of native coronary artery without angina pectoris: Secondary | ICD-10-CM | POA: Diagnosis not present

## 2021-01-31 DIAGNOSIS — E041 Nontoxic single thyroid nodule: Secondary | ICD-10-CM

## 2021-01-31 DIAGNOSIS — I10 Essential (primary) hypertension: Secondary | ICD-10-CM

## 2021-01-31 DIAGNOSIS — I700 Atherosclerosis of aorta: Secondary | ICD-10-CM | POA: Diagnosis not present

## 2021-01-31 NOTE — Progress Notes (Signed)
Established Patient Office Visit  Subjective:  Patient ID: [name], male    DOB: [birth year]  Age: 80 y.o. MRN: [id number]  CC:  Chief Complaint  Patient presents with   Hypertension    HPI [name] presents for   Hypertension- Pt denies chest pain, SOB, dizziness, or heart palpitations.  Taking meds as directed w/o problems.  Denies medication side effects.  He has been working hard to lose about 10 pounds.  He has been trying to eat more healthy.  Has been having some problems with his right shoulder as well he has been seeing an orthopedist in fact he had an injection done well over 6 months ago and it did well for several months but pain returned so he went back to the orthopedist and had another injection done he says it only helped for maybe a month and then started become painful but yet in a different way he says now if he lays flat on his back with his arm flat it will actually throb and he has to move his shoulder to get his comfort and relief.  Did have some questions about whether or not he needed a repeat CT next year.  Past Medical History:  Diagnosis Date   Arthritis    osteoarthritis right hip   Carotid artery plaque    mild   Colon polyps    Erectile dysfunction    GERD (gastroesophageal reflux disease)    history- no current problems   History of nephrolithiasis    episodes x 6    HLD (hyperlipidemia)    HTN (hypertension)    Impaired hearing    bilateral hearing aids   Sleep apnea    cpap used with nasal clip   Thyroid nodule     Past Surgical History:  Procedure Laterality Date   cataract Bilateral 2015   CYSTOSCOPY W/ STONE MANIPULATION     x 2   HEMORRHOID SURGERY     INGUINAL HERNIA REPAIR Right    LAPAROSCOPIC DRAINAGE RENAL CYST     LITHOTRIPSY     x 1    TOTAL HIP ARTHROPLASTY Right 11/15/2015   Procedure: RIGHT TOTAL HIP ARTHROPLASTY ANTERIOR APPROACH;  Surgeon: Paralee Cancel, MD;  Location: [facility];  Service: Orthopedics;   Laterality: Right;   TOTAL HIP ARTHROPLASTY Left 10/22/2017   Procedure: LEFT TOTAL HIP ARTHROPLASTY ANTERIOR APPROACH;  Surgeon: Paralee Cancel, MD;  Location: [facility];  Service: Orthopedics;  Laterality: Left;    Family History  Problem Relation Age of Onset   Colon cancer Father    Heart attack Mother 59   Diabetes Other        1st degree relative    Social History   Socioeconomic History   Marital status: Married    Spouse name: [name]    Number of children: 3   Years of education: 18   Highest education level: Master's degree (e.g., MA, MS, MEng, MEd, MSW, MBA)  Occupational History   Occupation: Retired     Comment: Environmental consultant super intendant  Tobacco Use   Smoking status: Never   Smokeless tobacco: Never  Vaping Use   Vaping Use: Never used  Substance and Sexual Activity   Alcohol use: No   Drug use: No   Sexual activity: Yes  Other Topics Concern   Not on file  Social History Narrative   Married.  No regular exercise.  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      aspirin EC 81 MG tablet Take 81 mg by mouth daily.     atorvastatin (LIPITOR) 40 MG tablet Take 1 tablet (40 mg total) by mouth daily. 90 tablet 3   Cholecalciferol (VITAMIN D3) 2000 units TABS Take 2,000 Units by mouth at bedtime.      Coenzyme Q10 (CO Q 10) 100 MG CAPS Take by  mouth.     famotidine (PEPCID) 20 MG tablet TAKE 1 TABLET BY MOUTH EVERYDAY AT BEDTIME 90 tablet 2   glucosamine-chondroitin 500-400 MG tablet Take 1 tablet by mouth daily with supper.      Omega-3 1000 MG CAPS Take 1 tablet by mouth daily.     pantoprazole (PROTONIX) 40 MG tablet Take by mouth.     valsartan-hydrochlorothiazide (DIOVAN-HCT) 80-12.5 MG tablet Take 1 tablet by mouth daily. 90 tablet 1   No facility-administered medications prior to visit.    Allergies  Allergen Reactions   Morphine Nausea And Vomiting   Morphine And Related Nausea And Vomiting   Cialis [Tadalafil] Other (See Comments)    Limb pain    ROS Review of Systems    Objective:    Physical Exam Constitutional:      Appearance: Normal appearance. He is well-developed.  HENT:     Head: Normocephalic and atraumatic.  Cardiovascular:     Rate and Rhythm: Normal rate and regular rhythm.     Heart sounds: Normal heart sounds.  Pulmonary:     Effort: Pulmonary effort is normal.     Breath sounds: Normal breath sounds.  Musculoskeletal:     Cervical back: Neck supple. No rigidity or tenderness.  Skin:    General: Skin is warm and dry.  Neurological:     Mental Status: He is alert and oriented to person, place, and time. Mental status is at baseline.  Psychiatric:        Behavior: Behavior normal.    BP 124/61   Pulse 75   Ht '5\' 6"'$  (1.676 m)   Wt 177 lb 1.9 oz (80.3 kg)   SpO2 99%   BMI 28.59 kg/m  Wt Readings from Last 3 Encounters:  01/31/21 177 lb 1.9 oz (80.3 kg)  09/26/20 177 lb (80.3 kg)  09/14/20 177 lb 6.4 oz (80.5 kg)     There are no preventive care reminders to display for this patient.  There are no preventive care reminders to display for this patient.  Lab Results  Component Value Date   TSH 0.92 04/17/2017   Lab Results  Component Value Date   WBC 5.6 06/16/2020   HGB 16.6 06/16/2020   HCT 48.1 06/16/2020   MCV 89.9 06/16/2020   PLT 168 06/16/2020   Lab Results   Component Value Date   NA 141 06/16/2020   K 3.9 06/16/2020   CO2 36 (H)  06/16/2020   GLUCOSE 102 (H) 06/16/2020   BUN 16 06/16/2020   CREATININE 1.09 06/16/2020   BILITOT 0.8 10/10/2020   ALKPHOS 64 05/06/2016   AST 14 10/10/2020   ALT 15 10/10/2020   PROT 6.9 10/10/2020   ALBUMIN 4.2 10/05/2013   CALCIUM 9.6 06/16/2020   ANIONGAP 11 10/23/2017   GFR 78.93 10/05/2013   Lab Results  Component Value Date   CHOL 132 10/10/2020   Lab Results  Component Value Date   HDL 44 10/10/2020   Lab Results  Component Value Date   LDLCALC 67 10/10/2020   Lab Results  Component Value Date   TRIG 131 10/10/2020   Lab Results  Component Value Date   CHOLHDL 3.0 10/10/2020   Lab Results  Component Value Date   HGBA1C 5.0 03/23/2019      Assessment & Plan:   Problem List Items Addressed This Visit       Cardiovascular and Mediastinum   Essential hypertension - Primary   Relevant Orders   BASIC METABOLIC PANEL WITH GFR   Aortic atherosclerosis (HCC)    No recent CP or SOB.  Continue daily statin.         Endocrine   Thyroid nodule    Recommend repeat thyroid after ultrasound in February or March 2023 to follow-up on the nodules that were biopsied.  They were negative but I do think we should follow-up with a repeat ultrasound at that time.       No orders of the defined types were placed in this encounter.   Follow-up: Return in about 6 months (around 07/31/2021) for Hypertension.    Beatrice Lecher, MD

## 2021-01-31 NOTE — Assessment & Plan Note (Signed)
Recommend repeat thyroid after ultrasound in February or March 2023 to follow-up on the nodules that were biopsied.  They were negative but I do think we should follow-up with a repeat ultrasound at that time.

## 2021-01-31 NOTE — Assessment & Plan Note (Signed)
No recent CP or SOB.  Continue daily statin.

## 2021-01-31 NOTE — Patient Instructions (Signed)
Due for labs in November. You don't have to fast.

## 2021-01-31 NOTE — Progress Notes (Signed)
Pt wanted to know if he will need to have repeat chest CT done sine the one that he had done in February was done and showed Atelectasis. He has been working on diet and exercise since then and wasn't sure if this was something that he should get repeated.

## 2021-02-08 ENCOUNTER — Other Ambulatory Visit: Payer: Self-pay

## 2021-02-08 ENCOUNTER — Ambulatory Visit (INDEPENDENT_AMBULATORY_CARE_PROVIDER_SITE_OTHER): Payer: Medicare Other | Admitting: Family Medicine

## 2021-02-08 VITALS — BP 126/74 | HR 64 | Ht 66.0 in | Wt 179.1 lb

## 2021-02-08 DIAGNOSIS — Z0000 Encounter for general adult medical examination without abnormal findings: Secondary | ICD-10-CM | POA: Diagnosis not present

## 2021-02-08 NOTE — Progress Notes (Signed)
MEDICARE ANNUAL WELLNESS VISIT  02/08/2021  Subjective:  [name] is a 80 y.o. male patient of [name], Dennis Kocher, MD who had a Medicare Annual Wellness Visit today. [name] is Retired and lives with their spouse. he has 3 children. he reports that he is socially active and does interact with friends/family regularly. he is minimally physically active and enjoys writing, pottery and grand kids.  Patient Care Team: Dennis Marry, MD as PCP - General (Family Medicine)  Advanced Directives 02/08/2021 07/28/2019 10/23/2017 10/22/2017 10/16/2017 12/14/2016 11/15/2015  Does Patient Have a Medical Advance Directive? Yes Yes Yes Yes Yes Yes Yes  Type of Paramedic of [place];Living will [place];Living will [place];Living will [place];Living will [place];Living will - Living will  Does patient want to make changes to medical advance directive? No - Patient declined No - Patient declined No - Patient declined No - Patient declined No - Patient declined No - Patient declined No - Patient declined  Copy of [place] in Chart? Yes - validated most recent copy scanned in chart (See row information) No - copy requested No - copy requested No - copy requested No - copy requested - No - copy requested    Hospital Utilization Over the Past 12 Months: # of hospitalizations or ER visits: 0 # of surgeries: 0  Review of Systems    Patient reports that his overall health is better when compared to last year.  Review of Systems: History obtained from chart review and the patient  All other systems negative.  Pain Assessment Pain : No/denies pain     Current Medications & Allergies (verified) Allergies as of 02/08/2021       Reactions   Morphine Nausea And Vomiting   Morphine And Related Nausea And Vomiting   Cialis [tadalafil] Other (See Comments)   Limb pain         Medication List        Accurate as of February 08, 2021 10:33 AM. If you have any questions, ask your nurse or doctor.          ascorbic acid 1000 MG tablet Commonly known as: VITAMIN C Take 1,000 mg by mouth daily with supper.   aspirin EC 81 MG tablet Take 81 mg by mouth daily.   atorvastatin 40 MG tablet Commonly known as: LIPITOR Take 1 tablet (40 mg total) by mouth daily.   Co Q 10 100 MG Caps Take by mouth.   Denta 5000 Plus 1.1 % Crea dental cream Generic drug: sodium fluoride See admin instructions.   famotidine 20 MG tablet Commonly known as: PEPCID TAKE 1 TABLET BY MOUTH EVERYDAY AT BEDTIME   glucosamine-chondroitin 500-400 MG tablet Take 1 tablet by mouth daily with supper.   Omega-3 1000 MG Caps Take 1 tablet by mouth daily.   pantoprazole 40 MG tablet Commonly known as: PROTONIX Take by mouth.   valsartan-hydrochlorothiazide 80-12.5 MG tablet Commonly known as: DIOVAN-HCT Take 1 tablet by mouth daily.   Vitamin D3 50 MCG (2000 UT) Tabs Take 2,000 Units by mouth at bedtime.        History (reviewed): Past Medical History:  Diagnosis Date   Arthritis    osteoarthritis right hip   Carotid artery plaque    mild   Colon polyps    Erectile dysfunction    GERD (gastroesophageal reflux disease)    history- no current problems  History of nephrolithiasis    episodes x 6    HLD (hyperlipidemia)    HTN (hypertension)    Impaired hearing    bilateral hearing aids   Sleep apnea    cpap used with nasal clip   Thyroid nodule    Past Surgical History:  Procedure Laterality Date   cataract Bilateral 05/21/2013   CYSTOSCOPY W/ STONE MANIPULATION     x 2   EYE SURGERY  cataracts for both eyes   HEMORRHOID SURGERY     INGUINAL HERNIA REPAIR Right    JOINT REPLACEMENT  2017, 2019   LAPAROSCOPIC DRAINAGE RENAL CYST     LITHOTRIPSY     x 1    TOTAL HIP ARTHROPLASTY Right 11/15/2015   Procedure: RIGHT TOTAL HIP ARTHROPLASTY ANTERIOR  APPROACH;  Surgeon: Paralee Cancel, MD;  Location: [facility];  Service: Orthopedics;  Laterality: Right;   TOTAL HIP ARTHROPLASTY Left 10/22/2017   Procedure: LEFT TOTAL HIP ARTHROPLASTY ANTERIOR APPROACH;  Surgeon: Paralee Cancel, MD;  Location: [facility];  Service: Orthopedics;  Laterality: Left;   Family History  Problem Relation Age of Onset   Colon cancer Father    Cancer Father    Heart attack Mother 68   Hearing loss Mother    Heart disease Mother    Diabetes Other        1st degree relative   Social History   Socioeconomic History   Marital status: Married    Spouse name: [name]    Number of children: 3   Years of education: 18   Highest education level: Master's degree (e.g., MA, MS, MEng, MEd, MSW, MBA)  Occupational History   Occupation: Retired     Comment: Environmental consultant super intendant  Tobacco Use   Smoking status: Never   Smokeless tobacco: Never  Vaping Use   Vaping Use: Never used  Substance and Sexual Activity   Alcohol use: No   Drug use: No   Sexual activity: Yes  Other Topics Concern   Not on file  Social History Narrative   Married.  No regular exercise.  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 [employer]   Social Determinants of Health   Financial Resource Strain: Low Risk    Difficulty of Paying Living Expenses: Not hard at all  Food Insecurity: No Food Insecurity   Worried About Charity fundraiser in the Last Year: Never true   Arboriculturist in the Last Year: Never true  Transportation Needs: No Transportation Needs   Lack of Transportation (Medical): No   Lack of Transportation (Non-Medical): No  Physical Activity: Inactive   Days of Exercise per Week: 0 days   Minutes of Exercise per Session: 0 min  Stress: No Stress Concern Present   Feeling of Stress  : Not at all  Social Connections: Socially Integrated   Frequency of Communication with Friends and Family: More than three times a week   Frequency of Social Gatherings with Friends and Family: Never   Attends Religious Services: More than 4 times per year   Active Member of [employer] or Organizations: Yes   Attends Music therapist: More than 4 times per year   Marital Status: Married    Activities of Daily Living In your present state of health, do  you have any difficulty performing the following activities: 02/08/2021  Hearing? Y  Comment wears hearing aids in both ears.  Vision? N  Difficulty concentrating or making decisions? N  Walking or climbing stairs? N  Dressing or bathing? N  Doing errands, shopping? N  Preparing Food and eating ? N  Using the Toilet? N  In the past six months, have you accidently leaked urine? N  Do you have problems with loss of bowel control? N  Managing your Medications? N  Managing your Finances? N  Housekeeping or managing your Housekeeping? N  Some recent data might be hidden    Patient Education/Literacy How often do you need to have someone help you when you read instructions, pamphlets, or other written materials from your doctor or pharmacy?: 1 - Never What is the last grade level you completed in school?: Masters Degree  Exercise Current Exercise Habits: The patient does not participate in regular exercise at present, Exercise limited by: None identified  Diet Patient reports consuming 3 meals a day and 0 snack(s) a day Patient reports that his primary diet is: Regular Patient reports that he does have regular access to food.   Depression Screen PHQ 2/9 Scores 02/08/2021 01/31/2021 12/14/2019 07/28/2019 03/20/2019 04/09/2018 04/17/2017  PHQ - 2 Score 0 0 0 0 0 0 0  PHQ- 9 Score - - 1 - - - -     Fall Risk Fall Risk  02/08/2021 01/31/2021 12/14/2019 07/28/2019 03/20/2019  Falls in the past year? 0 0 0 0 0  Comment - - - - -   Number falls in past yr: 0 0 0 - 0  Injury with Fall? 0 0 0 - 0  Risk for fall due to : No Fall Risks No Fall Risks - No Fall Risks -  Follow up Falls evaluation completed Falls prevention discussed;Falls evaluation completed Falls evaluation completed Falls prevention discussed -     Objective:   BP 126/74 (BP Location: Right Arm, Patient Position: Sitting, Cuff Size: Normal)   Pulse 64   Ht 5\' 6"  (1.676 m)   Wt 179 lb 1.9 oz (81.2 kg)   SpO2 96%   BMI 28.91 kg/m   Last Weight  Most recent update: 02/08/2021 10:06 AM    Weight  81.2 kg (179 lb 1.9 oz)             Body mass index is 28.91 kg/m.  Hearing/Vision  Herby did not have difficulty with hearing/understanding during the face-to-face interview Tallie did not have difficulty with his vision during the face-to-face interview Reports that he has had a formal eye exam by an eye care professional within the past year Reports that he has had a formal hearing evaluation within the past year  Cognitive Function: 6CIT Screen 02/08/2021 07/28/2019  What Year? 0 points 0 points  What month? 0 points 0 points  What time? 0 points 0 points  Count back from 20 0 points 0 points  Months in reverse 0 points 0 points  Repeat phrase 0 points 2 points  Total Score 0 2    Normal Cognitive Function Screening: Yes (Normal:0-7, Significant for Dysfunction: >8)  Immunization & Health Maintenance Record Immunization History  Administered Date(s) Administered   Fluad Quad(high Dose 65+) 03/20/2019, 03/21/2020   Influenza Split 05/02/2011   Influenza Whole 03/07/2009, 04/26/2010   Influenza, High Dose Seasonal PF 03/08/2017, 03/15/2018   Influenza,inj,Quad PF,6+ Mos 05/09/2015   Influenza-Unspecified 02/18/2014   PFIZER(Purple Top)SARS-COV-2 Vaccination 06/26/2019, 07/22/2019  PPD Test 04/25/2015   Pneumococcal Conjugate-13 02/12/2007, 08/23/2014   Pneumococcal Polysaccharide-23 03/07/2009   Td 07/16/2003   Tdap 10/05/2013    Zoster, Live 01/06/2008    Health Maintenance  Topic Date Due   INFLUENZA VACCINE  08/18/2021 (Originally 12/19/2020)   COVID-19 Vaccine (3 - Booster for [place] series) 02/16/2022 (Originally 12/22/2019)   Zoster Vaccines- Shingrix (1 of 2) 05/02/2022 (Originally 07/22/1991)   TETANUS/TDAP  10/06/2023   Hepatitis C Screening  Completed   HPV VACCINES  Aged Out       Assessment  This is a routine wellness examination for [employer]  Health Maintenance: Due or Overdue There are no preventive care reminders to display for this patient.  Leanne Lovely does not need a referral for Community Assistance: Care Management:   no Social Work:    no Prescription Assistance:  no Nutrition/Diabetes Education:  no   Plan:  Personalized Goals  Goals Addressed               This Visit's Progress     Patient Stated (pt-stated)        02/08/2021 AWV Goal: Exercise for General Health  Patient will verbalize understanding of the benefits of increased physical activity: Exercising regularly is important. It will improve your overall fitness, flexibility, and endurance. Regular exercise also will improve your overall health. It can help you control your weight, reduce stress, and improve your bone density. Over the next year, patient will increase physical activity as tolerated with a goal of at least 150 minutes of moderate physical activity per week.  You can tell that you are exercising at a moderate intensity if your heart starts beating faster and you start breathing faster but can still hold a conversation. Moderate-intensity exercise ideas include: Walking 1 mile (1.6 km) in about 15 minutes Biking Hiking Golfing Dancing Water aerobics Patient will verbalize understanding of everyday activities that increase physical activity by providing examples like the following: Yard work, such as: Therapist, sports Gardening Washing windows or floors Patient will be able to explain general safety guidelines for exercising:  Before you start a new exercise program, talk with your health care provider. Do not exercise so much that you hurt yourself, feel dizzy, or get very short of breath. Wear comfortable clothes and wear shoes with good support. Drink plenty of water while you exercise to prevent dehydration or heat stroke. Work out until your breathing and your heartbeat get faster.        Personalized Health Maintenance & Screening Recommendations  Influenza vaccine Shingles vaccine  Lung Cancer Screening Recommended: no (Low Dose CT Chest recommended if Age 67-80 years, 30 pack-year currently smoking OR have quit w/in past 15 years) Hepatitis C Screening recommended: no HIV Screening recommended: no  Advanced Directives: Written information was not given per the patient's request.  Referrals & Orders No orders of the defined types were placed in this encounter.   Follow-up Plan Follow-up with Dennis Marry, MD as planned Schedule your nurse visit for your flu shot.  Schedule your shingles vaccine at your pharmacy. Medicare wellness visit in one year. AVS printed and given to the patient.   I have personally reviewed and noted the following in the patient's chart:   Medical and social history Use of alcohol, tobacco or illicit drugs  Current medications and supplements Functional ability and status Nutritional status Physical activity Advanced  directives List of other physicians Hospitalizations, surgeries, and ER visits in previous 12 months Vitals Screenings to include cognitive, depression, and falls Referrals and appointments  In addition, I have reviewed and discussed with patient certain preventive protocols, quality metrics, and best practice recommendations. A written personalized care plan for preventive services as well as general  preventive health recommendations were provided to patient.     Tinnie Gens, RN  02/08/2021

## 2021-02-08 NOTE — Patient Instructions (Addendum)
  [place] Maintenance Summary and Written Plan of Care  Mr. Macaraeg ,  Thank you for allowing me to perform your Medicare Annual Wellness Visit and for your ongoing commitment to your health.   Health Maintenance & Immunization History Health Maintenance  Topic Date Due   INFLUENZA VACCINE  08/18/2021 (Originally 12/19/2020)   COVID-19 Vaccine (3 - Booster for [place] series) 02/16/2022 (Originally 12/22/2019)   Zoster Vaccines- Shingrix (1 of 2) 05/02/2022 (Originally 07/22/1991)   TETANUS/TDAP  10/06/2023   Hepatitis C Screening  Completed   HPV VACCINES  Aged Out   Immunization History  Administered Date(s) Administered   Fluad Quad(high Dose 65+) 03/20/2019, 03/21/2020   Influenza Split 05/02/2011   Influenza Whole 03/07/2009, 04/26/2010   Influenza, High Dose Seasonal PF 03/08/2017, 03/15/2018   Influenza,inj,Quad PF,6+ Mos 05/09/2015   Influenza-Unspecified 02/18/2014   PFIZER(Purple Top)SARS-COV-2 Vaccination 06/26/2019, 07/22/2019   PPD Test 04/25/2015   Pneumococcal Conjugate-13 02/12/2007, 08/23/2014   Pneumococcal Polysaccharide-23 03/07/2009   Td 07/16/2003   Tdap 10/05/2013   Zoster, Live 01/06/2008    These are the patient goals that we discussed:  Goals Addressed               This Visit's Progress     Patient Stated (pt-stated)        02/08/2021 AWV Goal: Exercise for General Health  Patient will verbalize understanding of the benefits of increased physical activity: Exercising regularly is important. It will improve your overall fitness, flexibility, and endurance. Regular exercise also will improve your overall health. It can help you control your weight, reduce stress, and improve your bone density. Over the next year, patient will increase physical activity as tolerated with a goal of at least 150 minutes of moderate physical activity per week.  You can tell that you are exercising at a moderate intensity if your heart  starts beating faster and you start breathing faster but can still hold a conversation. Moderate-intensity exercise ideas include: Walking 1 mile (1.6 km) in about 15 minutes Biking Hiking Golfing Dancing Water aerobics Patient will verbalize understanding of everyday activities that increase physical activity by providing examples like the following: Yard work, such as: Sales promotion account executive Gardening Washing windows or floors Patient will be able to explain general safety guidelines for exercising:  Before you start a new exercise program, talk with your health care provider. Do not exercise so much that you hurt yourself, feel dizzy, or get very short of breath. Wear comfortable clothes and wear shoes with good support. Drink plenty of water while you exercise to prevent dehydration or heat stroke. Work out until your breathing and your heartbeat get faster.          This is a list of Health Maintenance Items that are overdue or due now: There are no preventive care reminders to display for this patient.   Orders/Referrals Placed Today: No orders of the defined types were placed in this encounter.  (Contact our referral department at [phone] if you have not spoken with someone about your referral appointment within the next 5 days)    Follow-up Plan Follow-up with Hali Marry, MD as planned Schedule your nurse visit for your flu shot.  Schedule your shingles vaccine at your pharmacy. Medicare wellness visit in one year. AVS printed and given to the patient.

## 2021-03-17 DIAGNOSIS — Z23 Encounter for immunization: Secondary | ICD-10-CM | POA: Diagnosis not present

## 2021-03-31 DIAGNOSIS — I10 Essential (primary) hypertension: Secondary | ICD-10-CM | POA: Diagnosis not present

## 2021-04-01 LAB — BASIC METABOLIC PANEL WITH GFR
BUN: 20 mg/dL (ref 7–25)
CO2: 33 mmol/L — ABNORMAL HIGH (ref 20–32)
Calcium: 9.7 mg/dL (ref 8.6–10.3)
Chloride: 98 mmol/L (ref 98–110)
Creat: 1.09 mg/dL (ref 0.70–1.28)
Glucose, Bld: 113 mg/dL — ABNORMAL HIGH (ref 65–99)
Potassium: 3.6 mmol/L (ref 3.5–5.3)
Sodium: 140 mmol/L (ref 135–146)
eGFR: 69 mL/min/{1.73_m2} (ref >=60)

## 2021-04-03 NOTE — Progress Notes (Signed)
Your lab work is within acceptable range and there are no concerning findings.   ?

## 2021-04-11 ENCOUNTER — Encounter: Payer: Self-pay | Admitting: Family Medicine

## 2021-04-11 ENCOUNTER — Telehealth (INDEPENDENT_AMBULATORY_CARE_PROVIDER_SITE_OTHER): Payer: Medicare Other | Admitting: Family Medicine

## 2021-04-11 VITALS — BP 123/57 | HR 61 | Temp 98.0°F

## 2021-04-11 DIAGNOSIS — R051 Acute cough: Secondary | ICD-10-CM | POA: Diagnosis not present

## 2021-04-11 DIAGNOSIS — I2510 Atherosclerotic heart disease of native coronary artery without angina pectoris: Secondary | ICD-10-CM | POA: Diagnosis not present

## 2021-04-11 MED ORDER — AZITHROMYCIN 250 MG PO TABS: ORAL_TABLET | ORAL | 0 refills | Status: AC

## 2021-04-11 NOTE — Progress Notes (Signed)
LVM  advising pt that I was calling to do prescreening.   Pt reports that his sxs started last Friday. Denies any fevers. He has a runny nose, heaviness in his chest, cough that is deep, nauseated, tylenol but when wears off sxs come back, using cough drops for cough, when he does bring up something from the coughing its clear.  He stated that he had something like this about 6 yrs ago he saw Dr. Melvyn Novas and was given prednisone, ABX. After that cleared he was given Pantoprazole and Famotidine. Pt stated that he had stopped taking the Pantoprazole for about 2 years. 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

## 2021-04-11 NOTE — Progress Notes (Signed)
    Virtual Visit via Video Note  I connected with [name] on 04/11/21 at 10:50 AM EST by a video enabled telemedicine application and verified that I am speaking with the correct person using two identifiers.   I discussed the limitations of evaluation and management by telemedicine and the availability of in person appointments. The patient expressed understanding and agreed to proceed.  Patient location: at home Provider location: in office  Subjective:    CC:  No chief complaint on file.   HPI: Sxs started Friday.    Past medical history, Surgical history, Family history not pertinant except as noted below, Social history, Allergies, and medications have been entered into the medical record, reviewed, and corrections made.    Objective:    General: Speaking clearly in complete sentences without any shortness of breath.  Alert and oriented x3.  Normal judgment. No apparent acute distress.    Impression and Recommendations:    Problem List Items Addressed This Visit   None   No orders of the defined types were placed in this encounter.   No orders of the defined types were placed in this encounter.    I discussed the assessment and treatment plan with the patient. The patient was provided an opportunity to ask questions and all were answered. The patient agreed with the plan and demonstrated an understanding of the instructions.   The patient was advised to call back or seek an in-person evaluation if the symptoms worsen or if the condition fails to improve as anticipated.   Beatrice Lecher, MD

## 2021-04-11 NOTE — Progress Notes (Signed)
[facility]  Acute Office Visit  Subjective:    Patient ID: [name], male    DOB: 1940-10-18, 80 y.o.   MRN: [id number]  Chief Complaint  Patient presents with   Cough    HPI  Visit was initially started over a telephone call.  But after discussing his symptoms I asked him to come in in person today so that I could listen to his chest.  Pt reports that his sxs started last Friday. Denies any fevers. He has a runny nose, heaviness in his chest, cough that is deep, nauseated, tylenol but when wears off sxs come back, using cough drops for cough, when he does bring up something from the coughing its clear.  He denies feeling short of breath or wheezing.  He is just concerned about the heaviness in his chest.   He stated that he had something like this about 6 yrs ago he saw Dr. Melvyn Novas and was given prednisone, ABX. After that cleared he was given Pantoprazole and Famotidine. Pt stated that he had stopped taking the Pantoprazole for about 2 years. I asked pt about the Pantoprazole because we have it as he is taking this medication he apologized and stated that he should have asked that it be removed due to not taking       Past Medical History:  Diagnosis Date   Arthritis    osteoarthritis right hip   Carotid artery plaque    mild   Colon polyps    Erectile dysfunction    GERD (gastroesophageal reflux disease)    history- no current problems   History of nephrolithiasis    episodes x 6    HLD (hyperlipidemia)    HTN (hypertension)    Impaired hearing    bilateral hearing aids   Sleep apnea    cpap used with nasal clip   Thyroid nodule     Past Surgical History:  Procedure Laterality Date   cataract Bilateral 05/21/2013   CYSTOSCOPY W/ STONE MANIPULATION     x 2   EYE SURGERY  cataracts for both eyes   HEMORRHOID SURGERY     INGUINAL HERNIA REPAIR Right    JOINT REPLACEMENT  2017, 2019   LAPAROSCOPIC DRAINAGE RENAL CYST     LITHOTRIPSY     x 1    TOTAL HIP ARTHROPLASTY  Right 11/15/2015   Procedure: RIGHT TOTAL HIP ARTHROPLASTY ANTERIOR APPROACH;  Surgeon: Paralee Cancel, MD;  Location: [facility];  Service: Orthopedics;  Laterality: Right;   TOTAL HIP ARTHROPLASTY Left 10/22/2017   Procedure: LEFT TOTAL HIP ARTHROPLASTY ANTERIOR APPROACH;  Surgeon: Paralee Cancel, MD;  Location: [facility];  Service: Orthopedics;  Laterality: Left;    Family History  Problem Relation Age of Onset   Colon cancer Father    Cancer Father    Heart attack Mother 43   Hearing loss Mother    Heart disease Mother    Diabetes Other        1st degree relative    Social History   Socioeconomic History   Marital status: Married    Spouse name: [name]    Number of children: 3   Years of education: 18   Highest education level: Master's degree (e.g., MA, MS, MEng, MEd, MSW, MBA)  Occupational History   Occupation: Retired     Comment: Environmental consultant super intendant  Tobacco Use   Smoking status: Never   Smokeless tobacco: Never  Vaping Use   Vaping Use: Never used  Substance  and Sexual Activity   Alcohol use: No   Drug use: No   Sexual activity: Yes  Other Topics Concern   Not on file  Social History Narrative   Married.  No regular exercise.  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 [employer]   Social Determinants of Health   Financial Resource Strain: Low Risk    Difficulty of Paying Living Expenses: Not hard at all  Food Insecurity: No Food Insecurity   Worried About Charity fundraiser in the Last Year: Never true   Arboriculturist in the Last Year: Never true  Transportation Needs: No Transportation Needs   Lack of Transportation (Medical): No   Lack of Transportation (Non-Medical): No  Physical Activity: Inactive   Days of Exercise per Week: 0 days   Minutes of Exercise  per Session: 0 min  Stress: No Stress Concern Present   Feeling of Stress : Not at all  Social Connections: Socially Integrated   Frequency of Communication with Friends and Family: More than three times a week   Frequency of Social Gatherings with Friends and Family: Never   Attends Religious Services: More than 4 times per year   Active Member of [employer] or Organizations: Yes   Attends Music therapist: More than 4 times per year   Marital Status: Married  Human resources officer Violence: Not At Risk   Fear of Current or Ex-Partner: No   Emotionally Abused: No   Physically Abused: No   Sexually Abused: No    Outpatient Medications Prior to Visit  Medication Sig Dispense Refill   ascorbic acid (VITAMIN C) 1000 MG tablet Take 1,000 mg by mouth daily with supper.      aspirin EC 81 MG tablet Take 81 mg by mouth daily.     atorvastatin (LIPITOR) 40 MG tablet Take 1 tablet (40 mg total) by mouth daily. 90 tablet 3   Cholecalciferol (VITAMIN D3) 2000 units TABS Take 2,000 Units by mouth at bedtime.      Coenzyme Q10 (CO Q 10) 100 MG CAPS Take by mouth.     DENTA 5000 PLUS 1.1 % CREA dental cream See admin instructions.     famotidine (PEPCID) 20 MG tablet TAKE 1 TABLET BY MOUTH EVERYDAY AT BEDTIME 90 tablet 2   glucosamine-chondroitin 500-400 MG tablet Take 1 tablet by mouth daily with supper.      Omega-3 1000 MG CAPS Take 1 tablet by mouth daily.     pantoprazole (PROTONIX) 40 MG tablet Take by mouth.     valsartan-hydrochlorothiazide (DIOVAN-HCT) 80-12.5 MG tablet Take 1 tablet by mouth daily. 90 tablet 1   No facility-administered medications prior to visit.    Allergies  Allergen Reactions   Morphine Nausea And Vomiting   Morphine And Related Nausea And Vomiting   Cialis [Tadalafil] Other (See Comments)    Limb pain    Review of Systems     Objective:    Physical Exam Constitutional:      Appearance: He is well-developed.  HENT:     Head: Normocephalic and  atraumatic.     Right Ear: External ear normal.     Left Ear: External ear normal.     Nose: Nose normal.  Eyes:     Conjunctiva/sclera: Conjunctivae normal.  Pupils: Pupils are equal, round, and reactive to light.  Neck:     Thyroid: No thyromegaly.  Cardiovascular:     Rate and Rhythm: Normal rate.     Heart sounds: Normal heart sounds.  Pulmonary:     Effort: Pulmonary effort is normal.     Breath sounds: Normal breath sounds.  Musculoskeletal:     Cervical back: Neck supple. No tenderness.  Lymphadenopathy:     Cervical: No cervical adenopathy.  Skin:    General: Skin is warm and dry.  Neurological:     Mental Status: He is alert and oriented to person, place, and time.    BP (!) 123/57   Pulse 61   Temp 98 F (36.7 C)   SpO2 99%  Wt Readings from Last 3 Encounters:  02/08/21 179 lb 1.9 oz (81.2 kg)  01/31/21 177 lb 1.9 oz (80.3 kg)  09/26/20 177 lb (80.3 kg)    There are no preventive care reminders to display for this patient.  There are no preventive care reminders to display for this patient.   Lab Results  Component Value Date   TSH 0.92 04/17/2017   Lab Results  Component Value Date   WBC 5.6 06/16/2020   HGB 16.6 06/16/2020   HCT 48.1 06/16/2020   MCV 89.9 06/16/2020   PLT 168 06/16/2020   Lab Results  Component Value Date   NA 140 03/31/2021   K 3.6 03/31/2021   CO2 33 (H) 03/31/2021   GLUCOSE 113 (H) 03/31/2021   BUN 20 03/31/2021   CREATININE 1.09 03/31/2021   BILITOT 0.8 10/10/2020   ALKPHOS 64 05/06/2016   AST 14 10/10/2020   ALT 15 10/10/2020   PROT 6.9 10/10/2020   ALBUMIN 4.2 10/05/2013   CALCIUM 9.7 03/31/2021   ANIONGAP 11 10/23/2017   EGFR 69 03/31/2021   GFR 78.93 10/05/2013   Lab Results  Component Value Date   CHOL 132 10/10/2020   Lab Results  Component Value Date   HDL 44 10/10/2020   Lab Results  Component Value Date   LDLCALC 67 10/10/2020   Lab Results  Component Value Date   TRIG 131 10/10/2020    Lab Results  Component Value Date   CHOLHDL 3.0 10/10/2020   Lab Results  Component Value Date   HGBA1C 5.0 03/23/2019       Assessment & Plan:   Problem List Items Addressed This Visit   None Visit Diagnoses     Acute cough    -  Primary      Cough x4 to 5 days now feeling a heaviness in his chest.  No shortness of breath.  I would like to have him come into that I can listen to his chest to see if we might need to get a chest x-ray and check oxygen levels.  He has been afebrile so I think flu is less likely.  I did ask him to go ahead and do a COVID test he does have a home test.  Chest is clear on exam.  Oxygen level looks great.  We discussed that since we are getting ready to head into the holiday weekend it may be difficult to get care as we will be closed for the next 4 days that I did go ahead and send in a prescription for an antibiotic to fill only if he feels like he is suddenly getting worse.  For example cough and shortness of breath worsening or sputum.  If he  is getting worse were always happy to see him back and get a chest x-ray or can go to urgent care if needed.  Meds ordered this encounter  Medications   azithromycin (ZITHROMAX) 250 MG tablet    Sig: 2 Ttabs PO on Day 1, then one a day x 4 days.    Dispense:  6 tablet    Refill:  0     Beatrice Lecher, MD

## 2021-04-11 NOTE — Progress Notes (Signed)
Acute Office Visit  Subjective:    Patient ID: [name], male    DOB: Oct 10, 1940, 80 y.o.   MRN: [id number]  No chief complaint on file.   HPI Patient is in today for      Past Medical History:  Diagnosis Date   Arthritis    osteoarthritis right hip   Carotid artery plaque    mild   Colon polyps    Erectile dysfunction    GERD (gastroesophageal reflux disease)    history- no current problems   History of nephrolithiasis    episodes x 6    HLD (hyperlipidemia)    HTN (hypertension)    Impaired hearing    bilateral hearing aids   Sleep apnea    cpap used with nasal clip   Thyroid nodule     Past Surgical History:  Procedure Laterality Date   cataract Bilateral 05/21/2013   CYSTOSCOPY W/ STONE MANIPULATION     x 2   EYE SURGERY  cataracts for both eyes   HEMORRHOID SURGERY     INGUINAL HERNIA REPAIR Right    JOINT REPLACEMENT  2017, 2019   LAPAROSCOPIC DRAINAGE RENAL CYST     LITHOTRIPSY     x 1    TOTAL HIP ARTHROPLASTY Right 11/15/2015   Procedure: RIGHT TOTAL HIP ARTHROPLASTY ANTERIOR APPROACH;  Surgeon: Paralee Cancel, MD;  Location: [facility];  Service: Orthopedics;  Laterality: Right;   TOTAL HIP ARTHROPLASTY Left 10/22/2017   Procedure: LEFT TOTAL HIP ARTHROPLASTY ANTERIOR APPROACH;  Surgeon: Paralee Cancel, MD;  Location: [facility];  Service: Orthopedics;  Laterality: Left;    Family History  Problem Relation Age of Onset   Colon cancer Father    Cancer Father    Heart attack Mother 76   Hearing loss Mother    Heart disease Mother    Diabetes Other        1st degree relative    Social History   Socioeconomic History   Marital status: Married    Spouse name: [name]    Number of children: 3   Years of education: 18   Highest education level: Master's degree (e.g., MA, MS, MEng, MEd, MSW, MBA)  Occupational History   Occupation: Retired     Comment: Environmental consultant super intendant  Tobacco Use   Smoking status: Never   Smokeless tobacco: Never   Vaping Use   Vaping Use: Never used  Substance and Sexual Activity   Alcohol use: No   Drug use: No   Sexual activity: Yes  Other Topics Concern   Not on file  Social History Narrative   Married.  No regular exercise.  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 [employer]   Social Determinants of Health   Financial Resource Strain: Low Risk    Difficulty of Paying Living Expenses: Not hard at all  Food Insecurity: No Food Insecurity   Worried About Charity fundraiser in the Last Year: Never true   Arboriculturist in the Last Year: Never true  Transportation Needs: No Transportation Needs   Lack of Transportation (Medical): No   Lack of Transportation (Non-Medical): No  Physical Activity: Inactive   Days of Exercise per Week: 0 days   Minutes of Exercise per Session: 0 min  Stress:  No Stress Concern Present   Feeling of Stress : Not at all  Social Connections: Socially Integrated   Frequency of Communication with Friends and Family: More than three times a week   Frequency of Social Gatherings with Friends and Family: Never   Attends Religious Services: More than 4 times per year   Active Member of [employer] or Organizations: Yes   Attends Music therapist: More than 4 times per year   Marital Status: Married  Human resources officer Violence: Not At Risk   Fear of Current or Ex-Partner: No   Emotionally Abused: No   Physically Abused: No   Sexually Abused: No    Outpatient Medications Prior to Visit  Medication Sig Dispense Refill   ascorbic acid (VITAMIN C) 1000 MG tablet Take 1,000 mg by mouth daily with supper.      aspirin EC 81 MG tablet Take 81 mg by mouth daily.     atorvastatin (LIPITOR) 40 MG tablet Take 1 tablet (40 mg total) by mouth daily. 90 tablet 3    Cholecalciferol (VITAMIN D3) 2000 units TABS Take 2,000 Units by mouth at bedtime.      Coenzyme Q10 (CO Q 10) 100 MG CAPS Take by mouth.     DENTA 5000 PLUS 1.1 % CREA dental cream See admin instructions.     famotidine (PEPCID) 20 MG tablet TAKE 1 TABLET BY MOUTH EVERYDAY AT BEDTIME 90 tablet 2   glucosamine-chondroitin 500-400 MG tablet Take 1 tablet by mouth daily with supper.      Omega-3 1000 MG CAPS Take 1 tablet by mouth daily.     pantoprazole (PROTONIX) 40 MG tablet Take by mouth.     valsartan-hydrochlorothiazide (DIOVAN-HCT) 80-12.5 MG tablet Take 1 tablet by mouth daily. 90 tablet 1   No facility-administered medications prior to visit.    Allergies  Allergen Reactions   Morphine Nausea And Vomiting   Morphine And Related Nausea And Vomiting   Cialis [Tadalafil] Other (See Comments)    Limb pain    Review of Systems     Objective:    Physical Exam  There were no vitals taken for this visit. Wt Readings from Last 3 Encounters:  02/08/21 179 lb 1.9 oz (81.2 kg)  01/31/21 177 lb 1.9 oz (80.3 kg)  09/26/20 177 lb (80.3 kg)    There are no preventive care reminders to display for this patient.  There are no preventive care reminders to display for this patient.   Lab Results  Component Value Date   TSH 0.92 04/17/2017   Lab Results  Component Value Date   WBC 5.6 06/16/2020   HGB 16.6 06/16/2020   HCT 48.1 06/16/2020   MCV 89.9 06/16/2020   PLT 168 06/16/2020   Lab Results  Component Value Date   NA 140 03/31/2021   K 3.6 03/31/2021   CO2 33 (H) 03/31/2021   GLUCOSE 113 (H) 03/31/2021   BUN 20 03/31/2021   CREATININE 1.09 03/31/2021   BILITOT 0.8 10/10/2020   ALKPHOS 64 05/06/2016   AST 14 10/10/2020   ALT 15 10/10/2020   PROT 6.9 10/10/2020   ALBUMIN 4.2 10/05/2013   CALCIUM 9.7 03/31/2021   ANIONGAP 11 10/23/2017   EGFR 69 03/31/2021   GFR 78.93 10/05/2013   Lab Results  Component Value Date   CHOL 132 10/10/2020   Lab Results   Component Value Date   HDL 44 10/10/2020   Lab Results  Component Value Date   LDLCALC 67  10/10/2020   Lab Results  Component Value Date   TRIG 131 10/10/2020   Lab Results  Component Value Date   CHOLHDL 3.0 10/10/2020   Lab Results  Component Value Date   HGBA1C 5.0 03/23/2019       Assessment & Plan:   Problem List Items Addressed This Visit   None Visit Diagnoses     Acute cough    -  Primary        No orders of the defined types were placed in this encounter.    Beatrice Lecher, MD

## 2021-04-18 ENCOUNTER — Other Ambulatory Visit: Payer: Self-pay | Admitting: *Deleted

## 2021-04-18 MED ORDER — PANTOPRAZOLE SODIUM 40 MG PO TBEC: 40.0000 mg | DELAYED_RELEASE_TABLET | Freq: Every day | ORAL | 3 refills | Status: DC

## 2021-04-21 ENCOUNTER — Telehealth: Payer: Self-pay

## 2021-04-21 NOTE — Telephone Encounter (Signed)
Pt's spouse called in stating that pt had an allergic reaction to "something" and his eyes became puffy.  No other symptoms such as hives, throat closing, tongue swelling, etc.  She give him Benadryl and initially put warm compresses on his eyes, but then someone told her to use cold compresses, which she did.   Spouse states this morning that cold compresses did not help so she went back to warm compresses, which helped some.  She did give him 2 more Benadryl before bedtime.  This morning his eyes are still swollen, but have gone down significantly.  Should pt be seen in our office today, have referral for allergy testing, continue Benadryl today, etc?  Please advise.  Charyl Bigger, CMA

## 2021-04-21 NOTE — Telephone Encounter (Signed)
Okay to use the Benadryl again today or switch to a longer acting antihistamine such as Claritin, Zyrtec or Allegra it will just last longer during the day and continue with the compresses if they are helping.  Not sure what may have caused it.  Encouraged him to just think about potential exposures.  If he was around animals that he is not usually around or soaps lotions detergents shampoos etc. that are new?  If he is not having any eye pain or vision change but I do not think he needs to be seen I think it is just mostly symptomatic treatment.

## 2021-04-21 NOTE — Telephone Encounter (Signed)
Spoke with pt's spouse and informed her of Dr. Gardiner Ramus recommendations.  Spouse expressed understanding and stated that pt has not been around anything new.  Advised to call back if any further questions or concerns.  Charyl Bigger, CMA

## 2021-05-27 DIAGNOSIS — U071 COVID-19: Secondary | ICD-10-CM | POA: Diagnosis not present

## 2021-05-30 DIAGNOSIS — R972 Elevated prostate specific antigen [PSA]: Secondary | ICD-10-CM | POA: Diagnosis not present

## 2021-06-06 DIAGNOSIS — R350 Frequency of micturition: Secondary | ICD-10-CM | POA: Diagnosis not present

## 2021-06-06 DIAGNOSIS — R3915 Urgency of urination: Secondary | ICD-10-CM | POA: Diagnosis not present

## 2021-06-06 DIAGNOSIS — N401 Enlarged prostate with lower urinary tract symptoms: Secondary | ICD-10-CM | POA: Diagnosis not present

## 2021-06-06 DIAGNOSIS — N5201 Erectile dysfunction due to arterial insufficiency: Secondary | ICD-10-CM | POA: Diagnosis not present

## 2021-06-08 DIAGNOSIS — M25511 Pain in right shoulder: Secondary | ICD-10-CM | POA: Diagnosis not present

## 2021-06-29 DIAGNOSIS — M25511 Pain in right shoulder: Secondary | ICD-10-CM | POA: Diagnosis not present

## 2021-07-01 ENCOUNTER — Other Ambulatory Visit: Payer: Self-pay | Admitting: Family Medicine

## 2021-07-10 DIAGNOSIS — M25511 Pain in right shoulder: Secondary | ICD-10-CM | POA: Diagnosis not present

## 2021-07-18 DIAGNOSIS — M25511 Pain in right shoulder: Secondary | ICD-10-CM | POA: Diagnosis not present

## 2021-07-31 ENCOUNTER — Other Ambulatory Visit: Payer: Self-pay

## 2021-07-31 ENCOUNTER — Encounter: Payer: Self-pay | Admitting: Family Medicine

## 2021-07-31 ENCOUNTER — Ambulatory Visit (INDEPENDENT_AMBULATORY_CARE_PROVIDER_SITE_OTHER): Payer: Medicare Other | Admitting: Family Medicine

## 2021-07-31 VITALS — BP 130/54 | HR 68 | Resp 16 | Ht 66.0 in | Wt 176.0 lb

## 2021-07-31 DIAGNOSIS — W010XXA Fall on same level from slipping, tripping and stumbling without subsequent striking against object, initial encounter: Secondary | ICD-10-CM | POA: Diagnosis not present

## 2021-07-31 DIAGNOSIS — M25511 Pain in right shoulder: Secondary | ICD-10-CM | POA: Insufficient documentation

## 2021-07-31 DIAGNOSIS — E041 Nontoxic single thyroid nodule: Secondary | ICD-10-CM

## 2021-07-31 DIAGNOSIS — I10 Essential (primary) hypertension: Secondary | ICD-10-CM | POA: Diagnosis not present

## 2021-07-31 NOTE — Assessment & Plan Note (Signed)
Due for repeat US, has been one year, to f/u on multiple noduels.   ?

## 2021-07-31 NOTE — Progress Notes (Signed)
Established Patient Office Visit  Subjective:  Patient ID: [name], male    DOB: 08-31-1940  Age: 81 y.o. MRN: [id number]  CC:  Chief Complaint  Patient presents with   Hypertension    Follow up     HPI [name] presents for   Hypertension- Pt denies chest pain, SOB, dizziness, or heart palpitations.  Taking meds as directed w/o problems.  Denies medication side effects.    Due for f/U 1 yr [place] On thyroid.    Still having problems with Right shoulder.  He has been seeing the orthopedist he has been having problems on and off for probably about a year and a half.  Initially he had an injection and got significant pain relief for about 6 months.  He then went back and had another injection but it only lasted for about 2 weeks.  They ended up doing an MRI which she reports showed bursitis, arthritis, and a small rotator cuff tear and a pocket of fluid.  They are currently trying to see if his insurance would cover having that little pocket of fluid drained to see if it is helpful he is also been doing some home exercises.  They did refer him to formal PT he does have pretty good range of motion and strength.  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  He says his neck felt a little sore for a couple of days but that has resolved and he has not noticed any increased pain or worsening of that right shoulder pain.  Past Medical History:  Diagnosis Date   Arthritis    osteoarthritis right hip   Carotid artery plaque    mild   Colon polyps    Erectile dysfunction    GERD (gastroesophageal reflux disease)    history- no current problems   History of nephrolithiasis    episodes x 6    HLD (hyperlipidemia)    HTN (hypertension)    Impaired hearing    bilateral hearing aids   Sleep apnea    cpap used with nasal clip   Thyroid nodule     Past  Surgical History:  Procedure Laterality Date   cataract Bilateral 05/21/2013   CYSTOSCOPY W/ STONE MANIPULATION     x 2   EYE SURGERY  cataracts for both eyes   HEMORRHOID SURGERY     INGUINAL HERNIA REPAIR Right    JOINT REPLACEMENT  2017, 2019   LAPAROSCOPIC DRAINAGE RENAL CYST     LITHOTRIPSY     x 1    TOTAL HIP ARTHROPLASTY Right 11/15/2015   Procedure: RIGHT TOTAL HIP ARTHROPLASTY ANTERIOR APPROACH;  Surgeon: Paralee Cancel, MD;  Location: [facility];  Service: Orthopedics;  Laterality: Right;   TOTAL HIP ARTHROPLASTY Left 10/22/2017   Procedure: LEFT TOTAL HIP ARTHROPLASTY ANTERIOR APPROACH;  Surgeon: Paralee Cancel, MD;  Location: [facility];  Service: Orthopedics;  Laterality: Left;    Family History  Problem Relation Age of Onset   Colon cancer Father    Cancer Father    Heart attack Mother 71   Hearing loss Mother    Heart disease Mother    Diabetes Other        1st degree relative    Social History   Socioeconomic History   Marital status: Married    Spouse name:  [name]    Number of children: 3   Years of education: 20   Highest education level: Master's degree (e.g., MA, MS, MEng, MEd, MSW, MBA)  Occupational History   Occupation: Retired     Comment: Hydrologist  Tobacco Use   Smoking status: Never   Smokeless tobacco: Never  Vaping Use   Vaping Use: Never used  Substance and Sexual Activity   Alcohol use: No   Drug use: No   Sexual activity: Yes  Other Topics Concern   Not on file  Social History Narrative   Married.  No regular exercise.  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 [employer]   Social Determinants of Health   Financial Resource Strain: Low Risk    Difficulty of Paying Living Expenses: Not hard at all  Food Insecurity: No Food  Insecurity   Worried About Charity fundraiser in the Last Year: Never true   Arboriculturist in the Last Year: Never true  Transportation Needs: No Transportation Needs   Lack of Transportation (Medical): No   Lack of Transportation (Non-Medical): No  Physical Activity: Inactive   Days of Exercise per Week: 0 days   Minutes of Exercise per Session: 0 min  Stress: No Stress Concern Present   Feeling of Stress : Not at all  Social Connections: Socially Integrated   Frequency of Communication with Friends and Family: More than three times a week   Frequency of Social Gatherings with Friends and Family: Never   Attends Religious Services: More than 4 times per year   Active Member of [employer] or Organizations: Yes   Attends Music therapist: More than 4 times per year   Marital Status: Married  Human resources officer Violence: Not At Risk   Fear of Current or Ex-Partner: No   Emotionally Abused: No   Physically Abused: No   Sexually Abused: No    Outpatient Medications Prior to Visit  Medication Sig Dispense Refill   ascorbic acid (VITAMIN C) 1000 MG tablet Take 1,000 mg by mouth daily with supper.      aspirin EC 81 MG tablet Take 81 mg by mouth daily.     atorvastatin (LIPITOR) 40 MG tablet Take 1 tablet (40 mg total) by mouth daily. 90 tablet 3   Cholecalciferol (VITAMIN D3) 2000 units TABS Take 2,000 Units by mouth at bedtime.      Coenzyme Q10 (CO Q 10) 100 MG CAPS Take by mouth.     DENTA 5000 PLUS 1.1 % CREA dental cream See admin instructions.     famotidine (PEPCID) 20 MG tablet TAKE 1 TABLET BY MOUTH EVERYDAY AT BEDTIME 90 tablet 2   glucosamine-chondroitin 500-400 MG tablet Take 1 tablet by mouth daily with supper.      Omega-3 1000 MG CAPS Take 1 tablet by mouth daily.     pantoprazole (PROTONIX) 40 MG tablet Take 1 tablet (40 mg total) by mouth daily. 90 tablet 3   valsartan-hydrochlorothiazide (DIOVAN-HCT) 80-12.5 MG tablet TAKE 1 TABLET BY MOUTH EVERY DAY 90  tablet 0   No facility-administered medications prior to visit.    Allergies  Allergen Reactions   Morphine Nausea And Vomiting   Morphine And Related Nausea And Vomiting   Cialis [Tadalafil] Other (See Comments)  Limb pain    ROS Review of Systems    Objective:    Physical Exam Constitutional:      Appearance: Normal appearance. He is well-developed.  HENT:     Head: Normocephalic and atraumatic.  Cardiovascular:     Rate and Rhythm: Normal rate and regular rhythm.     Heart sounds: Normal heart sounds.  Pulmonary:     Effort: Pulmonary effort is normal.     Breath sounds: Normal breath sounds.  Musculoskeletal:     Comments: Normal extension.  Decreased external rotation significantly compared to his left side.  He is able to reach to his low back beltline.  But has a little bit more trouble with that right shoulder compared to the left  Skin:    General: Skin is warm and dry.  Neurological:     Mental Status: He is alert and oriented to person, place, and time. Mental status is at baseline.  Psychiatric:        Behavior: Behavior normal.    BP (!) 130/54    Pulse 68    Resp 16    Ht 5' 6"  (1.676 m)    Wt 176 lb (79.8 kg)    SpO2 96%    BMI 28.41 kg/m  Wt Readings from Last 3 Encounters:  07/31/21 176 lb (79.8 kg)  02/08/21 179 lb 1.9 oz (81.2 kg)  01/31/21 177 lb 1.9 oz (80.3 kg)     Health Maintenance Due  Topic Date Due   Zoster Vaccines- Shingrix (2 of 2) 04/19/2021    There are no preventive care reminders to display for this patient.  Lab Results  Component Value Date   TSH 0.92 04/17/2017   Lab Results  Component Value Date   WBC 5.6 06/16/2020   HGB 16.6 06/16/2020   HCT 48.1 06/16/2020   MCV 89.9 06/16/2020   PLT 168 06/16/2020   Lab Results  Component Value Date   NA 140 03/31/2021   K 3.6 03/31/2021   CO2 33 (H) 03/31/2021   GLUCOSE 113 (H) 03/31/2021   BUN 20 03/31/2021   CREATININE 1.09 03/31/2021   BILITOT 0.8 10/10/2020    ALKPHOS 64 05/06/2016   AST 14 10/10/2020   ALT 15 10/10/2020   PROT 6.9 10/10/2020   ALBUMIN 4.2 10/05/2013   CALCIUM 9.7 03/31/2021   ANIONGAP 11 10/23/2017   EGFR 69 03/31/2021   GFR 78.93 10/05/2013   Lab Results  Component Value Date   CHOL 132 10/10/2020   Lab Results  Component Value Date   HDL 44 10/10/2020   Lab Results  Component Value Date   LDLCALC 67 10/10/2020   Lab Results  Component Value Date   TRIG 131 10/10/2020   Lab Results  Component Value Date   CHOLHDL 3.0 10/10/2020   Lab Results  Component Value Date   HGBA1C 5.0 03/23/2019      Assessment & Plan:   Problem List Items Addressed This Visit       Cardiovascular and Mediastinum   Essential hypertension - Primary    Well controlled. Continue current regimen. Follow up in  6 mo       Relevant Orders   COMPLETE METABOLIC PANEL WITH GFR     Endocrine   Thyroid nodule    Due for repeat US, has been one year, to f/u on multiple noduels.        Relevant Orders   US THYROID     Other  Acute pain of right shoulder    Currently following with Ortho recently had an MRI.  Working on getting approved for an injection to remove some pocket of fluid in that shoulder he does have significantly decreased external rotation of that shoulder.  Working with formal physical therapy.      Other Visit Diagnoses     Fall on same level from slipping, tripping or stumbling, initial encounter           No orders of the defined types were placed in this encounter.   Follow-up: Return in about 6 months (around 01/31/2022).    Beatrice Lecher, MD

## 2021-07-31 NOTE — Assessment & Plan Note (Signed)
Currently following with Ortho recently had an MRI.  Working on getting approved for an injection to remove some pocket of fluid in that shoulder he does have significantly decreased external rotation of that shoulder.  Working with formal physical therapy. ?

## 2021-07-31 NOTE — Assessment & Plan Note (Signed)
Well controlled. Continue current regimen. Follow up in  6 mo  

## 2021-08-01 ENCOUNTER — Telehealth: Payer: Self-pay | Admitting: Family Medicine

## 2021-08-01 LAB — COMPLETE METABOLIC PANEL WITH GFR
AG Ratio: 1.8 (calc) (ref 1.0–2.5)
ALT: 15 U/L (ref 9–46)
AST: 15 U/L (ref 10–35)
Albumin: 4.4 g/dL (ref 3.6–5.1)
Alkaline phosphatase (APISO): 66 U/L (ref 35–144)
BUN: 22 mg/dL (ref 7–25)
CO2: 36 mmol/L — ABNORMAL HIGH (ref 20–32)
Calcium: 10.1 mg/dL (ref 8.6–10.3)
Chloride: 98 mmol/L (ref 98–110)
Creat: 1.06 mg/dL (ref 0.70–1.22)
Globulin: 2.4 g/dL (calc) (ref 1.9–3.7)
Glucose, Bld: 118 mg/dL — ABNORMAL HIGH (ref 65–99)
Potassium: 3.8 mmol/L (ref 3.5–5.3)
Sodium: 140 mmol/L (ref 135–146)
Total Bilirubin: 1 mg/dL (ref 0.2–1.2)
Total Protein: 6.8 g/dL (ref 6.1–8.1)
eGFR: 71 mL/min/{1.73_m2} (ref >=60)

## 2021-08-01 NOTE — Progress Notes (Signed)
Your lab work is within acceptable range and there are no concerning findings.   ?

## 2021-08-01 NOTE — Telephone Encounter (Signed)
Reviewed and placed to scan ?

## 2021-08-01 NOTE — Telephone Encounter (Signed)
Patient dropped off envelope containing test results for PCP. Envelope in providers/CMA box - lmr. ?

## 2021-08-08 ENCOUNTER — Other Ambulatory Visit: Payer: Self-pay

## 2021-08-08 ENCOUNTER — Ambulatory Visit (INDEPENDENT_AMBULATORY_CARE_PROVIDER_SITE_OTHER): Payer: Medicare Other

## 2021-08-08 DIAGNOSIS — E041 Nontoxic single thyroid nodule: Secondary | ICD-10-CM

## 2021-08-08 DIAGNOSIS — E042 Nontoxic multinodular goiter: Secondary | ICD-10-CM | POA: Diagnosis not present

## 2021-08-09 NOTE — Progress Notes (Signed)
Hi [name], the ultrasound of your thyroid gland shows stable nodules.  They are still recommending that we follow this yearly.  So plan to do another ultrasound in March next year.  But no new or enlarging nodules which is extremely reassuring.

## 2021-09-04 IMAGING — DX DG TOE 2ND 2+V*L*
3 series · 3 of 3 positions shown · non-contrast
Comparison: None.

CLINICAL DATA: 79-year-old male with 2nd toe pain for 2 weeks.
Swelling and abnormal coloration. No known injury.

EXAM:
LEFT SECOND TOE

[toe ap]
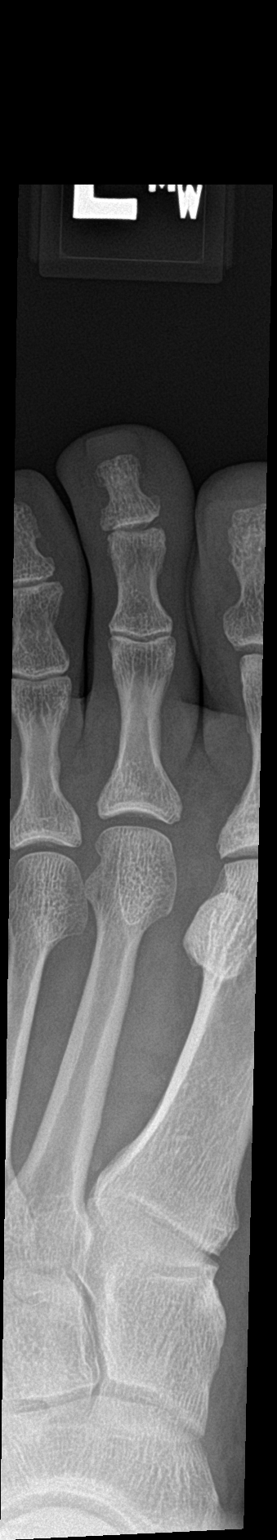

[toe obl]
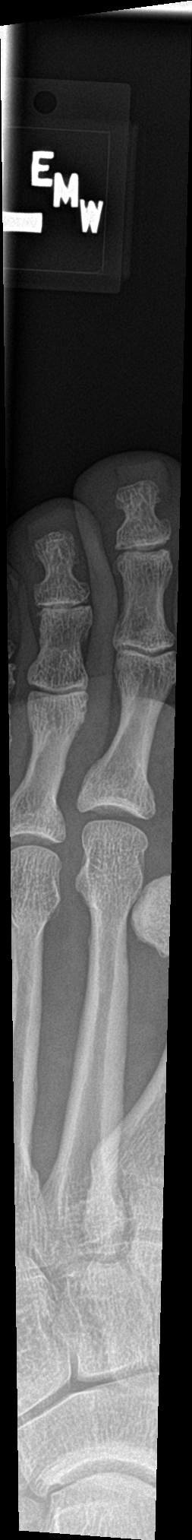

[toe lat]
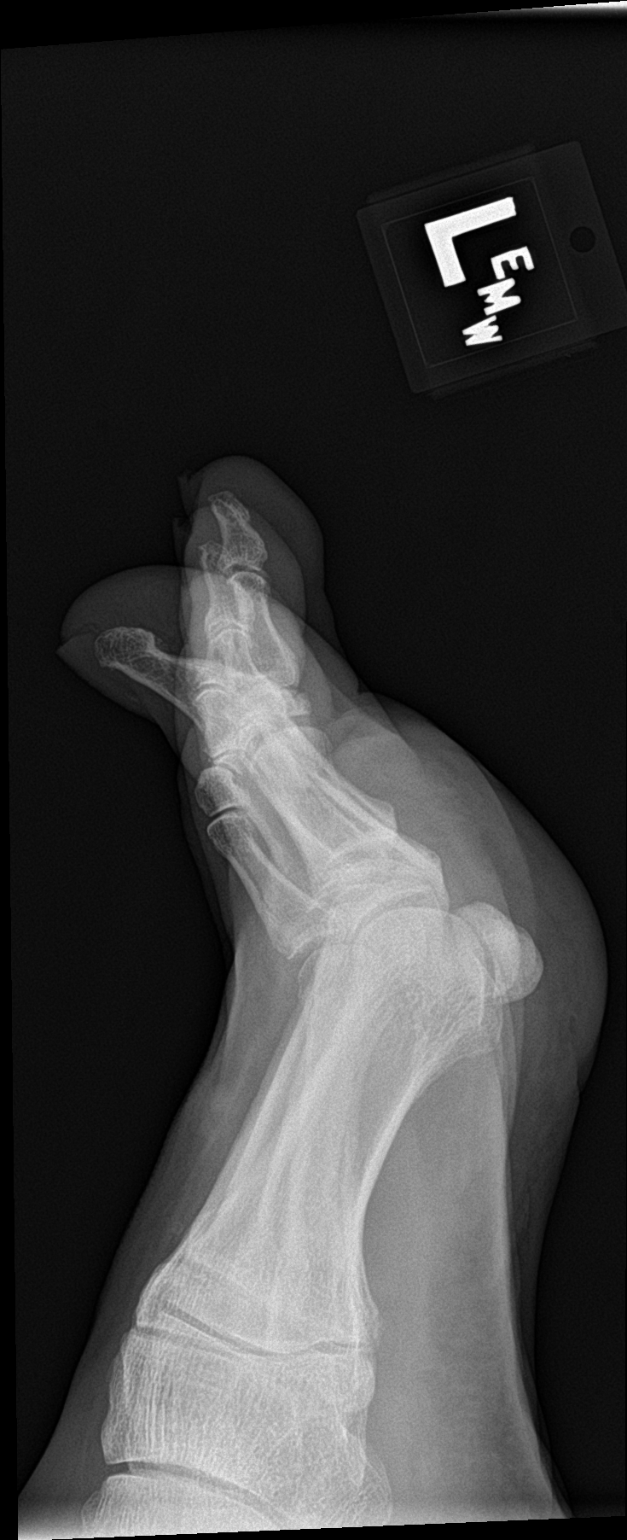

[3 of 3 positions shown; findings below may reference images not displayed]

FINDINGS: Bone mineralization is within normal limits. Visible joint spaces
and alignment are normal for age. No osseous abnormality identified.
Unremarkable visible soft tissues.
IMPRESSION: Negative.

## 2021-09-12 DIAGNOSIS — H524 Presbyopia: Secondary | ICD-10-CM | POA: Diagnosis not present

## 2021-09-12 DIAGNOSIS — H43813 Vitreous degeneration, bilateral: Secondary | ICD-10-CM | POA: Diagnosis not present

## 2021-09-12 DIAGNOSIS — H18603 Keratoconus, unspecified, bilateral: Secondary | ICD-10-CM | POA: Diagnosis not present

## 2021-09-12 DIAGNOSIS — D3132 Benign neoplasm of left choroid: Secondary | ICD-10-CM | POA: Diagnosis not present

## 2021-09-15 ENCOUNTER — Other Ambulatory Visit: Payer: Self-pay | Admitting: Family Medicine

## 2021-09-15 DIAGNOSIS — K2100 Gastro-esophageal reflux disease with esophagitis, without bleeding: Secondary | ICD-10-CM

## 2021-10-04 DIAGNOSIS — H903 Sensorineural hearing loss, bilateral: Secondary | ICD-10-CM | POA: Diagnosis not present

## 2021-10-09 DIAGNOSIS — M25511 Pain in right shoulder: Secondary | ICD-10-CM | POA: Diagnosis not present

## 2021-10-27 ENCOUNTER — Encounter: Payer: Self-pay | Admitting: Family Medicine

## 2021-10-27 ENCOUNTER — Ambulatory Visit (INDEPENDENT_AMBULATORY_CARE_PROVIDER_SITE_OTHER): Payer: Medicare Other | Admitting: Family Medicine

## 2021-10-27 ENCOUNTER — Ambulatory Visit (INDEPENDENT_AMBULATORY_CARE_PROVIDER_SITE_OTHER): Payer: Medicare Other

## 2021-10-27 VITALS — BP 125/60 | HR 60 | Resp 16 | Ht 66.0 in | Wt 175.0 lb

## 2021-10-27 DIAGNOSIS — R0781 Pleurodynia: Secondary | ICD-10-CM

## 2021-10-27 DIAGNOSIS — M546 Pain in thoracic spine: Secondary | ICD-10-CM

## 2021-10-27 NOTE — Progress Notes (Unsigned)
   Acute Office Visit  Subjective:     Patient ID: [name], male    DOB: Oct 20, 1940, 81 y.o.   MRN: [id number]  Chief Complaint  Patient presents with   Flank Pain    Right side of abdomen for 1 month. Patient states the pain is getting worse.    Lump on neck    Right side of neck, increased in size     HPI Patient is in today for right-sided pain above his hip.  He says that he wanted to give a little bit of back story.  Actually when I saw him in March she had recently just fallen and landed on his right side he tripped over some cords and wires at work.  At the time he felt like it was manageable and felt like he was getting better.  Then a week or so later around [place] he stayed with family and had to sleep in a pretty uncomfortable bed and sort of an awkward position.  And then about a month ago he was bending over to pick up some clippers and felt a sudden sharp pain in his right side he points the area between his right hip bone and the area below the axilla.  He says ever since then it is hurt almost every day with a throbbing type pain.  Sometimes it is uncomfortable at night and he has to move to get a little relief.  He has been trying to exercise by walking 2 to 4 miles a week.  But says sometimes it almost feels worse after that.  He also wants me to recheck the cyst on the right side of his neck he feels like it is getting a little bit larger but otherwise not bothersome.  ROS      Objective:    BP 125/60   Pulse 60   Resp 16   Ht '5\' 6"'$  (1.676 m)   Wt 175 lb (79.4 kg)   SpO2 100%   BMI 28.25 kg/m  {Vitals History (Optional):23777}  Physical Exam  No results found for any visits on 10/27/21.      Assessment & Plan:   Problem List Items Addressed This Visit   None Visit Diagnoses     Acute right-sided thoracic back pain    -  Primary   Relevant Orders   DG Ribs Unilateral W/Chest Right   CBC with Differential/Platelet   COMPLETE METABOLIC PANEL  WITH GFR   Urine Microalbumin w/creat. ratio   Rib pain on right side       Relevant Orders   DG Ribs Unilateral W/Chest Right   CBC with Differential/Platelet   COMPLETE METABOLIC PANEL WITH GFR   Urine Microalbumin w/creat. ratio      We discussed initial work-up to include x-ray just to evaluate for possible rib fracture and to take a look at the lung on that side.  We also do labs to make sure that liver function and kidney function are stable.  If everything looks reassuring and we cannot find a specific cause then consider CT for further work-up.  Also consider that it could be coming from his spine even though it seems to be skipping the posterior area it could be radiating to the side.   No orders of the defined types were placed in this encounter.   No follow-ups on file.  Beatrice Lecher, MD

## 2021-10-28 LAB — CBC WITH DIFFERENTIAL/PLATELET
Absolute Monocytes: 308 cells/uL (ref 200–950)
Basophils Absolute: 31 cells/uL (ref 0–200)
Basophils Relative: 0.4 %
Eosinophils Absolute: 169 cells/uL (ref 15–500)
Eosinophils Relative: 2.2 %
HCT: 50.7 % — ABNORMAL HIGH (ref 38.5–50.0)
Hemoglobin: 17.6 g/dL — ABNORMAL HIGH (ref 13.2–17.1)
Lymphs Abs: 1278 cells/uL (ref 850–3900)
MCH: 31.8 pg (ref 27.0–33.0)
MCHC: 34.7 g/dL (ref 32.0–36.0)
MCV: 91.5 fL (ref 80.0–100.0)
MPV: 10.4 fL (ref 7.5–12.5)
Monocytes Relative: 4 %
Neutro Abs: 5914 cells/uL (ref 1500–7800)
Neutrophils Relative %: 76.8 %
Platelets: 160 10*3/uL (ref 140–400)
RBC: 5.54 10*6/uL (ref 4.20–5.80)
RDW: 13 % (ref 11.0–15.0)
Total Lymphocyte: 16.6 %
WBC: 7.7 10*3/uL (ref 3.8–10.8)

## 2021-10-28 LAB — COMPLETE METABOLIC PANEL WITH GFR
AG Ratio: 1.9 (calc) (ref 1.0–2.5)
ALT: 16 U/L (ref 9–46)
AST: 18 U/L (ref 10–35)
Albumin: 4.5 g/dL (ref 3.6–5.1)
Alkaline phosphatase (APISO): 59 U/L (ref 35–144)
BUN/Creatinine Ratio: 24 (calc) — ABNORMAL HIGH (ref 6–22)
BUN: 26 mg/dL — ABNORMAL HIGH (ref 7–25)
CO2: 35 mmol/L — ABNORMAL HIGH (ref 20–32)
Calcium: 10 mg/dL (ref 8.6–10.3)
Chloride: 98 mmol/L (ref 98–110)
Creat: 1.07 mg/dL (ref 0.70–1.22)
Globulin: 2.4 g/dL (calc) (ref 1.9–3.7)
Glucose, Bld: 93 mg/dL (ref 65–139)
Potassium: 3.8 mmol/L (ref 3.5–5.3)
Sodium: 141 mmol/L (ref 135–146)
Total Bilirubin: 1 mg/dL (ref 0.2–1.2)
Total Protein: 6.9 g/dL (ref 6.1–8.1)
eGFR: 70 mL/min/{1.73_m2} (ref >=60)

## 2021-10-28 LAB — MICROALBUMIN / CREATININE URINE RATIO
Creatinine, Urine: 85 mg/dL (ref 20–320)
Microalb Creat Ratio: 39 mcg/mg creat — ABNORMAL HIGH (ref <30)
Microalb, Ur: 3.3 mg/dL

## 2021-10-30 ENCOUNTER — Encounter: Payer: Self-pay | Admitting: Family Medicine

## 2021-10-30 DIAGNOSIS — R0781 Pleurodynia: Secondary | ICD-10-CM

## 2021-10-30 DIAGNOSIS — R93429 Abnormal radiologic findings on diagnostic imaging of unspecified kidney: Secondary | ICD-10-CM

## 2021-10-30 DIAGNOSIS — M546 Pain in thoracic spine: Secondary | ICD-10-CM

## 2021-10-30 NOTE — Telephone Encounter (Signed)
Ct ordered. Changed to abd to cover kidneys.  Also schedule with Dr. Darene Lamer for seb cyst removal from the right side of her neck.

## 2021-10-30 NOTE — Progress Notes (Signed)
Hi [name], you look a little bit dry on your blood work.  The hemoglobin was just slightly elevated but again I think that is because you are little bit more dry.  Other electrolytes look normal.  You do have a little extra protein in the urine.  You are already on valsartan which helps protect the kidneys.  So please just continue this regimen.

## 2021-10-30 NOTE — Progress Notes (Signed)
Hi [name], no sign of acute fracture which is good.  I know we had discussed getting possible chest CT for further work-up if you are still having discomfort.  If you would like to move forward then please let me know and we can do so.

## 2021-11-06 ENCOUNTER — Ambulatory Visit (INDEPENDENT_AMBULATORY_CARE_PROVIDER_SITE_OTHER): Payer: Medicare Other

## 2021-11-06 DIAGNOSIS — R109 Unspecified abdominal pain: Secondary | ICD-10-CM

## 2021-11-06 DIAGNOSIS — R0781 Pleurodynia: Secondary | ICD-10-CM | POA: Diagnosis not present

## 2021-11-06 DIAGNOSIS — I700 Atherosclerosis of aorta: Secondary | ICD-10-CM | POA: Diagnosis not present

## 2021-11-06 DIAGNOSIS — N2889 Other specified disorders of kidney and ureter: Secondary | ICD-10-CM | POA: Diagnosis not present

## 2021-11-06 DIAGNOSIS — R93429 Abnormal radiologic findings on diagnostic imaging of unspecified kidney: Secondary | ICD-10-CM | POA: Diagnosis not present

## 2021-11-06 DIAGNOSIS — M546 Pain in thoracic spine: Secondary | ICD-10-CM

## 2021-11-06 DIAGNOSIS — N281 Cyst of kidney, acquired: Secondary | ICD-10-CM | POA: Diagnosis not present

## 2021-11-06 MED ORDER — IOHEXOL 300 MG/ML  SOLN
100.0000 mL | Freq: Once | INTRAMUSCULAR | Status: AC | PRN
  Administered 2021-11-06: 100 mL via INTRAVENOUS

## 2021-11-07 NOTE — Progress Notes (Signed)
Dr. Jerilynn Mages is out of office for today.   No acute changes seen on CT of abdomen. 2cm calcified mass is stable from 2009 CT in the right kidney but not obstructing.   You do have some aortic plaque build up. Continue to take lipitor.   Are you still having pain?   Iran Planas PA-C

## 2021-11-10 ENCOUNTER — Encounter: Payer: Self-pay | Admitting: Family Medicine

## 2021-11-11 ENCOUNTER — Other Ambulatory Visit: Payer: Self-pay | Admitting: Family Medicine

## 2021-11-23 ENCOUNTER — Encounter: Payer: Self-pay | Admitting: Family Medicine

## 2021-11-23 ENCOUNTER — Ambulatory Visit (INDEPENDENT_AMBULATORY_CARE_PROVIDER_SITE_OTHER): Payer: Medicare Other | Admitting: Family Medicine

## 2021-11-23 VITALS — BP 132/63 | HR 64 | Resp 16 | Ht 66.0 in | Wt 175.0 lb

## 2021-11-23 DIAGNOSIS — M79675 Pain in left toe(s): Secondary | ICD-10-CM | POA: Diagnosis not present

## 2021-11-23 MED ORDER — PREDNISONE 20 MG PO TABS: ORAL_TABLET | ORAL | 0 refills | Status: AC

## 2021-11-23 NOTE — Progress Notes (Addendum)
   Acute Office Visit  Subjective:     Patient ID: [name], male    DOB: November 23, 1940, 81 y.o.   MRN: [id number]  Chief Complaint  Patient presents with   Toe Concern    Patient states his second toe on left foot is painful and swollen/3 days. No known injury     HPI Patient is in today for painful, red and swollen 2nd toe on left foot.  Describes the pain as throbbing.  Has had similar sxs on 01/2020.  It is the exact same toe and he says it feels just like it did then.  In looking back through the notes he had actually clipped his toenail and then a couple days later it became red and swollen.  He went to urgent care and they thought that it might be an infection so they gave him doxycycline.  It never got better so he ended up seeing me.  We did a plain film which was negative and did labs.  His uric acid level was elevated at 8.  We decided to do a round of prednisone and within a few days he got pretty significant symptomatic relief.  He is not had any problem with the toe until about 3 days ago and it started become red swollen and painful again. No trauma or injury.   ROS      Objective:    BP 132/63   Pulse 64   Resp 16   Ht '5\' 6"'$  (1.676 m)   Wt 175 lb (79.4 kg)   SpO2 96%   BMI 28.25 kg/m    Physical Exam Musculoskeletal:     Comments: Second toe on left foot is very tender over the DIP and PIP joint with some erythema and swelling.     Results for orders placed or performed in visit on 11/23/21  Uric acid  Result Value Ref Range   Uric Acid, Serum 9.1 (H) 4.0 - 8.0 mg/dL        Assessment & Plan:   Problem List Items Addressed This Visit   None Visit Diagnoses     Pain of toe of left foot    -  Primary   Relevant Orders   Uric acid (Completed)      Pain of second toe-based on previous records I do think this could be a gout flare.  Recommend repeat uric acid level today and will treat with another round of prednisone.  If this is helpful in the  future we can always treat with colchicine we did discuss pros and cons of treatment with allopurinol.  He is more interested in working on dietary changes to reduce uric acid levels if it still elevated. I think will be elevated.   Meds ordered this encounter  Medications   predniSONE (DELTASONE) 20 MG tablet    Sig: Take 2 tablets (40 mg total) by mouth daily with breakfast for 4 days, THEN 1 tablet (20 mg total) daily with breakfast for 4 days, THEN 0.5 tablets (10 mg total) daily with breakfast for 2 days.    Dispense:  13 tablet    Refill:  0    No follow-ups on file.  Beatrice Lecher, MD

## 2021-11-24 ENCOUNTER — Encounter: Payer: Self-pay | Admitting: Family Medicine

## 2021-11-24 LAB — URIC ACID: Uric Acid, Serum: 9.1 mg/dL — ABNORMAL HIGH (ref 4.0–8.0)

## 2021-11-24 NOTE — Progress Notes (Signed)
Hi [name], the uric acid came back even higher this time.  I would definitely encourage you to work on a low purine diet.  WebMD is a pretty good the reliable source in regards to dietary measures for gout.  If you feel like you need any additional information then let me know.  But I would say lets recheck the uric acid level this fall maybe in 3 to 4 months to see if you are able to get it down and that should hopefully reduce potential gout flares.  Hopefully the prednisone will work pretty quickly over the weekend to relieve your pain.

## 2021-11-30 ENCOUNTER — Other Ambulatory Visit: Payer: Self-pay | Admitting: Family Medicine

## 2021-11-30 MED ORDER — TURMERIC-GINGER 135-6 MG PO CHEW: 1.0000 | CHEWABLE_TABLET | Freq: Two times a day (BID) | ORAL | 0 refills | Status: AC

## 2021-11-30 MED ORDER — COLLAGEN HYDROLYSATE POWD: 0 refills | Status: DC

## 2021-11-30 NOTE — Progress Notes (Signed)
Meds ordered this encounter  Medications   Turmeric-Ginger 135-6 MG CHEW    Sig: Chew 1 tablet by mouth in the morning and at bedtime.    Dispense:  1 tablet    Refill:  0

## 2021-12-07 ENCOUNTER — Ambulatory Visit: Payer: Medicare Other | Admitting: Podiatry

## 2021-12-13 ENCOUNTER — Encounter: Payer: Self-pay | Admitting: Family Medicine

## 2021-12-18 ENCOUNTER — Encounter: Payer: Self-pay | Admitting: Family Medicine

## 2021-12-20 ENCOUNTER — Emergency Department
Admission: EM | Admit: 2021-12-20 | Discharge: 2021-12-20 | Disposition: A | Discharge status: Home / Self Care | Payer: Medicare Other | Attending: Family Medicine | Admitting: Family Medicine

## 2021-12-20 DIAGNOSIS — R059 Cough, unspecified: Secondary | ICD-10-CM

## 2021-12-20 MED ORDER — DOXYCYCLINE HYCLATE 100 MG PO CAPS: 100.0000 mg | ORAL_CAPSULE | Freq: Two times a day (BID) | ORAL | 0 refills | Status: DC

## 2021-12-20 MED ORDER — PREDNISONE 20 MG PO TABS: ORAL_TABLET | ORAL | 0 refills | Status: DC

## 2021-12-20 NOTE — ED Triage Notes (Signed)
Pt presents to Urgent Care with c/o productive cough x 1 week; now beginning to lose his voice. Afebrile.

## 2021-12-20 NOTE — ED Provider Notes (Signed)
[facility]    CSN: [id number] Arrival date & time: 12/20/21  1200      History   Chief Complaint Chief Complaint  Patient presents with   Cough    HPI ABIMELEC GROCHOWSKI is a 81 y.o. male.   HPI Pleasant 81 year old male presents with productive cough for 1 week.  PMH significant for upper airway cough syndrome, cough variant asthma, HTN, and aortic atherosclerosis.   Patient Active Problem List   Diagnosis Date Noted   Acute pain of right shoulder 07/31/2021   Thyroid nodule 01/31/2021   Aortic atherosclerosis ([place]) 02/12/2020   DDD (degenerative disc disease), lumbar 02/12/2020   Lumbar pars defect - chronic L5 pars fracture 02/12/2020   Mid back pain on right side 02/09/2020   Asymmetrical hearing loss, bilateral 08/21/2019   Meniere disease, left 08/21/2019   Diverticulosis 04/26/2019   Gastroesophageal reflux disease with esophagitis without hemorrhage 03/20/2019   S/P left THA, AA 10/22/2017   Osteoarthritis of left hip 08/21/2017   Osteoarthritis of knee 06/10/2017   Pain in left knee 06/10/2017   ED (erectile dysfunction) 04/17/2017   S/P right THA, AA 11/15/2015   Cough variant asthma 04/03/2015   Upper airway cough syndrome 03/10/2015   HYPERLIPIDEMIA 02/12/2007   Essential hypertension 02/12/2007   COLONIC POLYPS, HX OF 02/12/2007   NEPHROLITHIASIS, HX OF 02/12/2007    Past Surgical History:  Procedure Laterality Date   cataract Bilateral 05/21/2013   CYSTOSCOPY W/ STONE MANIPULATION     x 2   EYE SURGERY  cataracts for both eyes   HEMORRHOID SURGERY     INGUINAL HERNIA REPAIR Right    JOINT REPLACEMENT  2017, 2019   LAPAROSCOPIC DRAINAGE RENAL CYST     LITHOTRIPSY     x 1    TOTAL HIP ARTHROPLASTY Right 11/15/2015   Procedure: RIGHT TOTAL HIP ARTHROPLASTY ANTERIOR APPROACH;  Surgeon: Paralee Cancel, MD;  Location: [facility];  Service: Orthopedics;  Laterality: Right;   TOTAL HIP ARTHROPLASTY Left 10/22/2017   Procedure: LEFT TOTAL HIP  ARTHROPLASTY ANTERIOR APPROACH;  Surgeon: Paralee Cancel, MD;  Location: [facility];  Service: Orthopedics;  Laterality: Left;       Home Medications    Prior to Admission medications   Medication Sig Start Date End Date Taking? Authorizing Provider  aspirin EC 81 MG tablet Take 81 mg by mouth daily.   Yes [provider]  dextromethorphan (DELSYM) 30 MG/5ML liquid Take by mouth as needed for cough.   Yes [provider]  doxycycline (VIBRAMYCIN) 100 MG capsule Take 1 capsule (100 mg total) by mouth 2 (two) times daily for 7 days. 12/20/21 12/27/21 Yes Eliezer Lofts, FNP  guaiFENesin (MUCINEX) 600 MG 12 hr tablet Take by mouth 2 (two) times daily.   Yes [provider]  predniSONE (DELTASONE) 20 MG tablet Take 3 tabs PO daily x 5 days. 12/20/21  Yes Eliezer Lofts, FNP  ascorbic acid (VITAMIN C) 1000 MG tablet Take 1,000 mg by mouth daily with supper.     [provider]  atorvastatin (LIPITOR) 40 MG tablet Take 1 tablet (40 mg total) by mouth daily. 12/28/20 01/17/22  Hali Marry, MD  Cholecalciferol (VITAMIN D3) 2000 units TABS Take 2,000 Units by mouth at bedtime.     [provider]  Coenzyme Q10 (CO Q 10) 100 MG CAPS Take by mouth.    [provider]  Collagen Hydrolysate POWD Taken daily 11/30/21   Hali Marry, MD  DENTA 5000 PLUS 1.1 % CREA dental cream See admin instructions. 01/14/21   [provider]  famotidine (PEPCID) 20 MG tablet TAKE 1 TABLET BY MOUTH EVERYDAY AT BEDTIME 09/15/21   Hali Marry, MD  glucosamine-chondroitin 500-400 MG tablet Take 1 tablet by mouth daily with supper.     [provider]  Omega-3 1000 MG CAPS Take 1 tablet by mouth daily.    [provider]  pantoprazole (PROTONIX) 40 MG tablet Take 1 tablet (40 mg total) by mouth daily. 04/18/21   Hali Marry, MD  Turmeric-Ginger 135-6 MG CHEW Chew 1 tablet by mouth in the morning and at bedtime. 11/30/21    Hali Marry, MD  valsartan-hydrochlorothiazide (DIOVAN-HCT) 80-12.5 MG tablet TAKE 1 TABLET BY MOUTH EVERY DAY 11/13/21   Hali Marry, MD    Family History Family History  Problem Relation Age of Onset   Colon cancer Father    Cancer Father    Heart attack Mother 56   Hearing loss Mother    Heart disease Mother    Diabetes Other        1st degree relative    Social History Social History   Tobacco Use   Smoking status: Never   Smokeless tobacco: Never  Vaping Use   Vaping Use: Never used  Substance Use Topics   Alcohol use: No   Drug use: No     Allergies   Morphine, Morphine and related, and Cialis [tadalafil]   Review of Systems Review of Systems  Respiratory:  Positive for cough.   All other systems reviewed and are negative.    Physical Exam Triage Vital Signs ED Triage Vitals  Enc Vitals Group     BP      Pulse      Resp      Temp      Temp src      SpO2      Weight      Height      Head Circumference      Peak Flow      Pain Score      Pain Loc      Pain Edu?      Excl. in [place]?    No data found.  Updated Vital Signs BP (!) 115/57 (BP Location: Right Arm)   Pulse 65   Temp 98.7 F (37.1 C) (Oral)   Resp 20   Ht '5\' 7"'$  (1.702 m)   Wt 168 lb (76.2 kg)   SpO2 97%   BMI 26.31 kg/m      Physical Exam Vitals and nursing note reviewed.  Constitutional:      Appearance: Normal appearance. He is normal weight.  HENT:     Head: Normocephalic and atraumatic.     Right Ear: Tympanic membrane, ear canal and external ear normal.     Left Ear: Tympanic membrane, ear canal and external ear normal.     Mouth/Throat:     Mouth: Mucous membranes are moist.     Pharynx: Oropharynx is clear.  Eyes:     Extraocular Movements: Extraocular movements intact.     Conjunctiva/sclera: Conjunctivae normal.     Pupils: Pupils are equal, round, and reactive to light.  Cardiovascular:     Rate and Rhythm: Normal rate and regular  rhythm.     Pulses: Normal pulses.     Heart sounds: Normal heart sounds. No murmur heard. Pulmonary:     Effort: Pulmonary effort is  normal.     Breath sounds: Normal breath sounds. No wheezing, rhonchi or rales.     Comments: Infrequent nonproductive cough noted on exam Musculoskeletal:        General: Normal range of motion.     Cervical back: Normal range of motion and neck supple.  Skin:    General: Skin is warm and dry.  Neurological:     General: No focal deficit present.     Mental Status: He is alert and oriented to person, place, and time.      UC Treatments / Results  Labs (all labs ordered are listed, but only abnormal results are displayed) Labs Reviewed - No data to display  EKG   Radiology No results found.  Procedures Procedures (including critical care time)  Medications Ordered in UC Medications - No data to display  Initial Impression / Assessment and Plan / UC Course  I have reviewed the triage vital signs and the nursing notes.  Pertinent labs & imaging results that were available during my care of the patient were reviewed by me and considered in my medical decision making (see chart for details).     MDM: 1. Cough-Rx'd Doxycycline, Prednisone. Instructed patient to take medication as directed with food to completion.  Advised patient to take Prednisone with first dose of Doxycycline for the next 5 of 7 days.  Encouraged patient increase daily water intake while taking these medications.  Advised patient if symptoms worsen and/or unresolved please follow-up with PCP or here for further evaluation.  Patient discharged home, hemodynamically stable. Final Clinical Impressions(s) / UC Diagnoses   Final diagnoses:  Cough, unspecified type     Discharge Instructions      Instructed patient to take medication as directed with food to completion.  Advised patient to take Prednisone with first dose of Doxycycline for the next 5 of 7 days.  Encouraged  patient increase daily water intake while taking these medications.  Advised patient if symptoms worsen and/or unresolved please follow-up with PCP or here for further evaluation.     ED Prescriptions     Medication Sig Dispense Auth. Provider   doxycycline (VIBRAMYCIN) 100 MG capsule Take 1 capsule (100 mg total) by mouth 2 (two) times daily for 7 days. 14 capsule Eliezer Lofts, FNP   predniSONE (DELTASONE) 20 MG tablet Take 3 tabs PO daily x 5 days. 15 tablet Eliezer Lofts, FNP      PDMP not reviewed this encounter.   Eliezer Lofts, [place] 12/20/21 1242

## 2021-12-20 NOTE — Discharge Instructions (Addendum)
Instructed patient to take medication as directed with food to completion.  Advised patient to take Prednisone with first dose of Doxycycline for the next 5 of 7 days.  Encouraged patient increase daily water intake while taking these medications.  Advised patient if symptoms worsen and/or unresolved please follow-up with PCP or here for further evaluation.

## 2021-12-21 NOTE — Progress Notes (Signed)
HPI:FU aortic atherosclerosis.  Chest CT February 2022 showed severe coronary atherosclerotic calcifications and aortic atherosclerosis.  There was note of a thyroid nodule and follow-up ultrasound recommended.  Nuclear study May 2022 showed ejection fraction 60% and fixed inferior/apical defect suggestive of artifact but no ischemia.  Abdominal CT June 2023 showed no aneurysm.  Since last seen the patient denies any dyspnea on exertion, orthopnea, PND, pedal edema, palpitations, syncope or chest pain.   Current Outpatient Medications  Medication Sig Dispense Refill   AMBULATORY NON FORMULARY MEDICATION Medication Name: CPAP set to 7 cm water pressure.  Current machine has expired.  Please order through [facility]. 1 Units 0   ascorbic acid (VITAMIN C) 1000 MG tablet Take 1,000 mg by mouth daily with supper.      aspirin EC 81 MG tablet Take 81 mg by mouth daily.     atorvastatin (LIPITOR) 40 MG tablet Take 1 tablet (40 mg total) by mouth daily. Labs for further refills 90 tablet 0   Cholecalciferol (VITAMIN D3) 2000 units TABS Take 2,000 Units by mouth at bedtime.      Coenzyme Q10 (CO Q 10) 100 MG CAPS Take by mouth.     Collagen Hydrolysate POWD Taken daily 1 g 0   DENTA 5000 PLUS 1.1 % CREA dental cream See admin instructions.     dextromethorphan (DELSYM) 30 MG/5ML liquid Take by mouth as needed for cough.     famotidine (PEPCID) 20 MG tablet TAKE 1 TABLET BY MOUTH EVERYDAY AT BEDTIME 90 tablet 2   glucosamine-chondroitin 500-400 MG tablet Take 1 tablet by mouth daily with supper.      Omega-3 1000 MG CAPS Take 1 tablet by mouth daily.     pantoprazole (PROTONIX) 40 MG tablet Take 1 tablet (40 mg total) by mouth daily. 90 tablet 3   Turmeric-Ginger 135-6 MG CHEW Chew 1 tablet by mouth in the morning and at bedtime. 1 tablet 0   valsartan-hydrochlorothiazide (DIOVAN-HCT) 80-12.5 MG tablet TAKE 1 TABLET BY MOUTH EVERY DAY 90 tablet 1   No current facility-administered medications for this  visit.     Past Medical History:  Diagnosis Date   Arthritis    osteoarthritis right hip   Carotid artery plaque    mild   Colon polyps    Erectile dysfunction    GERD (gastroesophageal reflux disease)    history- no current problems   History of nephrolithiasis    episodes x 6    HLD (hyperlipidemia)    HTN (hypertension)    Impaired hearing    bilateral hearing aids   Sleep apnea    cpap used with nasal clip   Thyroid nodule     Past Surgical History:  Procedure Laterality Date   cataract Bilateral 05/21/2013   CYSTOSCOPY W/ STONE MANIPULATION     x 2   EYE SURGERY  cataracts for both eyes   HEMORRHOID SURGERY     INGUINAL HERNIA REPAIR Right    JOINT REPLACEMENT  2017, 2019   LAPAROSCOPIC DRAINAGE RENAL CYST     LITHOTRIPSY     x 1    TOTAL HIP ARTHROPLASTY Right 11/15/2015   Procedure: RIGHT TOTAL HIP ARTHROPLASTY ANTERIOR APPROACH;  Surgeon: Paralee Cancel, MD;  Location: [facility];  Service: Orthopedics;  Laterality: Right;   TOTAL HIP ARTHROPLASTY Left 10/22/2017   Procedure: LEFT TOTAL HIP ARTHROPLASTY ANTERIOR APPROACH;  Surgeon: Paralee Cancel, MD;  Location: [facility];  Service: Orthopedics;  Laterality: Left;  Social History   Socioeconomic History   Marital status: Married    Spouse name: [name]    Number of children: 3   Years of education: 18   Highest education level: Master's degree (e.g., MA, MS, MEng, MEd, MSW, MBA)  Occupational History   Occupation: Retired     Comment: Hydrologist  Tobacco Use   Smoking status: Never   Smokeless tobacco: Never  Vaping Use   Vaping Use: Never used  Substance and Sexual Activity   Alcohol use: No   Drug use: No   Sexual activity: Yes  Other Topics Concern   Not on file  Social History Narrative   Married.  No regular exercise.  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 [employer]   Social Determinants of Health   Financial Resource Strain: Low Risk  (02/08/2021)   Overall Financial Resource Strain (CARDIA)    Difficulty of Paying Living Expenses: Not hard at all  Food Insecurity: No Food Insecurity (02/08/2021)   Hunger Vital Sign    Worried About Running Out of Food in the Last Year: Never true    Ran Out of Food in the Last Year: Never true  Transportation Needs: No Transportation Needs (02/08/2021)   PRAPARE - Hydrologist (Medical): No    Lack of Transportation (Non-Medical): No  Physical Activity: Inactive (02/08/2021)   Exercise Vital Sign    Days of Exercise per Week: 0 days    Minutes of Exercise per Session: 0 min  Stress: No Stress Concern Present (02/08/2021)   [place] of Stress : Not at all  Social Connections: [place] (02/08/2021)   Social Connection and Isolation Panel [NHANES]    Frequency of Communication with Friends and Family: More than three times a week    Frequency of Social Gatherings with Friends and Family: Never    Attends Religious Services: More than 4 times per year    Active Member of [employer] or Organizations: Yes    Attends Music therapist: More than 4 times per year    Marital Status: Married  Human resources officer Violence: Not At Risk (02/08/2021)   Humiliation, Afraid, Rape, and Kick questionnaire    Fear of Current or Ex-Partner: No    Emotionally Abused: No    Physically Abused: No    Sexually Abused: No    Family History  Problem Relation Age of Onset   Colon cancer Father    Cancer Father    Heart attack Mother 58   Hearing loss Mother    Heart disease Mother    Diabetes Other        1st degree relative    ROS: no fevers or chills, productive cough, hemoptysis, dysphasia, odynophagia, melena,  hematochezia, dysuria, hematuria, rash, seizure activity, orthopnea, PND, pedal edema, claudication. Remaining systems are negative.  Physical Exam: Well-developed well-nourished in no acute distress.  Skin is warm and dry.  HEENT is normal.  Neck is supple.  Chest is clear to auscultation with normal expansion.  Cardiovascular exam is regular rate and rhythm.  Abdominal exam nontender or distended. No masses palpated. Extremities show no edema. neuro grossly intact  ECG-normal sinus rhythm, no ST changes.  Personally reviewed  A/P  1 coronary artery disease/coronary calcification-follow-up nuclear study showed no ischemia and patient is not having chest pain.  Continue aspirin and statin.  2 hyperlipidemia-continue statin.  3 hypertension-patient's blood pressure is controlled.  Continue present medications.  4 sleep apnea-continue CPAP.  Kirk Ruths, MD

## 2021-12-26 ENCOUNTER — Encounter: Payer: Self-pay | Admitting: Family Medicine

## 2021-12-26 ENCOUNTER — Ambulatory Visit (INDEPENDENT_AMBULATORY_CARE_PROVIDER_SITE_OTHER): Payer: Medicare Other | Admitting: Family Medicine

## 2021-12-26 VITALS — BP 116/72 | HR 61 | Ht 66.0 in | Wt 154.0 lb

## 2021-12-26 DIAGNOSIS — R051 Acute cough: Secondary | ICD-10-CM

## 2021-12-26 DIAGNOSIS — G4733 Obstructive sleep apnea (adult) (pediatric): Secondary | ICD-10-CM | POA: Diagnosis not present

## 2021-12-26 MED ORDER — AMBULATORY NON FORMULARY MEDICATION: 0 refills | Status: DC

## 2021-12-26 NOTE — Assessment & Plan Note (Signed)
CPAP set at 7 cm water pressure.  Very compliant with his machine.  Will place new order and order through [facility].

## 2021-12-26 NOTE — Progress Notes (Signed)
   Established Patient Office Visit  Subjective   Patient ID: [name], male    DOB: 1941/03/18  Age: 81 y.o. MRN: [id number]  Chief Complaint  Patient presents with   Follow-up    Pt c/o new c-pap machine     HPI  Cord has a history of obstructive sleep apnea and has been wearing his CPAP consistently.  He last received his CPAP back in March 2016.  Recently the screen on his machine has been flashing that it is at the end of his life.  He did take the chip to be downloaded.  He did bring the download it shows 100% compliance he wears it consistently for greater than 4 hours a night.  He denies having significant leaks.  He would like to get a new CPAP machine.  His current 1 is currently set for 7 cm of water pressure.  It makes a huge difference in his quality of life when he wears his CPAP.  He wakes up feeling refreshed.  He also asked if I would mind listening to his chest.  He went to urgent care on August 2, about 6 days ago after having several days of cough and congestion in his upper chest.  No fever.  He was placed on prednisone and doxycycline.  He feels like he is improving and is at least 50% better but still has some residual symptoms.    ROS    Objective:     BP 116/72   Pulse 61   Ht '5\' 6"'$  (1.676 m)   Wt 154 lb (69.9 kg)   SpO2 99%   BMI 24.86 kg/m    Physical Exam Constitutional:      Appearance: He is well-developed.  HENT:     Head: Normocephalic and atraumatic.  Cardiovascular:     Rate and Rhythm: Normal rate and regular rhythm.     Heart sounds: Normal heart sounds.  Pulmonary:     Effort: Pulmonary effort is normal.     Breath sounds: Normal breath sounds.  Skin:    General: Skin is warm and dry.  Neurological:     Mental Status: He is alert and oriented to person, place, and time.  Psychiatric:        Behavior: Behavior normal.      No results found for any visits on 12/26/21.    The ASCVD Risk score (Arnett DK, et al., 2019)  failed to calculate for the following reasons:   The 2019 ASCVD risk score is only valid for ages 38 to 15    Assessment & Plan:   Problem List Items Addressed This Visit       Respiratory   OSA (obstructive sleep apnea)    CPAP set at 7 cm water pressure.  Very compliant with his machine.  Will place new order and order through [facility].      Relevant Medications   AMBULATORY NON FORMULARY MEDICATION   Other Visit Diagnoses     Acute cough    -  Primary      Acute cough-chest exam is normal and reassuring to make sure to complete antibiotics and prednisone.  Call if any new or worsening symptoms.  No follow-ups on file.    Beatrice Lecher, MD

## 2021-12-28 ENCOUNTER — Other Ambulatory Visit: Payer: Self-pay | Admitting: Family Medicine

## 2022-01-03 ENCOUNTER — Ambulatory Visit (INDEPENDENT_AMBULATORY_CARE_PROVIDER_SITE_OTHER): Payer: Medicare Other | Admitting: Cardiology

## 2022-01-03 ENCOUNTER — Encounter: Payer: Self-pay | Admitting: Cardiology

## 2022-01-03 VITALS — BP 118/60 | HR 62 | Ht 66.0 in | Wt 178.4 lb

## 2022-01-03 DIAGNOSIS — I2510 Atherosclerotic heart disease of native coronary artery without angina pectoris: Secondary | ICD-10-CM

## 2022-01-03 DIAGNOSIS — E7800 Pure hypercholesterolemia, unspecified: Secondary | ICD-10-CM

## 2022-01-03 DIAGNOSIS — I10 Essential (primary) hypertension: Secondary | ICD-10-CM

## 2022-01-03 NOTE — Patient Instructions (Signed)
Medication Instructions:   Your physician recommends that you continue on your current medications as directed. Please refer to the Current Medication list given to you today.   *If you need a refill on your cardiac medications before your next appointment, please call your pharmacy*   Lab Work:  None ordered.  If you have labs (blood work) drawn today and your tests are completely normal, you will receive your results only by: [place] (if you have MyChart) OR A paper copy in the mail If you have any lab test that is abnormal or we need to change your treatment, we will call you to review the results.   Testing/Procedures:  None ordered.   Follow-Up: At [facility], you and your health needs are our priority.  As part of our continuing mission to provide you with exceptional heart care, we have created designated Provider Care Teams.  These Care Teams include your primary Cardiologist (physician) and Advanced Practice Providers (APPs -  Physician Assistants and Nurse Practitioners) who all work together to provide you with the care you need, when you need it.  We recommend signing up for the patient portal called "MyChart".  Sign up information is provided on this After Visit Summary.  MyChart is used to connect with patients for Virtual Visits (Telemedicine).  Patients are able to view lab/test results, encounter notes, upcoming appointments, etc.  Non-urgent messages can be sent to your provider as well.   To learn more about what you can do with MyChart, go to [email or web address].    Your next appointment:   1 year(s)  The format for your next appointment:   In Person  Provider:   Kirk Ruths, MD    Other Instructions   Your physician wants you to follow-up in: 1 year with Dr.Crenshaw.  You will receive a reminder letter in the mail two months in advance. If you don't receive a letter, please call our office to schedule the follow-up  appointment.   Important Information About Sugar

## 2022-01-04 ENCOUNTER — Ambulatory Visit: Payer: Medicare Other | Admitting: Family Medicine

## 2022-01-29 ENCOUNTER — Encounter: Payer: Self-pay | Admitting: Family Medicine

## 2022-01-31 ENCOUNTER — Ambulatory Visit: Payer: Medicare Other | Admitting: Family Medicine

## 2022-01-31 DIAGNOSIS — H18603 Keratoconus, unspecified, bilateral: Secondary | ICD-10-CM | POA: Diagnosis not present

## 2022-01-31 DIAGNOSIS — Z961 Presence of intraocular lens: Secondary | ICD-10-CM | POA: Diagnosis not present

## 2022-02-12 ENCOUNTER — Ambulatory Visit: Payer: Medicare Other | Admitting: Family Medicine

## 2022-02-14 ENCOUNTER — Ambulatory Visit (INDEPENDENT_AMBULATORY_CARE_PROVIDER_SITE_OTHER): Payer: Medicare Other | Admitting: Family Medicine

## 2022-02-14 DIAGNOSIS — Z0000 Encounter for general adult medical examination without abnormal findings: Secondary | ICD-10-CM | POA: Diagnosis not present

## 2022-02-14 NOTE — Progress Notes (Signed)
MEDICARE ANNUAL WELLNESS VISIT  02/14/2022  Telephone Visit Disclaimer This Medicare AWV was conducted by telephone due to national recommendations for restrictions regarding the COVID-19 Pandemic (e.g. social distancing).  I verified, using two identifiers, that I am speaking with [name] or their authorized healthcare agent. I discussed the limitations, risks, security, and privacy concerns of performing an evaluation and management service by telephone and the potential availability of an in-person appointment in the future. The patient expressed understanding and agreed to proceed.  Location of Patient: Home Location of Provider (nurse): In the office.  Subjective:    [name] is a 81 y.o. male patient of Metheney, Rene Kocher, MD who had a Medicare Annual Wellness Visit today via telephone. Trew is Retired and lives with their spouse. he has 3 children. he reports that he is socially active and does interact with friends/family regularly. he is moderately physically active and enjoys pottery.  Patient Care Team: Hali Marry, MD as PCP - General (Family Medicine)     02/14/2022    8:19 AM 02/08/2021   10:14 AM 07/28/2019    4:00 PM 10/23/2017    9:00 AM 10/22/2017   11:42 AM 10/16/2017   10:32 AM 12/14/2016    2:32 PM  Advanced Directives  Does Patient Have a Medical Advance Directive? Yes Yes Yes Yes Yes Yes Yes  Type of Advance Directive Living will [place];Living will [place];Living will [place];Living will [place];Living will [place];Living will   Does patient want to make changes to medical advance directive? No - Patient declined No - Patient declined No - Patient declined No - Patient declined No - Patient declined No - Patient declined No - Patient declined  Copy of [place] in Chart?  Yes - validated most recent copy scanned in  chart (See row information) No - copy requested No - copy requested No - copy requested No - copy requested     Hospital Utilization Over the Past 12 Months: # of hospitalizations or ER visits: 0 # of surgeries: 0  Review of Systems    Patient reports that his overall health is better compared to last year.  History obtained from chart review and the patient  Patient Reported Readings (BP, Pulse, CBG, Weight, etc) none  Pain Assessment Pain : No/denies pain     Current Medications & Allergies (verified) Allergies as of 02/14/2022       Reactions   Morphine Nausea And Vomiting   Morphine And Related Nausea And Vomiting   Tadalafil Other (See Comments)   Limb pain        Medication List        Accurate as of February 14, 2022  8:27 AM. If you have any questions, ask your nurse or doctor.          AMBULATORY NON FORMULARY MEDICATION Medication Name: CPAP set to 7 cm water pressure.  Current machine has expired.  Please order through [facility].   ascorbic acid 1000 MG tablet Commonly known as: VITAMIN C Take 1,000 mg by mouth daily with supper.   aspirin EC 81 MG tablet Take 81 mg by mouth daily.   atorvastatin 40 MG tablet Commonly known as: LIPITOR Take 1 tablet (40 mg total) by mouth daily. Labs for further refills   Co Q 10 100 MG Caps Take by mouth.   Collagen Hydrolysate Powd Taken daily  Denta 5000 Plus 1.1 % Crea dental cream Generic drug: sodium fluoride See admin instructions.   dextromethorphan 30 MG/5ML liquid Commonly known as: DELSYM Take by mouth as needed for cough.   famotidine 20 MG tablet Commonly known as: PEPCID TAKE 1 TABLET BY MOUTH EVERYDAY AT BEDTIME   glucosamine-chondroitin 500-400 MG tablet Take 1 tablet by mouth daily with supper.   Omega-3 1000 MG Caps Take 1 tablet by mouth daily.   pantoprazole 40 MG tablet Commonly known as: PROTONIX Take 1 tablet (40 mg total) by mouth daily.   Turmeric-Ginger 135-6 MG  Chew Chew 1 tablet by mouth in the morning and at bedtime.   valsartan-hydrochlorothiazide 80-12.5 MG tablet Commonly known as: DIOVAN-HCT TAKE 1 TABLET BY MOUTH EVERY DAY   Vitamin D3 50 MCG (2000 UT) Tabs Take 2,000 Units by mouth at bedtime.        History (reviewed): Past Medical History:  Diagnosis Date   Arthritis    osteoarthritis right hip   Carotid artery plaque    mild   Colon polyps    Erectile dysfunction    GERD (gastroesophageal reflux disease)    history- no current problems   History of nephrolithiasis    episodes x 6    HLD (hyperlipidemia)    HTN (hypertension)    Impaired hearing    bilateral hearing aids   Sleep apnea    cpap used with nasal clip   Thyroid nodule    Past Surgical History:  Procedure Laterality Date   cataract Bilateral 05/21/2013   CYSTOSCOPY W/ STONE MANIPULATION     x 2   EYE SURGERY  cataracts for both eyes   HEMORRHOID SURGERY     INGUINAL HERNIA REPAIR Right    JOINT REPLACEMENT  2017, 2019   LAPAROSCOPIC DRAINAGE RENAL CYST     LITHOTRIPSY     x 1    TOTAL HIP ARTHROPLASTY Right 11/15/2015   Procedure: RIGHT TOTAL HIP ARTHROPLASTY ANTERIOR APPROACH;  Surgeon: Paralee Cancel, MD;  Location: [facility];  Service: Orthopedics;  Laterality: Right;   TOTAL HIP ARTHROPLASTY Left 10/22/2017   Procedure: LEFT TOTAL HIP ARTHROPLASTY ANTERIOR APPROACH;  Surgeon: Paralee Cancel, MD;  Location: [facility];  Service: Orthopedics;  Laterality: Left;   Family History  Problem Relation Age of Onset   Colon cancer Father    Cancer Father    Heart attack Mother 65   Hearing loss Mother    Heart disease Mother    Diabetes Other        1st degree relative   Social History   Socioeconomic History   Marital status: Married    Spouse name: [name]    Number of children: 3   Years of education: 18   Highest education level: Master's degree (e.g., MA, MS, MEng, MEd, MSW, MBA)  Occupational History   Occupation: Retired     Comment:  Environmental consultant super intendant  Tobacco Use   Smoking status: Never   Smokeless tobacco: Never  Vaping Use   Vaping Use: Never used  Substance and Sexual Activity   Alcohol use: No   Drug use: No   Sexual activity: Yes  Other Topics Concern   Not on file  Social History Narrative   Married.  No regular exercise.  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 [employer]   Social Determinants of Health   Financial Resource Strain: Low Risk  (02/12/2022)   Overall Financial Resource Strain (CARDIA)    Difficulty of Paying Living Expenses: Not very hard  Food Insecurity: No Food Insecurity (02/14/2022)   Hunger Vital Sign    Worried About Running Out of Food in the Last Year: Never true    Ran Out of Food in the Last Year: Never true  Transportation Needs: No Transportation Needs (02/12/2022)   PRAPARE - Hydrologist (Medical): No    Lack of Transportation (Non-Medical): No  Physical Activity: Insufficiently Active (02/12/2022)   Exercise Vital Sign    Days of Exercise per Week: 2 days    Minutes of Exercise per Session: 50 min  Stress: No Stress Concern Present (02/12/2022)   [place]    Feeling of Stress : Only a little  Social Connections: Moderately Isolated (02/14/2022)   Social Connection and Isolation Panel [NHANES]    Frequency of Communication with Friends and Family: Once a week    Frequency of Social Gatherings with Friends and Family: Once a week    Attends Religious Services: More than 4 times per year    Active Member of [employer] or Organizations: No    Attends Archivist Meetings: Never    Marital Status: Married    Activities of Daily Living    02/14/2022    8:19 AM 02/12/2022    12:34 PM  In your present state of health, do you have any difficulty performing the following activities:  Hearing?  1  Comment wears bilateral hearing aids and can hear well with them.   Vision?  0  Difficulty concentrating or making decisions?  0  Walking or climbing stairs?  0  Dressing or bathing?  0  Doing errands, shopping?  0  Preparing Food and eating ?  N  Using the Toilet?  N  In the past six months, have you accidently leaked urine?  N  Do you have problems with loss of bowel control?  N  Managing your Medications?  N  Managing your Finances?  N  Housekeeping or managing your Housekeeping?  N    Patient Education/ Literacy How often do you need to have someone help you when you read instructions, pamphlets, or other written materials from your doctor or pharmacy?: 1 - Never What is the last grade level you completed in school?: Master's degree  Exercise Current Exercise Habits: Home exercise routine, Type of exercise: walking, Time (Minutes): > 60, Frequency (Times/Week): 2, Weekly Exercise (Minutes/Week): 0, Intensity: Moderate, Exercise limited by: None identified  Diet Patient reports consuming 3 meals a day and 1 snack(s) a day Patient reports that his primary diet is: Regular Patient reports that she does have regular access to food.   Depression Screen    02/14/2022    8:22 AM 10/27/2021    1:19 PM 07/31/2021    9:00 AM 02/08/2021   10:14 AM 01/31/2021    3:25 PM 12/14/2019    8:18 AM 07/28/2019    4:00 PM  PHQ 2/9 Scores  PHQ - 2 Score 0 0 0 0 0 0 0  PHQ- 9 Score      1      Fall Risk    02/14/2022    8:22 AM 02/12/2022   12:34 PM 02/11/2022    1:18 PM 10/27/2021  1:20 PM 10/27/2021    1:19 PM  Fall Risk   Falls in the past year? 0 0 0   0 1 0  Number falls in past yr: 0 0 0   0 0 0  Injury with Fall? 0 0 0   0 0 0  Risk for fall due to : No Fall Risks   Other (Comment) No Fall Risks  Follow up Falls evaluation completed   Falls prevention  discussed;Falls evaluation completed Falls prevention discussed;Falls evaluation completed     Objective:  AGRON SWINEY seemed alert and oriented and he participated appropriately during our telephone visit.  Blood Pressure Weight BMI  BP Readings from Last 3 Encounters:  01/03/22 118/60  12/26/21 116/72  12/20/21 (!) 115/57   Wt Readings from Last 3 Encounters:  01/03/22 178 lb 6.4 oz (80.9 kg)  12/26/21 154 lb (69.9 kg)  12/20/21 168 lb (76.2 kg)   BMI Readings from Last 1 Encounters:  01/03/22 28.79 kg/m    *Unable to obtain current vital signs, weight, and BMI due to telephone visit type  Hearing/Vision  Anup did not seem to have difficulty with hearing/understanding during the telephone conversation Reports that he has had a formal eye exam by an eye care professional within the past year Reports that he has had a formal hearing evaluation within the past year *Unable to fully assess hearing and vision during telephone visit type  Cognitive Function:    02/14/2022    8:23 AM 02/08/2021   10:20 AM 07/28/2019    4:03 PM  6CIT Screen  What Year? 0 points 0 points 0 points  What month? 0 points 0 points 0 points  What time? 0 points 0 points 0 points  Count back from 20 0 points 0 points 0 points  Months in reverse 0 points 0 points 0 points  Repeat phrase 0 points 0 points 2 points  Total Score 0 points 0 points 2 points   (Normal:0-7, Significant for Dysfunction: >8)  Normal Cognitive Function Screening: Yes   Immunization & Health Maintenance Record Immunization History  Administered Date(s) Administered   Fluad Quad(high Dose 65+) 03/20/2019, 03/21/2020, 03/17/2021   Influenza Split 05/02/2011   Influenza Whole 03/07/2009, 04/26/2010   Influenza, High Dose Seasonal PF 03/08/2017, 03/15/2018   Influenza,inj,Quad PF,6+ Mos 05/09/2015   Influenza-Unspecified 02/18/2014   PFIZER(Purple Top)SARS-COV-2 Vaccination 06/26/2019, 07/22/2019   PPD Test 04/25/2015    Pneumococcal Conjugate-13 02/12/2007, 08/23/2014   Pneumococcal Polysaccharide-23 03/07/2009   Td 07/16/2003   Tdap 10/05/2013   Zoster Recombinat (Shingrix) 02/22/2021, 10/28/2021   Zoster, Live 01/06/2008    Health Maintenance  Topic Date Due   COVID-19 Vaccine (3 - Pfizer series) 02/16/2022 (Originally 09/16/2019)   INFLUENZA VACCINE  08/19/2022 (Originally 12/19/2021)   TETANUS/TDAP  10/06/2023   Pneumonia Vaccine 104+ Years old  Completed   Zoster Vaccines- Shingrix  Completed   HPV VACCINES  Aged Out       Assessment  This is a routine wellness examination for [employer]  Health Maintenance: Due or Overdue There are no preventive care reminders to display for this patient.   [name] does not need a referral for Community Assistance: Care Management:   no Social Work:    no Prescription Assistance:  no Nutrition/Diabetes Education:  no   Plan:  Personalized Goals  Goals Addressed               This Visit's Progress  Patient Stated (pt-stated)        He would like to maintain his weight loss.        Personalized Health Maintenance & Screening Recommendations  Influenza vaccine  Lung Cancer Screening Recommended: no (Low Dose CT Chest recommended if Age 94-80 years, 30 pack-year currently smoking OR have quit w/in past 15 years) Hepatitis C Screening recommended: no HIV Screening recommended: no  Advanced Directives: Written information was not prepared per patient's request.  Referrals & Orders No orders of the defined types were placed in this encounter.   Follow-up Plan Follow-up with Hali Marry, MD as planned Schedule your influenza vaccine. Medicare wellness visit in one year.  AVS printed and mailed to the patient.   I have personally reviewed and noted the following in the patient's chart:   Medical and social history Use of alcohol, tobacco or illicit drugs  Current medications and supplements Functional  ability and status Nutritional status Physical activity Advanced directives List of other physicians Hospitalizations, surgeries, and ER visits in previous 12 months Vitals Screenings to include cognitive, depression, and falls Referrals and appointments  In addition, I have reviewed and discussed with [name] certain preventive protocols, quality metrics, and best practice recommendations. A written personalized care plan for preventive services as well as general preventive health recommendations is available and can be mailed to the patient at his request.      Tinnie Gens, RN BSN  02/14/2022

## 2022-02-14 NOTE — Patient Instructions (Addendum)
[place] Maintenance Summary and Written Plan of Care  [name] ,  Thank you for allowing me to perform your Medicare Annual Wellness Visit and for your ongoing commitment to your health.   Health Maintenance & Immunization History Health Maintenance  Topic Date Due  . COVID-19 Vaccine (3 - Pfizer series) 02/16/2022 (Originally 09/16/2019)  . INFLUENZA VACCINE  08/19/2022 (Originally 12/19/2021)  . TETANUS/TDAP  10/06/2023  . Pneumonia Vaccine 1+ Years old  Completed  . Zoster Vaccines- Shingrix  Completed  . HPV VACCINES  Aged Out   Immunization History  Administered Date(s) Administered  . Fluad Quad(high Dose 65+) 03/20/2019, 03/21/2020, 03/17/2021  . Influenza Split 05/02/2011  . Influenza Whole 03/07/2009, 04/26/2010  . Influenza, High Dose Seasonal PF 03/08/2017, 03/15/2018  . Influenza,inj,Quad PF,6+ Mos 05/09/2015  . Influenza-Unspecified 02/18/2014  . PFIZER(Purple Top)SARS-COV-2 Vaccination 06/26/2019, 07/22/2019  . PPD Test 04/25/2015  . Pneumococcal Conjugate-13 02/12/2007, 08/23/2014  . Pneumococcal Polysaccharide-23 03/07/2009  . Td 07/16/2003  . Tdap 10/05/2013  . Zoster Recombinat (Shingrix) 02/22/2021, 10/28/2021  . Zoster, Live 01/06/2008    These are the patient goals that we discussed:  Goals Addressed              This Visit's Progress   .  Patient Stated (pt-stated)        He would like to maintain his weight loss.         This is a list of Health Maintenance Items that are overdue or due now: Influenza vaccine  Orders/Referrals Placed Today: No orders of the defined types were placed in this encounter.  (Contact our referral department at [phone] if you have not spoken with someone about your referral appointment within the next 5 days)    Follow-up Plan Follow-up with Hali Marry, MD as planned Schedule your influenza vaccine. Medicare wellness visit in one year.  AVS printed and  mailed to the patient.      Health Maintenance, Male Adopting a healthy lifestyle and getting preventive care are important in promoting health and wellness. Ask your health care provider about: The right schedule for you to have regular tests and exams. Things you can do on your own to prevent diseases and keep yourself healthy. What should I know about diet, weight, and exercise? Eat a healthy diet  Eat a diet that includes plenty of vegetables, fruits, low-fat dairy products, and lean protein. Do not eat a lot of foods that are high in solid fats, added sugars, or sodium. Maintain a healthy weight Body mass index (BMI) is a measurement that can be used to identify possible weight problems. It estimates body fat based on height and weight. Your health care provider can help determine your BMI and help you achieve or maintain a healthy weight. Get regular exercise Get regular exercise. This is one of the most important things you can do for your health. Most adults should: Exercise for at least 150 minutes each week. The exercise should increase your heart rate and make you sweat (moderate-intensity exercise). Do strengthening exercises at least twice a week. This is in addition to the moderate-intensity exercise. Spend less time sitting. Even light physical activity can be beneficial. Watch cholesterol and blood lipids Have your blood tested for lipids and cholesterol at 80 years of age, then have this test every 5 years. You may need to have your cholesterol levels checked more often if: Your lipid or cholesterol levels are high. You are older than 80  years of age. You are at high risk for heart disease. What should I know about cancer screening? Many types of cancers can be detected early and may often be prevented. Depending on your health history and family history, you may need to have cancer screening at various ages. This may include screening for: Colorectal cancer. Prostate  cancer. Skin cancer. Lung cancer. What should I know about heart disease, diabetes, and high blood pressure? Blood pressure and heart disease High blood pressure causes heart disease and increases the risk of stroke. This is more likely to develop in people who have high blood pressure readings or are overweight. Talk with your health care provider about your target blood pressure readings. Have your blood pressure checked: Every 3-5 years if you are 101-95 years of age. Every year if you are 64 years old or older. If you are between the ages of 66 and 57 and are a current or former smoker, ask your health care provider if you should have a one-time screening for abdominal aortic aneurysm (AAA). Diabetes Have regular diabetes screenings. This checks your fasting blood sugar level. Have the screening done: Once every three years after age 73 if you are at a normal weight and have a low risk for diabetes. More often and at a younger age if you are overweight or have a high risk for diabetes. What should I know about preventing infection? Hepatitis B If you have a higher risk for hepatitis B, you should be screened for this virus. Talk with your health care provider to find out if you are at risk for hepatitis B infection. Hepatitis C Blood testing is recommended for: Everyone born from [birth year] through 1964. Anyone with known risk factors for hepatitis C. Sexually transmitted infections (STIs) You should be screened each year for STIs, including gonorrhea and chlamydia, if: You are sexually active and are younger than 80 years of age. You are older than 80 years of age and your health care provider tells you that you are at risk for this type of infection. Your sexual activity has changed since you were last screened, and you are at increased risk for chlamydia or gonorrhea. Ask your health care provider if you are at risk. Ask your health care provider about whether you are at high risk for HIV.  Your health care provider may recommend a prescription medicine to help prevent HIV infection. If you choose to take medicine to prevent HIV, you should first get tested for HIV. You should then be tested every 3 months for as long as you are taking the medicine. Follow these instructions at home: Alcohol use Do not drink alcohol if your health care provider tells you not to drink. If you drink alcohol: Limit how much you have to 0-2 drinks a day. Know how much alcohol is in your drink. In the [place] one drink equals one 12 oz bottle of beer (355 mL), one 5 oz glass of wine (148 mL), or one 1 oz glass of hard liquor (44 mL). Lifestyle Do not use any products that contain nicotine or tobacco. These products include cigarettes, chewing tobacco, and vaping devices, such as e-cigarettes. If you need help quitting, ask your health care provider. Do not use street drugs. Do not share needles. Ask your health care provider for help if you need support or information about quitting drugs. General instructions Schedule regular health, dental, and eye exams. Stay current with your vaccines. Tell your health care provider if: You often  feel depressed. You have ever been abused or do not feel safe at home. Summary Adopting a healthy lifestyle and getting preventive care are important in promoting health and wellness. Follow your health care provider's instructions about healthy diet, exercising, and getting tested or screened for diseases. Follow your health care provider's instructions on monitoring your cholesterol and blood pressure. This information is not intended to replace advice given to you by your health care provider. Make sure you discuss any questions you have with your health care provider. Document Revised: 09/26/2020 Document Reviewed: 09/26/2020 Elsevier Patient Education  [place].

## 2022-02-16 ENCOUNTER — Ambulatory Visit: Payer: Medicare Other | Admitting: Family Medicine

## 2022-03-01 ENCOUNTER — Other Ambulatory Visit: Payer: Self-pay | Admitting: Neurology

## 2022-03-01 ENCOUNTER — Telehealth: Payer: Self-pay | Admitting: Neurology

## 2022-03-01 ENCOUNTER — Ambulatory Visit (INDEPENDENT_AMBULATORY_CARE_PROVIDER_SITE_OTHER): Payer: Medicare Other | Admitting: Family Medicine

## 2022-03-01 VITALS — BP 112/67 | HR 62 | Ht 66.0 in | Wt 177.0 lb

## 2022-03-01 DIAGNOSIS — I10 Essential (primary) hypertension: Secondary | ICD-10-CM | POA: Diagnosis not present

## 2022-03-01 DIAGNOSIS — Z23 Encounter for immunization: Secondary | ICD-10-CM

## 2022-03-01 DIAGNOSIS — G4733 Obstructive sleep apnea (adult) (pediatric): Secondary | ICD-10-CM

## 2022-03-01 DIAGNOSIS — I2510 Atherosclerotic heart disease of native coronary artery without angina pectoris: Secondary | ICD-10-CM | POA: Diagnosis not present

## 2022-03-01 DIAGNOSIS — E785 Hyperlipidemia, unspecified: Secondary | ICD-10-CM

## 2022-03-01 MED ORDER — AMBULATORY NON FORMULARY MEDICATION: 0 refills | Status: DC

## 2022-03-01 NOTE — Assessment & Plan Note (Signed)
Due to recheck lipids. 

## 2022-03-01 NOTE — Telephone Encounter (Signed)
[name] came into the office today about his CPAP machine. This was ordered at his visit in August. He has heard nothing since. I called [facility] in [name] [phone]) and they have order from August, but it was cancelled in September. They could not tell why this was cancelled. He states he thinks the patient wanted to go to [facility]. I confirmed with patient in the office he did not state this and did not want order sent elsewhere.   They reopened the new CPAP order at the [facility] office of [facility] and stated they would call the patient about this today. Confirmed they do not need updated information faxed to them.   Dr. Madilyn Fireman - FYI.

## 2022-03-01 NOTE — Progress Notes (Signed)
   Established Patient Office Visit  Subjective   Patient ID: [name], male    DOB: [birth year]  Age: 81 y.o. MRN: [id number]  Chief Complaint  Patient presents with   Hypertension   Follow-up    HPI  Hypertension- Pt denies chest pain, SOB, dizziness, or heart palpitations.  Taking meds as directed w/o problems.  Denies medication side effects.    He did want to ask about the RSV vaccine.  And whether or not he would be a good candidate for that.  Obstructive sleep apnea-he is still not been able to get a new machine we have placed a new order when I saw saw him a couple months ago but he is still not heard back from the company.     ROS    Objective:     BP 112/67   Pulse 62   Ht '5\' 6"'$  (1.676 m)   Wt 177 lb (80.3 kg)   SpO2 96%   BMI 28.57 kg/m    Physical Exam Constitutional:      Appearance: He is well-developed.  HENT:     Head: Normocephalic and atraumatic.  Cardiovascular:     Rate and Rhythm: Normal rate and regular rhythm.     Heart sounds: Normal heart sounds.  Pulmonary:     Effort: Pulmonary effort is normal.     Breath sounds: Normal breath sounds.  Skin:    General: Skin is warm and dry.  Neurological:     Mental Status: He is alert and oriented to person, place, and time.  Psychiatric:        Behavior: Behavior normal.      No results found for any visits on 03/01/22.    The ASCVD Risk score (Arnett DK, et al., 2019) failed to calculate for the following reasons:   The 2019 ASCVD risk score is only valid for ages 7 to 45    Assessment & Plan:   Problem List Items Addressed This Visit       Cardiovascular and Mediastinum   Essential hypertension - Primary    Well controlled. Continue current regimen. Follow up in  6 mo       Relevant Orders   Lipid Panel w/reflex Direct LDL   COMPLETE METABOLIC PANEL WITH GFR   CBC     Respiratory   OSA (obstructive sleep apnea)    See prior note for details.  Will contact and try  to refax the order to [place] [facility].  We did previously try to get it scheduled through [facility].        Other   Hyperlipidemia    Due to recheck lipids.      Relevant Orders   Lipid Panel w/reflex Direct LDL   COMPLETE METABOLIC PANEL WITH GFR   CBC   Other Visit Diagnoses     Flu vaccine need       Relevant Orders   Flu Vaccine QUAD High Dose(Fluad) (Completed)      Also discussed possibly getting the RSV vaccine at his pharmacy.  Return in about 6 months (around 08/31/2022) for Hypertension.    Beatrice Lecher, MD

## 2022-03-01 NOTE — Assessment & Plan Note (Addendum)
Well controlled. Continue current regimen. Follow up in  6 mo  

## 2022-03-01 NOTE — Assessment & Plan Note (Signed)
See prior note for details.  Will contact and try to refax the order to [place].  We did previously try to get it scheduled through [facility].

## 2022-03-02 LAB — COMPLETE METABOLIC PANEL WITH GFR
AG Ratio: 1.8 (calc) (ref 1.0–2.5)
ALT: 20 U/L (ref 9–46)
AST: 20 U/L (ref 10–35)
Albumin: 4.5 g/dL (ref 3.6–5.1)
Alkaline phosphatase (APISO): 65 U/L (ref 35–144)
BUN: 25 mg/dL (ref 7–25)
CO2: 34 mmol/L — ABNORMAL HIGH (ref 20–32)
Calcium: 10.1 mg/dL (ref 8.6–10.3)
Chloride: 100 mmol/L (ref 98–110)
Creat: 1.19 mg/dL (ref 0.70–1.22)
Globulin: 2.5 g/dL (calc) (ref 1.9–3.7)
Glucose, Bld: 116 mg/dL — ABNORMAL HIGH (ref 65–99)
Potassium: 4.2 mmol/L (ref 3.5–5.3)
Sodium: 143 mmol/L (ref 135–146)
Total Bilirubin: 1.3 mg/dL — ABNORMAL HIGH (ref 0.2–1.2)
Total Protein: 7 g/dL (ref 6.1–8.1)
eGFR: 62 mL/min/{1.73_m2} (ref >=60)

## 2022-03-02 LAB — LIPID PANEL W/REFLEX DIRECT LDL
Cholesterol: 123 mg/dL (ref <200)
HDL: 38 mg/dL — ABNORMAL LOW (ref >=40)
LDL Cholesterol (Calc): 62 mg/dL (calc)
Non-HDL Cholesterol (Calc): 85 mg/dL (calc) (ref <130)
Total CHOL/HDL Ratio: 3.2 (calc) (ref <5.0)
Triglycerides: 146 mg/dL (ref <150)

## 2022-03-02 LAB — CBC
HCT: 47.7 % (ref 38.5–50.0)
Hemoglobin: 16.7 g/dL (ref 13.2–17.1)
MCH: 31.7 pg (ref 27.0–33.0)
MCHC: 35 g/dL (ref 32.0–36.0)
MCV: 90.7 fL (ref 80.0–100.0)
MPV: 10.4 fL (ref 7.5–12.5)
Platelets: 169 10*3/uL (ref 140–400)
RBC: 5.26 10*6/uL (ref 4.20–5.80)
RDW: 12.6 % (ref 11.0–15.0)
WBC: 7.2 10*3/uL (ref 3.8–10.8)

## 2022-03-02 NOTE — Progress Notes (Signed)
Hi Ugochukwu, LDL cholesterol looks great.  The HDL which is the good kind has dropped slightly.  This usually correlates with activity and exercise so just make sure you are staying active.  Metabolic panel is acceptable.  And blood count is normal.

## 2022-03-29 ENCOUNTER — Other Ambulatory Visit: Payer: Self-pay | Admitting: Family Medicine

## 2022-04-06 ENCOUNTER — Telehealth: Payer: Self-pay

## 2022-04-06 MED ORDER — ALPRAZOLAM 0.5 MG PO TABS: 0.5000 mg | ORAL_TABLET | Freq: Three times a day (TID) | ORAL | 0 refills | Status: DC | PRN

## 2022-04-06 NOTE — Telephone Encounter (Signed)
Meds ordered this encounter  Medications   DISCONTD: ALPRAZolam (XANAX) 0.5 MG tablet    Sig: Take 1 tablet (0.5 mg total) by mouth 3 (three) times daily as needed for anxiety. With flying    Dispense:  4 tablet    Refill:  0   ALPRAZolam (XANAX) 0.5 MG tablet    Sig: Take 1 tablet (0.5 mg total) by mouth 3 (three) times daily as needed for anxiety. With flying    Dispense:  4 tablet    Refill:  0

## 2022-04-06 NOTE — Telephone Encounter (Signed)
[name] advised.

## 2022-04-06 NOTE — Telephone Encounter (Signed)
Patients wife called and states they are going to [place] for the holidays and they are flying and [name] is not a fan of it so they were wondering can you send in some xanax for his flight there and back and they just want half a tablet for going and coming back not a full refill please advise.

## 2022-04-06 NOTE — Telephone Encounter (Signed)
[name] called and states Orrin will need Xanax 0.5 mg 4 tablets to fly. He is feeling panic about flying.

## 2022-04-23 ENCOUNTER — Encounter: Payer: Self-pay | Admitting: Family Medicine

## 2022-04-23 ENCOUNTER — Ambulatory Visit (INDEPENDENT_AMBULATORY_CARE_PROVIDER_SITE_OTHER): Payer: Medicare Other | Admitting: Family Medicine

## 2022-04-23 VITALS — BP 127/64 | HR 73 | Temp 98.5°F | Ht 66.0 in | Wt 178.1 lb

## 2022-04-23 DIAGNOSIS — I2510 Atherosclerotic heart disease of native coronary artery without angina pectoris: Secondary | ICD-10-CM | POA: Diagnosis not present

## 2022-04-23 DIAGNOSIS — J209 Acute bronchitis, unspecified: Secondary | ICD-10-CM

## 2022-04-23 DIAGNOSIS — G4733 Obstructive sleep apnea (adult) (pediatric): Secondary | ICD-10-CM

## 2022-04-23 MED ORDER — PREDNISONE 20 MG PO TABS: 40.0000 mg | ORAL_TABLET | Freq: Every day | ORAL | 0 refills | Status: DC

## 2022-04-23 NOTE — Progress Notes (Signed)
   Acute Office Visit  Subjective:     Patient ID: [name], male    DOB: [birth year], 81 y.o.   MRN: [id number]  Chief Complaint  Patient presents with   Cough    Cough Pertinent negatives include no chest pain, chills, ear pain, fever, headaches, sore throat, shortness of breath or wheezing.   Patient is in today for a cough since last Wednesday. His wife has also been sick and treated with antibiotics. The cough is dry but causes "chest heaviness" after coughing forcefully. He has been taking Tylenol which helps. He also has a runny nose. Denies fever, chills, body aches, headache, sore throat, ear pain.   He also states that he has not received anymore information regarding his CPAP.   Review of Systems  Constitutional:  Negative for chills and fever.  HENT:  Negative for congestion, ear pain and sore throat.   Respiratory:  Positive for cough. Negative for shortness of breath and wheezing.   Cardiovascular:  Negative for chest pain.  Gastrointestinal:  Negative for nausea and vomiting.  Neurological:  Negative for headaches.        Objective:    BP 127/64 (BP Location: Left Arm, Patient Position: Sitting, Cuff Size: Large)   Pulse 73   Temp 98.5 F (36.9 C)   Ht '5\' 6"'$  (1.676 m)   Wt 80.8 kg   SpO2 98%   BMI 28.75 kg/m    Physical Exam Constitutional:      Appearance: Normal appearance.  HENT:     Right Ear: Tympanic membrane normal.     Left Ear: Tympanic membrane normal.     Nose: Nose normal.  Cardiovascular:     Rate and Rhythm: Normal rate and regular rhythm.  Pulmonary:     Effort: Pulmonary effort is normal.     Comments: Coarse lung sounds bilaterally Neurological:     Mental Status: He is alert.      No results found for any visits on 04/23/22.      Assessment & Plan:   Problem List Items Addressed This Visit       Respiratory   OSA (obstructive sleep apnea) - Primary    He still has not heard back about the CPAP.  We will call  again.      Other Visit Diagnoses     Acute bronchitis, unspecified organism       Relevant Medications   predniSONE (DELTASONE) 20 MG tablet       Meds ordered this encounter  Medications   predniSONE (DELTASONE) 20 MG tablet    Sig: Take 2 tablets (40 mg total) by mouth daily with breakfast.    Dispense:  10 tablet    Refill:  0    Acute Bronchitis Cough likely bronchitis. No fever, chest pain, or shortness of breath.  Start prednisone.  Drink plenty of fluids and rest. Continue symptom management as needed.  Return if symptoms worsen or fail to improve.  Dorian Heckle, Student-PA

## 2022-04-23 NOTE — Progress Notes (Signed)
   Personally interviewed and examined the patient and agree with assessment and plan below.

## 2022-04-23 NOTE — Assessment & Plan Note (Signed)
He still has not heard back about the CPAP.  We will call again.

## 2022-04-26 ENCOUNTER — Encounter: Payer: Self-pay | Admitting: Family Medicine

## 2022-04-26 MED ORDER — DOXYCYCLINE HYCLATE 100 MG PO TABS: 100.0000 mg | ORAL_TABLET | Freq: Two times a day (BID) | ORAL | 0 refills | Status: DC

## 2022-04-26 NOTE — Telephone Encounter (Signed)
Meds ordered this encounter  Medications   doxycycline (VIBRA-TABS) 100 MG tablet    Sig: Take 1 tablet (100 mg total) by mouth 2 (two) times daily.    Dispense:  14 tablet    Refill:  0

## 2022-04-27 NOTE — Telephone Encounter (Signed)
I sent a message to the front desk to schedule [name] as a new patient with Dr Madilyn Fireman.

## 2022-04-30 ENCOUNTER — Encounter: Payer: Self-pay | Admitting: Family Medicine

## 2022-04-30 DIAGNOSIS — Z461 Encounter for fitting and adjustment of hearing aid: Secondary | ICD-10-CM | POA: Diagnosis not present

## 2022-04-30 DIAGNOSIS — H903 Sensorineural hearing loss, bilateral: Secondary | ICD-10-CM | POA: Diagnosis not present

## 2022-05-10 ENCOUNTER — Other Ambulatory Visit: Payer: Self-pay | Admitting: Family Medicine

## 2022-06-05 DIAGNOSIS — N202 Calculus of kidney with calculus of ureter: Secondary | ICD-10-CM | POA: Diagnosis not present

## 2022-06-05 DIAGNOSIS — N132 Hydronephrosis with renal and ureteral calculous obstruction: Secondary | ICD-10-CM | POA: Diagnosis not present

## 2022-06-05 DIAGNOSIS — N3289 Other specified disorders of bladder: Secondary | ICD-10-CM | POA: Diagnosis not present

## 2022-06-05 DIAGNOSIS — R8271 Bacteriuria: Secondary | ICD-10-CM | POA: Diagnosis not present

## 2022-06-05 DIAGNOSIS — K5730 Diverticulosis of large intestine without perforation or abscess without bleeding: Secondary | ICD-10-CM | POA: Diagnosis not present

## 2022-06-05 DIAGNOSIS — N281 Cyst of kidney, acquired: Secondary | ICD-10-CM | POA: Diagnosis not present

## 2022-06-19 DIAGNOSIS — Z125 Encounter for screening for malignant neoplasm of prostate: Secondary | ICD-10-CM | POA: Diagnosis not present

## 2022-06-19 DIAGNOSIS — N201 Calculus of ureter: Secondary | ICD-10-CM | POA: Diagnosis not present

## 2022-06-23 ENCOUNTER — Other Ambulatory Visit: Payer: Self-pay | Admitting: Family Medicine

## 2022-06-25 ENCOUNTER — Encounter: Payer: Self-pay | Admitting: Family Medicine

## 2022-06-25 ENCOUNTER — Encounter: Payer: Self-pay | Admitting: Cardiology

## 2022-06-25 NOTE — Telephone Encounter (Signed)
Please call company  to get recent download from machine.

## 2022-06-27 ENCOUNTER — Telehealth: Payer: Self-pay | Admitting: Family Medicine

## 2022-06-27 DIAGNOSIS — G4733 Obstructive sleep apnea (adult) (pediatric): Secondary | ICD-10-CM

## 2022-06-27 NOTE — Telephone Encounter (Signed)
LVM advising pt of the results of his CPAP report. Advised that he can either send a my chart or call if he has any questions or concerns.

## 2022-06-27 NOTE — Telephone Encounter (Signed)
Hi [name],   I did receive his his CPAP download report.  He has been pretty consistent with wearing it about 91% of the time which is great.  He is also been wearing it for greater than 4 hours which is at goal.  Current pressure is set to 7 cm of water pressure.  AHI of 2.7 so he is at goal and reducing and controlling his apneas.

## 2022-06-29 ENCOUNTER — Encounter: Payer: Self-pay | Admitting: Family Medicine

## 2022-07-02 ENCOUNTER — Other Ambulatory Visit: Payer: Self-pay | Admitting: Urology

## 2022-07-02 DIAGNOSIS — D4101 Neoplasm of uncertain behavior of right kidney: Secondary | ICD-10-CM

## 2022-07-05 DIAGNOSIS — N5201 Erectile dysfunction due to arterial insufficiency: Secondary | ICD-10-CM | POA: Diagnosis not present

## 2022-07-05 DIAGNOSIS — N200 Calculus of kidney: Secondary | ICD-10-CM | POA: Diagnosis not present

## 2022-07-05 DIAGNOSIS — N281 Cyst of kidney, acquired: Secondary | ICD-10-CM | POA: Diagnosis not present

## 2022-07-05 DIAGNOSIS — R350 Frequency of micturition: Secondary | ICD-10-CM | POA: Diagnosis not present

## 2022-07-05 DIAGNOSIS — N401 Enlarged prostate with lower urinary tract symptoms: Secondary | ICD-10-CM | POA: Diagnosis not present

## 2022-07-06 ENCOUNTER — Ambulatory Visit
Admission: RE | Admit: 2022-07-06 | Discharge: 2022-07-06 | Disposition: A | Discharge status: Home / Self Care | Payer: Self-pay | Source: Ambulatory Visit | Attending: Cardiology | Admitting: Cardiology

## 2022-07-06 ENCOUNTER — Telehealth: Payer: Self-pay | Admitting: Cardiology

## 2022-07-06 DIAGNOSIS — I700 Atherosclerosis of aorta: Secondary | ICD-10-CM

## 2022-07-06 NOTE — Telephone Encounter (Signed)
Calling to make provider aware that their office will be Power Sharing pt's CT results so that Dr. Stanford Breed is able to view them. Please advise

## 2022-07-06 NOTE — Telephone Encounter (Signed)
Spoke with canopy, outside film order placed and they will share the images with [place] in EPIC.

## 2022-07-06 NOTE — Telephone Encounter (Signed)
[name] called back stating she spoke to [facility] and they told her RN would need to put in outside film order in Epic then Dr. Stanford Breed would be able to see CT results. Please advise

## 2022-07-06 NOTE — Telephone Encounter (Signed)
Sent to MD and RN regarding sharing of CT results

## 2022-07-08 ENCOUNTER — Ambulatory Visit
Admission: RE | Admit: 2022-07-08 | Discharge: 2022-07-08 | Disposition: A | Discharge status: Home / Self Care | Payer: Medicare Other | Source: Ambulatory Visit | Attending: Urology | Admitting: Urology

## 2022-07-08 DIAGNOSIS — D4101 Neoplasm of uncertain behavior of right kidney: Secondary | ICD-10-CM

## 2022-07-08 MED ORDER — GADOPICLENOL 0.5 MMOL/ML IV SOLN
7.5000 mL | Freq: Once | INTRAVENOUS | Status: AC | PRN
  Administered 2022-07-08: 7.5 mL via INTRAVENOUS

## 2022-07-09 ENCOUNTER — Encounter: Payer: Self-pay | Admitting: *Deleted

## 2022-07-09 NOTE — Telephone Encounter (Signed)
Results of CT scan received and will have reviewed by dr Stanford Breed

## 2022-07-12 NOTE — Telephone Encounter (Signed)
Spoke with pt, aware CT results reviewed by Dr Stanford Breed. Aortic atherosclerosis is common as we age and his aspirin and atorvastatin is treating this. All questions answered. Patient voiced understanding

## 2022-07-17 ENCOUNTER — Encounter: Payer: Self-pay | Admitting: Cardiology

## 2022-07-25 ENCOUNTER — Other Ambulatory Visit: Payer: Self-pay | Admitting: Family Medicine

## 2022-07-25 DIAGNOSIS — K2100 Gastro-esophageal reflux disease with esophagitis, without bleeding: Secondary | ICD-10-CM

## 2022-08-28 ENCOUNTER — Ambulatory Visit
Admission: EM | Admit: 2022-08-28 | Discharge: 2022-08-28 | Disposition: A | Discharge status: Home / Self Care | Payer: Medicare Other | Attending: Family Medicine | Admitting: Family Medicine

## 2022-08-28 ENCOUNTER — Encounter: Payer: Self-pay | Admitting: Emergency Medicine

## 2022-08-28 DIAGNOSIS — M79644 Pain in right finger(s): Secondary | ICD-10-CM | POA: Diagnosis not present

## 2022-08-28 MED ORDER — IBUPROFEN 600 MG PO TABS: 600.0000 mg | ORAL_TABLET | Freq: Four times a day (QID) | ORAL | 0 refills | Status: DC | PRN

## 2022-08-28 MED ORDER — CEFADROXIL 500 MG PO CAPS: 500.0000 mg | ORAL_CAPSULE | Freq: Two times a day (BID) | ORAL | 0 refills | Status: DC

## 2022-08-28 NOTE — ED Triage Notes (Signed)
New pain in right thumb beginning earlier this morning and worsening into now. Waxing and waning, worse with palpation, throbbing in nature. Denies any trauma to area. Visible line of darkened discoloration extending up middle of nail bed. Full ROM intact, denies numbness/tingling. No visible or palpable mass felt on the anterior tuft. 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 No visible redness or swelling to the surrounding skin observed.

## 2022-08-28 NOTE — ED Provider Notes (Signed)
[name]    CSN: [id number] Arrival date & time: 08/28/22  1632      History   Chief Complaint Chief Complaint  Patient presents with   Finger Injury    HPI KAMARIEN Soto is a 82 y.o. male.   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 Recalls absolutely no trauma to the thumb Over the course of the nay noted pain in the right thumbnail and a small discoloration Tried an epsom salt soak Now any pressure ot touch of the affected nail is exquisitely painful  Past Medical History:  Diagnosis Date   Arthritis    osteoarthritis right hip   Carotid artery plaque    mild   Colon polyps    Erectile dysfunction    GERD (gastroesophageal reflux disease)    history- no current problems   History of nephrolithiasis    episodes x 6    HLD (hyperlipidemia)    HTN (hypertension)    Impaired hearing    bilateral hearing aids   Sleep apnea    cpap used with nasal clip   Thyroid nodule     Patient Active Problem List   Diagnosis Date Noted   OSA (obstructive sleep apnea) 12/26/2021   Acute pain of right shoulder 07/31/2021   Thyroid nodule 01/31/2021   Aortic atherosclerosis 02/12/2020   DDD (degenerative disc disease), lumbar 02/12/2020   Lumbar pars defect - chronic L5 pars fracture 02/12/2020   Asymmetrical hearing loss, bilateral 08/21/2019   Meniere disease, left 08/21/2019   Diverticulosis 04/26/2019   Gastroesophageal reflux disease with esophagitis without hemorrhage 03/20/2019   S/P left THA, AA 10/22/2017   Osteoarthritis of left hip 08/21/2017   ED (erectile dysfunction) 04/17/2017   S/P right THA, AA 11/15/2015   Cough variant asthma 04/03/2015   Upper airway cough syndrome 03/10/2015   Hyperlipidemia 02/12/2007   Essential hypertension 02/12/2007   COLONIC POLYPS, HX OF 02/12/2007   NEPHROLITHIASIS, HX OF 02/12/2007    Past Surgical History:  Procedure Laterality Date   cataract  Bilateral 05/21/2013   CYSTOSCOPY W/ STONE MANIPULATION     x 2   EYE SURGERY  cataracts for both eyes   HEMORRHOID SURGERY     INGUINAL HERNIA REPAIR Right    JOINT REPLACEMENT  2017, 2019   LAPAROSCOPIC DRAINAGE RENAL CYST     LITHOTRIPSY     x 1    TOTAL HIP ARTHROPLASTY Right 11/15/2015   Procedure: RIGHT TOTAL HIP ARTHROPLASTY ANTERIOR APPROACH;  Surgeon: Durene Romans, MD;  Location: [facility];  Service: Orthopedics;  Laterality: Right;   TOTAL HIP ARTHROPLASTY Left 10/22/2017   Procedure: LEFT TOTAL HIP ARTHROPLASTY ANTERIOR APPROACH;  Surgeon: Durene Romans, MD;  Location: [facility];  Service: Orthopedics;  Laterality: Left;       Home Medications    Prior to Admission medications   Medication Sig Start Date End Date Taking? Authorizing Provider  ALPRAZolam Prudy Feeler) 0.5 MG tablet Take 1 tablet (0.5 mg total) by mouth 3 (three) times daily as needed for anxiety. With flying 04/06/22  Yes Agapito Games, MD  AMBULATORY NON FORMULARY MEDICATION Medication Name: CPAP set to 7 cm water pressure.  Current machine has expired.  Please order through [facility]. 03/01/22  Yes Agapito Games, MD  ascorbic acid (VITAMIN C) 1000 MG tablet Take 1,000 mg by mouth daily with supper.    Yes [provider]  aspirin EC  81 MG tablet Take 81 mg by mouth daily.   Yes [provider]  atorvastatin (LIPITOR) 40 MG tablet TAKE 1 TABLET (40 MG TOTAL) BY MOUTH DAILY. LABS FOR FURTHER REFILLS 06/25/22  Yes Agapito GamesMetheney, Catherine D, MD  cefadroxil (DURICEF) 500 MG capsule Take 1 capsule (500 mg total) by mouth 2 (two) times daily. 08/28/22  Yes Eustace MooreNelson, Ikeya Brockel Sue, MD  Cholecalciferol (VITAMIN D3) 2000 units TABS Take 2,000 Units by mouth at bedtime.    Yes [provider]  famotidine (PEPCID) 20 MG tablet TAKE 1 TABLET BY MOUTH EVERYDAY AT BEDTIME 07/25/22  Yes Agapito GamesMetheney, Catherine D, MD  glucosamine-chondroitin 500-400 MG tablet Take 1 tablet by mouth daily with supper.    Yes [provider]  ibuprofen (ADVIL) 600 MG tablet Take 1 tablet (600 mg total) by mouth every 6 (six) hours as needed. 08/28/22  Yes Eustace MooreNelson, Tameika Heckmann Sue, MD  Omega-3 1000 MG CAPS Take 1 tablet by mouth daily.   Yes [provider]  Turmeric-Ginger 135-6 MG CHEW Chew 1 tablet by mouth in the morning and at bedtime. 11/30/21  Yes Agapito GamesMetheney, Catherine D, MD  valsartan-hydrochlorothiazide (DIOVAN-HCT) 80-12.5 MG tablet TAKE 1 TABLET BY MOUTH EVERY DAY 05/10/22  Yes Agapito GamesMetheney, Catherine D, MD  Collagen Hydrolysate POWD Taken daily 11/30/21   Agapito GamesMetheney, Catherine D, MD  DENTA 5000 PLUS 1.1 % CREA dental cream See admin instructions. 01/14/21   [provider]    Family History Family History  Problem Relation Age of Onset   Colon cancer Father    Cancer Father    Heart attack Mother [id number]   Hearing loss Mother    Heart disease Mother    Diabetes Other        1st degree relative    Social History Social History   Tobacco Use   Smoking status: Never   Smokeless tobacco: Never  Vaping Use   Vaping Use: Never used  Substance Use Topics   Alcohol use: No   Drug use: No     Allergies   Morphine and Tadalafil   Review of Systems Review of Systems See HPI  Physical Exam Triage Vital Signs ED Triage Vitals  Enc Vitals Group     BP 08/28/22 1643 131/85     Pulse Rate 08/28/22 1643 66     Resp 08/28/22 1643 16     Temp 08/28/22 1643 97.6 F (36.4 C)     Temp Source 08/28/22 1643 Oral     SpO2 08/28/22 1643 97 %     Weight --      Height --      Head Circumference --      Peak Flow --      Pain Score 08/28/22 1645 8     Pain Loc --      Pain Edu? --      Excl. in GC? --    No data found.  Updated Vital Signs BP 131/85 (BP Location: Right Arm)   Pulse 66   Temp 97.6 F (36.4 C) (Oral)   Resp 16   SpO2 97%     Physical Exam Constitutional:      General: He is not in acute distress.    Appearance: He is well-developed.  HENT:     Head: Normocephalic and  atraumatic.  Eyes:     Conjunctiva/sclera: Conjunctivae normal.     Pupils: Pupils are equal, round, and reactive to light.  Cardiovascular:     Rate and  Rhythm: Normal rate.  Pulmonary:     Effort: Pulmonary effort is normal. No respiratory distress.  Abdominal:     General: There is no distension.     Palpations: Abdomen is soft.  Musculoskeletal:        General: Normal range of motion.     Cervical back: Normal range of motion.  Skin:    General: Skin is warm and dry.     Comments: Right thumbnail is examined.  Nail is slightly hypertrophic, symmetric with the left.  There is a small darkened area that looks like a subungual hematoma on the proximal third of the nail centrally, perhaps 3 to 4 mm across.  Entire nail is exquisitely tender.  Surrounding skin is normal.  Pad is normal.  No soft tissue swelling.  Neurological:     Mental Status: He is alert.      UC Treatments / Results  Labs (all labs ordered are listed, but only abnormal results are displayed) Labs Reviewed - No data to display  EKG   Radiology No results found.  Procedures Procedures (including critical care time)  Medications Ordered in UC Medications - No data to display  Initial Impression / Assessment and Plan / UC Course  I have reviewed the triage vital signs and the nursing notes.  Pertinent labs & imaging results that were available during my care of the patient were reviewed by me and considered in my medical decision making (see chart for details).     This is a difficult situation.  Pain with absolutely no trauma is difficult to diagnose.  The amount of pain makes me concern for infection.  Patient states he is leaving town in a day or 2 and needs to have resolution.  Will give him anti-inflammatories and anti-infectives and have him return if he fails to improve. Final Clinical Impressions(s) / UC Diagnoses   Final diagnoses:  Thumb pain, right     Discharge Instructions       Take the antibiotic cefadroxil 2 x a day Take ibuprofen for pain and inflammation    One tab 3 x a day with food Continue warm soaks Return if it fails to improve   ED Prescriptions     Medication Sig Dispense Auth. Provider   ibuprofen (ADVIL) 600 MG tablet Take 1 tablet (600 mg total) by mouth every 6 (six) hours as needed. 21 tablet Eustace Moore, MD   cefadroxil (DURICEF) 500 MG capsule Take 1 capsule (500 mg total) by mouth 2 (two) times daily. 10 capsule Eustace Moore, MD      PDMP not reviewed this encounter.   Eustace Moore, MD 08/28/22 [phone]

## 2022-08-28 NOTE — Discharge Instructions (Signed)
Take the antibiotic cefadroxil 2 x a day Take ibuprofen for pain and inflammation    One tab 3 x a day with food Continue warm soaks Return if it fails to improve

## 2022-09-17 ENCOUNTER — Other Ambulatory Visit: Payer: Self-pay | Admitting: Family Medicine

## 2022-10-02 ENCOUNTER — Ambulatory Visit (INDEPENDENT_AMBULATORY_CARE_PROVIDER_SITE_OTHER): Payer: Medicare Other

## 2022-10-02 ENCOUNTER — Encounter: Payer: Self-pay | Admitting: Family Medicine

## 2022-10-02 ENCOUNTER — Ambulatory Visit (INDEPENDENT_AMBULATORY_CARE_PROVIDER_SITE_OTHER): Payer: Medicare Other | Admitting: Family Medicine

## 2022-10-02 VITALS — BP 117/55 | HR 70 | Ht 66.0 in | Wt 176.0 lb

## 2022-10-02 DIAGNOSIS — S6991XA Unspecified injury of right wrist, hand and finger(s), initial encounter: Secondary | ICD-10-CM

## 2022-10-02 DIAGNOSIS — S61111A Laceration without foreign body of right thumb with damage to nail, initial encounter: Secondary | ICD-10-CM

## 2022-10-02 DIAGNOSIS — M79644 Pain in right finger(s): Secondary | ICD-10-CM | POA: Diagnosis not present

## 2022-10-02 MED ORDER — SULFAMETHOXAZOLE-TRIMETHOPRIM 800-160 MG PO TABS: 1.0000 | ORAL_TABLET | Freq: Two times a day (BID) | ORAL | 0 refills | Status: DC

## 2022-10-02 NOTE — Progress Notes (Addendum)
   Acute Office Visit  Subjective:     Patient ID: [name], male    DOB: April 22, 1941, 82 y.o.   MRN: [id number]  Chief Complaint  Patient presents with   Nail Problem    HPI Patient is in today for possible infection on Right thumb.  He was seen at urgent care about 5 weeks ago.  He does not remember specific trauma but he does throw pottery and it could have potentially been injured at that point.  Noticed some discoloration underneath the nail at that point in time.  When antibiotics, ibuprofen and encouraged to do soaks.  ROS      Objective:    BP (!) 117/55   Pulse 70   Ht 5\' 6"  (1.676 m)   Wt 176 lb (79.8 kg)   SpO2 99%   BMI 28.41 kg/m    Physical Exam Skin:    Comments: Discoloration under the nail bed and the skin looks more pale. Mild swelling. NROM of the thumb. Nontender over the joints.       No results found for any visits on 10/02/22.      Assessment & Plan:   Problem List Items Addressed This Visit   None Visit Diagnoses     Injury of right thumbnail, initial encounter    -  Primary   Relevant Orders   DG Finger Thumb Right      Will xray and start Bactrim to cover for staph. Call if not better in one week. Will contact with results.   Meds ordered this encounter  Medications   sulfamethoxazole-trimethoprim (BACTRIM DS) 800-160 MG tablet    Sig: Take 1 tablet by mouth 2 (two) times daily.    Dispense:  14 tablet    Refill:  0    Return if symptoms worsen or fail to improve.  Nani Gasser, MD

## 2022-10-04 ENCOUNTER — Encounter: Payer: Self-pay | Admitting: Family Medicine

## 2022-10-05 NOTE — Progress Notes (Signed)
Hi [name], they did not see any evidence of soft tissue swelling or infection of the bone.  You do have a lot of arthritis in that finger.  But nothing concerning deeper into the tissue which is great.  Hopefully the pain is improving.

## 2022-10-05 NOTE — Telephone Encounter (Signed)
Please call imaging and see if there would be away from them to go ahead and read his x-ray.  It is already been 3 days.

## 2022-10-05 NOTE — Telephone Encounter (Signed)
Changed to stat read

## 2022-10-09 ENCOUNTER — Encounter: Payer: Self-pay | Admitting: Family Medicine

## 2022-10-14 DIAGNOSIS — Z961 Presence of intraocular lens: Secondary | ICD-10-CM | POA: Insufficient documentation

## 2022-10-14 DIAGNOSIS — H18613 Keratoconus, stable, bilateral: Secondary | ICD-10-CM | POA: Insufficient documentation

## 2022-10-16 ENCOUNTER — Encounter: Payer: Self-pay | Admitting: Medical-Surgical

## 2022-10-16 ENCOUNTER — Ambulatory Visit (INDEPENDENT_AMBULATORY_CARE_PROVIDER_SITE_OTHER): Payer: Medicare Other

## 2022-10-16 ENCOUNTER — Ambulatory Visit (INDEPENDENT_AMBULATORY_CARE_PROVIDER_SITE_OTHER): Payer: Medicare Other | Admitting: Medical-Surgical

## 2022-10-16 VITALS — BP 128/65 | HR 75 | Temp 98.5°F | Resp 20 | Ht 66.0 in | Wt 179.0 lb

## 2022-10-16 DIAGNOSIS — J329 Chronic sinusitis, unspecified: Secondary | ICD-10-CM

## 2022-10-16 DIAGNOSIS — J40 Bronchitis, not specified as acute or chronic: Secondary | ICD-10-CM

## 2022-10-16 DIAGNOSIS — R0789 Other chest pain: Secondary | ICD-10-CM

## 2022-10-16 DIAGNOSIS — S6991XA Unspecified injury of right wrist, hand and finger(s), initial encounter: Secondary | ICD-10-CM

## 2022-10-16 MED ORDER — AZITHROMYCIN 250 MG PO TABS: ORAL_TABLET | ORAL | 0 refills | Status: AC

## 2022-10-16 NOTE — Progress Notes (Signed)
        Established patient visit  History, exam, impression, and plan:  1. Sinobronchitis 2. Chest heaviness Pleasant 82 year old male presenting today with reports of 1.5 weeks of upper respiratory symptoms including sinus congestion, postnasal drip, mild deep cough, and hoarse voice.  Notes that his wife was sick with similar symptoms and he appears to have caught them as well.  After a week and a half, he has now developed chest heaviness and reports that he feels congestion deep in his chest when he coughs.  Denies fever, chills, nausea, vomiting, shortness of breath, and chest pain.  Has been using Mucinex and Tylenol which seem to help some with his symptoms however if he does not take these around-the-clock, he starts feeling awful again.  See below for physical exam.  Getting chest x-ray for further evaluation of potential pneumonia.  After 10 days of symptoms, treating with azithromycin.  Okay to continue Tylenol and Mucinex as prescribed. - DG Chest 2 View; Future  3. Injury of right thumbnail, initial encounter Patient briefly discussed the issue with his right thumbnail.  Since there has been no improvement after 2 rounds of antibiotics and he continues to have thumbnail discoloration and right thumb swelling, he would like to have a referral to dermatology.  Referral placed today. - Ambulatory referral to Dermatology   Procedures performed this visit: None.  Return if symptoms worsen or fail to improve.  __________________________________ Thayer Ohm, DNP, APRN, FNP-BC Primary Care and Sports Medicine [facility]

## 2022-10-16 NOTE — Telephone Encounter (Signed)
To schedule.  I do not know if the 340 is late enough for him since he is watching his grandson.

## 2022-10-16 NOTE — Telephone Encounter (Signed)
I called patient to schedule. He will call us back.

## 2022-10-17 DIAGNOSIS — Z961 Presence of intraocular lens: Secondary | ICD-10-CM | POA: Diagnosis not present

## 2022-10-17 DIAGNOSIS — H18613 Keratoconus, stable, bilateral: Secondary | ICD-10-CM | POA: Diagnosis not present

## 2022-10-19 ENCOUNTER — Other Ambulatory Visit: Payer: Self-pay | Admitting: Family Medicine

## 2022-10-19 DIAGNOSIS — K2100 Gastro-esophageal reflux disease with esophagitis, without bleeding: Secondary | ICD-10-CM

## 2022-11-05 DIAGNOSIS — L814 Other melanin hyperpigmentation: Secondary | ICD-10-CM | POA: Diagnosis not present

## 2022-11-05 DIAGNOSIS — L578 Other skin changes due to chronic exposure to nonionizing radiation: Secondary | ICD-10-CM | POA: Diagnosis not present

## 2022-11-05 DIAGNOSIS — B372 Candidiasis of skin and nail: Secondary | ICD-10-CM | POA: Diagnosis not present

## 2022-11-05 DIAGNOSIS — D485 Neoplasm of uncertain behavior of skin: Secondary | ICD-10-CM | POA: Diagnosis not present

## 2022-11-11 ENCOUNTER — Other Ambulatory Visit: Payer: Self-pay | Admitting: Family Medicine

## 2022-11-19 ENCOUNTER — Encounter: Payer: Self-pay | Admitting: Family Medicine

## 2022-11-21 ENCOUNTER — Encounter: Payer: Self-pay | Admitting: Cardiology

## 2022-11-23 MED ORDER — ROSUVASTATIN CALCIUM 20 MG PO TABS: 20.0000 mg | ORAL_TABLET | Freq: Every day | ORAL | 3 refills | Status: DC

## 2022-11-23 NOTE — Telephone Encounter (Signed)
Called pt to discuss, spoke with his wife. Efraim Kaufmann is out of the office. Will change pt from atorvastatin 40mg  daily to equivalent dose of rosuvastatin 20mg  daily. This will avoid drug interaction with his fluconazole. I have also added fluconazole to his med list. Pt and wife appreciative for the call.

## 2022-11-28 ENCOUNTER — Ambulatory Visit (INDEPENDENT_AMBULATORY_CARE_PROVIDER_SITE_OTHER): Payer: Medicare Other | Admitting: Family Medicine

## 2022-11-28 ENCOUNTER — Encounter: Payer: Self-pay | Admitting: Family Medicine

## 2022-11-28 VITALS — BP 108/52 | HR 89 | Temp 99.3°F | Ht 66.0 in | Wt 179.0 lb

## 2022-11-28 DIAGNOSIS — U071 COVID-19: Secondary | ICD-10-CM

## 2022-11-28 DIAGNOSIS — R051 Acute cough: Secondary | ICD-10-CM | POA: Diagnosis not present

## 2022-11-28 DIAGNOSIS — R509 Fever, unspecified: Secondary | ICD-10-CM

## 2022-11-28 LAB — POCT INFLUENZA A/B
Influenza A, POC: NEGATIVE
Influenza B, POC: NEGATIVE

## 2022-11-28 LAB — POC COVID19 BINAXNOW: SARS Coronavirus 2 Ag: POSITIVE — AB

## 2022-11-28 MED ORDER — NIRMATRELVIR/RITONAVIR (PAXLOVID)TABLET: 3.0000 | ORAL_TABLET | Freq: Two times a day (BID) | ORAL | 0 refills | Status: AC

## 2022-11-28 NOTE — Progress Notes (Signed)
Pt reports that his sxs began on Saturday after he was at [employer].

## 2022-11-28 NOTE — Progress Notes (Signed)
Acute Office Visit  Subjective:     Patient ID: [name], male    DOB: 12-20-1940, 82 y.o.   MRN: [id number]  Chief Complaint  Patient presents with   Cough   Fever    HPI Patient is in today for fever and cough. Started about 3 days ago. Sunday temp was 103. 5. No ST or HA. No facial pain or pressure. No ear pain. Using tessalon perles TID x 3 days.  Using Tylenol and Mucinex.  Clear runny nose.  No shortness of breath.  ROS      Objective:    BP (!) 108/52   Pulse 89   Temp 99.3 F (37.4 C)   Ht 5\' 6"  (1.676 m)   Wt 179 lb (81.2 kg)   SpO2 98%   BMI 28.89 kg/m    Physical Exam Constitutional:      Appearance: He is well-developed.  HENT:     Head: Normocephalic and atraumatic.     Right Ear: Tympanic membrane, ear canal and external ear normal.     Left Ear: Tympanic membrane, ear canal and external ear normal.     Nose: Nose normal.     Mouth/Throat:     Pharynx: Oropharynx is clear.  Eyes:     Conjunctiva/sclera: Conjunctivae normal.     Pupils: Pupils are equal, round, and reactive to light.  Neck:     Thyroid: No thyromegaly.  Cardiovascular:     Rate and Rhythm: Normal rate.     Heart sounds: Normal heart sounds.  Pulmonary:     Effort: Pulmonary effort is normal.     Breath sounds: Normal breath sounds. No wheezing.     Comments: Crackles at the base bilaterally.  Musculoskeletal:     Cervical back: Neck supple.  Lymphadenopathy:     Cervical: No cervical adenopathy.  Skin:    General: Skin is warm and dry.  Neurological:     Mental Status: He is alert and oriented to person, place, and time.     Results for orders placed or performed in visit on 11/28/22  POCT Influenza A/B  Result Value Ref Range   Influenza A, POC Negative Negative   Influenza B, POC Negative Negative  POC COVID-19  Result Value Ref Range   SARS Coronavirus 2 Ag Positive (A) Negative        Assessment & Plan:   Problem List Items Addressed This Visit    None Visit Diagnoses     Fever, unspecified fever cause    -  Primary   Relevant Orders   POCT Influenza A/B (Completed)   POC COVID-19 (Completed)   Acute cough       COVID-19       Relevant Medications   nirmatrelvir/ritonavir (PAXLOVID) 20 x 150 MG & 10 x 100MG  TABS      Swab positive for COVID today.  Discussed diagnosis and precautions.  Will treat with Paxlovid since he is 25 and high risk.  If he is not improving over the next couple of days or develops increased fever or shortness of breath then please let us know.  Meds ordered this encounter  Medications   nirmatrelvir/ritonavir (PAXLOVID) 20 x 150 MG & 10 x 100MG  TABS    Sig: Take 3 tablets by mouth 2 (two) times daily for 5 days. (Take nirmatrelvir 150 mg two tablets twice daily for 5 days and ritonavir 100 mg one tablet twice daily for 5 days) Patient GFR is  62    Dispense:  30 tablet    Refill:  0    No follow-ups on file.  Nani Gasser, MD

## 2022-11-29 ENCOUNTER — Encounter: Payer: Self-pay | Admitting: Family Medicine

## 2022-11-30 MED ORDER — BENZONATATE 200 MG PO CAPS: 200.0000 mg | ORAL_CAPSULE | Freq: Three times a day (TID) | ORAL | 0 refills | Status: DC | PRN

## 2022-11-30 NOTE — Telephone Encounter (Signed)
Meds ordered this encounter  Medications   benzonatate (TESSALON) 200 MG capsule    Sig: Take 1 capsule (200 mg total) by mouth 3 (three) times daily as needed for cough.    Dispense:  45 capsule    Refill:  0

## 2022-12-03 ENCOUNTER — Encounter: Payer: Self-pay | Admitting: Family Medicine

## 2022-12-05 ENCOUNTER — Other Ambulatory Visit: Payer: Self-pay | Admitting: Family Medicine

## 2023-01-18 ENCOUNTER — Other Ambulatory Visit: Payer: Self-pay | Admitting: Family Medicine

## 2023-01-18 DIAGNOSIS — K2100 Gastro-esophageal reflux disease with esophagitis, without bleeding: Secondary | ICD-10-CM

## 2023-02-20 DIAGNOSIS — M25511 Pain in right shoulder: Secondary | ICD-10-CM | POA: Diagnosis not present

## 2023-02-26 ENCOUNTER — Ambulatory Visit (INDEPENDENT_AMBULATORY_CARE_PROVIDER_SITE_OTHER): Payer: Medicare Other | Admitting: Family Medicine

## 2023-02-26 VITALS — BP 111/68 | HR 73 | Ht 65.0 in | Wt 172.0 lb

## 2023-02-26 DIAGNOSIS — Z0000 Encounter for general adult medical examination without abnormal findings: Secondary | ICD-10-CM

## 2023-02-26 NOTE — Progress Notes (Signed)
MEDICARE ANNUAL WELLNESS VISIT  02/26/2023  Subjective:  [name] is a 82 y.o. male patient of Metheney, Barbarann Ehlers, MD who had a Medicare Annual Wellness Visit today. Shiv is Retired and lives with their spouse. he has 3 children. he reports that he is socially active and does interact with friends/family regularly. he is moderately physically active and enjoys He enjoys pottery, reading and volunteering .  Patient Care Team: Agapito Games, MD as PCP - General (Family Medicine)     02/26/2023    2:08 PM 02/14/2022    8:19 AM 02/08/2021   10:14 AM 07/28/2019    4:00 PM 10/23/2017    9:00 AM 10/22/2017   11:42 AM 10/16/2017   10:32 AM  Advanced Directives  Does Patient Have a Medical Advance Directive? Yes Yes Yes Yes Yes Yes Yes  Type of Advance Directive Living will Living will Healthcare Power of [place];Living will Healthcare Power of [place];Living will Healthcare Power of [place];Living will Healthcare Power of [place];Living will Healthcare Power of [place];Living will  Does patient want to make changes to medical advance directive? No - Patient declined No - Patient declined No - Patient declined No - Patient declined No - Patient declined No - Patient declined No - Patient declined  Copy of Healthcare Power of Attorney in Chart?   Yes - validated most recent copy scanned in chart (See row information) No - copy requested No - copy requested No - copy requested No - copy requested    Hospital Utilization Over the Past 12 Months: # of hospitalizations or ER visits: 0 # of surgeries: 0  Review of Systems    Patient reports that his overall health is unchanged when compared to last year.  Review of Systems: History obtained from chart review and the patient  All other systems negative.  Pain Assessment Pain : No/denies pain     Current Medications & Allergies (verified) Allergies as of 02/26/2023       Reactions   Morphine Nausea And Vomiting    Tadalafil Other (See Comments)   Limb pain        Medication List        Accurate as of February 26, 2023  2:24 PM. If you have any questions, ask your nurse or doctor.          STOP taking these medications    benzonatate 200 MG capsule Commonly known as: TESSALON   rosuvastatin 20 MG tablet Commonly known as: CRESTOR       TAKE these medications    AMBULATORY NON FORMULARY MEDICATION Medication Name: CPAP set to 7 cm water pressure.  Current machine has expired.  Please order through [facility].   ascorbic acid 1000 MG tablet Commonly known as: VITAMIN C Take 1,000 mg by mouth daily with supper.   aspirin EC 81 MG tablet Take 81 mg by mouth daily.   atorvastatin 40 MG tablet Commonly known as: LIPITOR Take 40 mg by mouth daily.   Denta 5000 Plus 1.1 % Crea dental cream Generic drug: sodium fluoride See admin instructions.   famotidine 20 MG tablet Commonly known as: PEPCID TAKE 1 TABLET BY MOUTH EVERYDAY AT BEDTIME   glucosamine-chondroitin 500-400 MG tablet Take 1 tablet by mouth daily with supper.   Omega-3 1000 MG Caps Take 1 tablet by mouth daily.   Turmeric-Ginger 135-6 MG Chew Chew 1 tablet by mouth in the morning and at bedtime.   valsartan-hydrochlorothiazide 80-12.5 MG tablet Commonly known as: DIOVAN-HCT  Take 1 tablet by mouth daily.   Vitamin D3 50 MCG (2000 UT) Tabs Take 2,000 Units by mouth at bedtime.        History (reviewed): Past Medical History:  Diagnosis Date   Arthritis    osteoarthritis right hip   Carotid artery plaque    mild   Colon polyps    Erectile dysfunction    GERD (gastroesophageal reflux disease)    history- no current problems   History of nephrolithiasis    episodes x 6    HLD (hyperlipidemia)    HTN (hypertension)    Impaired hearing    bilateral hearing aids   Sleep apnea    cpap used with nasal clip   Thyroid nodule    Past Surgical History:  Procedure Laterality Date   cataract Bilateral  05/21/2013   CYSTOSCOPY W/ STONE MANIPULATION     x 2   EYE SURGERY  cataracts for both eyes   HEMORRHOID SURGERY     INGUINAL HERNIA REPAIR Right    JOINT REPLACEMENT  2017, 2019   LAPAROSCOPIC DRAINAGE RENAL CYST     LITHOTRIPSY     x 1    TOTAL HIP ARTHROPLASTY Right 11/15/2015   Procedure: RIGHT TOTAL HIP ARTHROPLASTY ANTERIOR APPROACH;  Surgeon: Durene Romans, MD;  Location: [facility];  Service: Orthopedics;  Laterality: Right;   TOTAL HIP ARTHROPLASTY Left 10/22/2017   Procedure: LEFT TOTAL HIP ARTHROPLASTY ANTERIOR APPROACH;  Surgeon: Durene Romans, MD;  Location: [facility];  Service: Orthopedics;  Laterality: Left;   Family History  Problem Relation Age of Onset   Colon cancer Father    Cancer Father    Heart attack Mother 28   Hearing loss Mother    Heart disease Mother    Diabetes Other        1st degree relative   Social History   Socioeconomic History   Marital status: Married    Spouse name: Eber Jones    Number of children: 3   Years of education: 18   Highest education level: Master's degree (e.g., MA, MS, MEng, MEd, MSW, MBA)  Occupational History   Occupation: Retired     Comment: Curator   Occupation: teaches pottery    Comment: one day a week  Tobacco Use   Smoking status: Never   Smokeless tobacco: Never  Vaping Use   Vaping status: Never Used  Substance and Sexual Activity   Alcohol use: No   Drug use: No   Sexual activity: Yes    Birth control/protection: None  Other Topics Concern   Not on file  Social History Narrative   Lives with spouse. He has three children. 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 He now teaches pottery in [place] through the [employer]. He enjoys pottery, reading and volunteering.   Social Determinants of Health   Financial Resource Strain: Low Risk   (02/25/2023)   Overall Financial Resource Strain (CARDIA)    Difficulty of Paying Living Expenses: Not hard at all  Food Insecurity: No Food Insecurity (02/25/2023)   Hunger Vital Sign    Worried About Running Out of Food in the Last Year: Never true    Ran Out of Food in the Last Year: Never true  Transportation Needs: No Transportation Needs (02/25/2023)   PRAPARE - Transportation    Lack of  Transportation (Medical): No    Lack of Transportation (Non-Medical): No  Physical Activity: Insufficiently Active (02/25/2023)   Exercise Vital Sign    Days of Exercise per Week: 2 days    Minutes of Exercise per Session: 60 min  Stress: Stress Concern Present (02/25/2023)   [employer] of Occupational Health - Occupational Stress Questionnaire    Feeling of Stress : To some extent  Social Connections: Socially Integrated (02/26/2023)   Social Connection and Isolation Panel [NHANES]    Frequency of Communication with Friends and Family: Three times a week    Frequency of Social Gatherings with Friends and Family: Once a week    Attends Religious Services: More than 4 times per year    Active Member of [employer] or Organizations: Yes    Attends Banker Meetings: More than 4 times per year    Marital Status: Married    Activities of Daily Living    02/25/2023    2:57 PM  In your present state of health, do you have any difficulty performing the following activities:  Hearing? 1  Vision? 0  Difficulty concentrating or making decisions? 0  Walking or climbing stairs? 0  Dressing or bathing? 0  Doing errands, shopping? 0  Preparing Food and eating ? N  Using the Toilet? N  In the past six months, have you accidently leaked urine? Y  Do you have problems with loss of bowel control? N  Managing your Medications? N  Managing your Finances? N  Housekeeping or managing your Housekeeping? N    Patient Education/Literacy How often do you need to have someone help you when you read  instructions, pamphlets, or other written materials from your doctor or pharmacy?: 1 - Never What is the last grade level you completed in school?: masters degree  Exercise    Diet Patient reports consuming 3 meals a day and 1 snack(s) a day Patient reports that his primary diet is: Regular Patient reports that she does have regular access to food.   Depression Screen    02/26/2023    2:04 PM 04/23/2022   10:55 AM 03/01/2022    8:58 AM 02/14/2022    8:22 AM 10/27/2021    1:19 PM 07/31/2021    9:00 AM 02/08/2021   10:14 AM  PHQ 2/9 Scores  PHQ - 2 Score 0 0 0 0 0 0 0     Fall Risk    02/26/2023    2:04 PM 02/25/2023    2:57 PM 04/23/2022   10:54 AM 03/01/2022    8:57 AM 02/14/2022    8:22 AM  Fall Risk   Falls in the past year? 0 0 0 1 0  Number falls in past yr: 0  0 0 0  Injury with Fall? 0  0 0 0  Risk for fall due to : No Fall Risks  History of fall(s) History of fall(s) No Fall Risks  Follow up Falls evaluation completed  Falls evaluation completed Falls evaluation completed Falls evaluation completed     Objective:   BP 111/68 (BP Location: Right Arm, Patient Position: Sitting, Cuff Size: Normal)   Pulse 73   Ht 5\' 5"  (1.651 m)   Wt 172 lb 0.6 oz (78 kg)   SpO2 97%   BMI 28.63 kg/m   Last Weight  Most recent update: 02/26/2023  2:00 PM    Weight  78 kg (172 lb 0.6 oz)  Body mass index is 28.63 kg/m.  Hearing/Vision  Daelin did not have difficulty with hearing/understanding during the face-to-face interview Ireoluwa did not have difficulty with his vision during the face-to-face interview Reports that he has had a formal eye exam by an eye care professional within the past year Reports that he had had a formal hearing evaluation within the past year  Cognitive Function:    02/26/2023    2:11 PM 02/14/2022    8:23 AM 02/08/2021   10:20 AM 07/28/2019    4:03 PM  6CIT Screen  What Year? 0 points 0 points 0 points 0 points  What month? 0 points 0  points 0 points 0 points  What time? 0 points 0 points 0 points 0 points  Count back from 20 0 points 0 points 0 points 0 points  Months in reverse 0 points 0 points 0 points 0 points  Repeat phrase 0 points 0 points 0 points 2 points  Total Score 0 points 0 points 0 points 2 points    Normal Cognitive Function Screening: Yes (Normal:0-7, Significant for Dysfunction: >8)  Immunization & Health Maintenance Record Immunization History  Administered Date(s) Administered   Fluad Quad(high Dose 65+) 03/20/2019, 03/21/2020, 03/17/2021, 03/01/2022   Influenza Split 05/02/2011   Influenza Whole 03/07/2009, 04/26/2010   Influenza, High Dose Seasonal PF 03/08/2017, 03/15/2018   Influenza,inj,Quad PF,6+ Mos 05/09/2015   Influenza-Unspecified 02/18/2014   PFIZER(Purple Top)SARS-COV-2 Vaccination 06/26/2019, 07/22/2019   PPD Test 04/25/2015   Pneumococcal Conjugate-13 02/12/2007, 08/23/2014   Pneumococcal Polysaccharide-23 03/07/2009   Td 07/16/2003   Tdap 10/05/2013   Zoster Recombinant(Shingrix) 02/22/2021, 10/28/2021   Zoster, Live 01/06/2008    Health Maintenance  Topic Date Due   COVID-19 Vaccine (3 - 2023-24 season) 03/14/2023 (Originally 01/20/2023)   INFLUENZA VACCINE  03/21/2024 (Originally 12/20/2022)   DTaP/Tdap/Td (3 - Td or Tdap) 10/06/2023   Medicare Annual Wellness (AWV)  02/26/2024   Pneumonia Vaccine 2+ Years old  Completed   Zoster Vaccines- Shingrix  Completed   HPV VACCINES  Aged Out   Hepatitis C Screening  Discontinued       Assessment  This is a routine wellness examination for [employer].  Health Maintenance: Due or Overdue There are no preventive care reminders to display for this patient.   [facility] [place] does not need a referral for Community Assistance: Care Management:   no Social Work:    no Prescription Assistance:  no Nutrition/Diabetes Education:  no   Plan:  Personalized Goals  Goals Addressed               This Visit's  Progress     Patient Stated (pt-stated)        Patient stated that he would like to continue to maintain his current healthy lifestyle.       Personalized Health Maintenance & Screening Recommendations  Influenza vaccine Colonoscopy due December 2025 Dr. Kinnie Scales  Lung Cancer Screening Recommended: no (Low Dose CT Chest recommended if Age 36-80 years, 20 pack-year currently smoking OR have quit w/in past 15 years) Hepatitis C Screening recommended: no HIV Screening recommended: no  Advanced Directives: Written information was not given per the patient's request.  Referrals & Orders No orders of the defined types were placed in this encounter.   Follow-up Plan Follow-up with Agapito Games, MD as planned Schedule influenza vaccine when you are ready.  Medicare wellness visit in one year.  AVS printed and given to the patient.   I  have personally reviewed and noted the following in the patient's chart:   Medical and social history Use of alcohol, tobacco or illicit drugs  Current medications and supplements Functional ability and status Nutritional status Physical activity Advanced directives List of other physicians Hospitalizations, surgeries, and ER visits in previous 12 months Vitals Screenings to include cognitive, depression, and falls Referrals and appointments  In addition, I have reviewed and discussed with patient certain preventive protocols, quality metrics, and best practice recommendations. A written personalized care plan for preventive services as well as general preventive health recommendations were provided to patient.     Modesto Charon, RN BSN  02/26/2023

## 2023-02-26 NOTE — Patient Instructions (Addendum)
MEDICARE ANNUAL WELLNESS VISIT Health Maintenance Summary and Written Plan of Care  [name] ,  Thank you for allowing me to perform your Medicare Annual Wellness Visit and for your ongoing commitment to your health.   Health Maintenance & Immunization History Health Maintenance  Topic Date Due   COVID-19 Vaccine (3 - 2023-24 season) 03/14/2023 (Originally 01/20/2023)   INFLUENZA VACCINE  03/21/2024 (Originally 12/20/2022)   DTaP/Tdap/Td (3 - Td or Tdap) 10/06/2023   Medicare Annual Wellness (AWV)  02/26/2024   Pneumonia Vaccine 109+ Years old  Completed   Zoster Vaccines- Shingrix  Completed   HPV VACCINES  Aged Out   Hepatitis C Screening  Discontinued   Immunization History  Administered Date(s) Administered   Fluad Quad(high Dose 65+) 03/20/2019, 03/21/2020, 03/17/2021, 03/01/2022   Influenza Split 05/02/2011   Influenza Whole 03/07/2009, 04/26/2010   Influenza, High Dose Seasonal PF 03/08/2017, 03/15/2018   Influenza,inj,Quad PF,6+ Mos 05/09/2015   Influenza-Unspecified 02/18/2014   PFIZER(Purple Top)SARS-COV-2 Vaccination 06/26/2019, 07/22/2019   PPD Test 04/25/2015   Pneumococcal Conjugate-13 02/12/2007, 08/23/2014   Pneumococcal Polysaccharide-23 03/07/2009   Td 07/16/2003   Tdap 10/05/2013   Zoster Recombinant(Shingrix) 02/22/2021, 10/28/2021   Zoster, Live 01/06/2008    These are the patient goals that we discussed:  Goals Addressed               This Visit's Progress     Patient Stated (pt-stated)        Patient stated that he would like to continue to maintain his current healthy lifestyle.         This is a list of Health Maintenance Items that are overdue or due now: Influenza vaccine Colonoscopy due December 2025 Dr. Kinnie Scales  Orders/Referrals Placed Today: No orders of the defined types were placed in this encounter.  (Contact our referral department at [phone] if you have not spoken with someone about your referral appointment within  the next 5 days)    Follow-up Plan Follow-up with Agapito Games, MD as planned Schedule influenza vaccine when you are ready.  Medicare wellness visit in one year.  AVS printed and given to the patient.      Health Maintenance, Male Adopting a healthy lifestyle and getting preventive care are important in promoting health and wellness. Ask your health care provider about: The right schedule for you to have regular tests and exams. Things you can do on your own to prevent diseases and keep yourself healthy. What should I know about diet, weight, and exercise? Eat a healthy diet  Eat a diet that includes plenty of vegetables, fruits, low-fat dairy products, and lean protein. Do not eat a lot of foods that are high in solid fats, added sugars, or sodium. Maintain a healthy weight Body mass index (BMI) is a measurement that can be used to identify possible weight problems. It estimates body fat based on height and weight. Your health care provider can help determine your BMI and help you achieve or maintain a healthy weight. Get regular exercise Get regular exercise. This is one of the most important things you can do for your health. Most adults should: Exercise for at least 150 minutes each week. The exercise should increase your heart rate and make you sweat (moderate-intensity exercise). Do strengthening exercises at least twice a week. This is in addition to the moderate-intensity exercise. Spend less time sitting. Even light physical activity can be beneficial. Watch cholesterol and blood lipids Have your blood tested for lipids and cholesterol  at 81 years of age, then have this test every 5 years. You may need to have your cholesterol levels checked more often if: Your lipid or cholesterol levels are high. You are older than 81 years of age. You are at high risk for heart disease. What should I know about cancer screening? Many types of cancers can be detected early and  may often be prevented. Depending on your health history and family history, you may need to have cancer screening at various ages. This may include screening for: Colorectal cancer. Prostate cancer. Skin cancer. Lung cancer. What should I know about heart disease, diabetes, and high blood pressure? Blood pressure and heart disease High blood pressure causes heart disease and increases the risk of stroke. This is more likely to develop in people who have high blood pressure readings or are overweight. Talk with your health care provider about your target blood pressure readings. Have your blood pressure checked: Every 3-5 years if you are 34-31 years of age. Every year if you are 48 years old or older. If you are between the ages of 97 and 80 and are a current or former smoker, ask your health care provider if you should have a one-time screening for abdominal aortic aneurysm (AAA). Diabetes Have regular diabetes screenings. This checks your fasting blood sugar level. Have the screening done: Once every three years after age 81 if you are at a normal weight and have a low risk for diabetes. More often and at a younger age if you are overweight or have a high risk for diabetes. What should I know about preventing infection? Hepatitis B If you have a higher risk for hepatitis B, you should be screened for this virus. Talk with your health care provider to find out if you are at risk for hepatitis B infection. Hepatitis C Blood testing is recommended for: Everyone born from [birth year] through 1964. Anyone with known risk factors for hepatitis C. Sexually transmitted infections (STIs) You should be screened each year for STIs, including gonorrhea and chlamydia, if: You are sexually active and are younger than 81 years of age. You are older than 81 years of age and your health care provider tells you that you are at risk for this type of infection. Your sexual activity has changed since you were  last screened, and you are at increased risk for chlamydia or gonorrhea. Ask your health care provider if you are at risk. Ask your health care provider about whether you are at high risk for HIV. Your health care provider may recommend a prescription medicine to help prevent HIV infection. If you choose to take medicine to prevent HIV, you should first get tested for HIV. You should then be tested every 3 months for as long as you are taking the medicine. Follow these instructions at home: Alcohol use Do not drink alcohol if your health care provider tells you not to drink. If you drink alcohol: Limit how much you have to 0-2 drinks a day. Know how much alcohol is in your drink. In the [place] one drink equals one 12 oz bottle of beer (355 mL), one 5 oz glass of wine (148 mL), or one 1 oz glass of hard liquor (44 mL). Lifestyle Do not use any products that contain nicotine or tobacco. These products include cigarettes, chewing tobacco, and vaping devices, such as e-cigarettes. If you need help quitting, ask your health care provider. Do not use street drugs. Do not share needles. Ask your  health care provider for help if you need support or information about quitting drugs. General instructions Schedule regular health, dental, and eye exams. Stay current with your vaccines. Tell your health care provider if: You often feel depressed. You have ever been abused or do not feel safe at home. Summary Adopting a healthy lifestyle and getting preventive care are important in promoting health and wellness. Follow your health care provider's instructions about healthy diet, exercising, and getting tested or screened for diseases. Follow your health care provider's instructions on monitoring your cholesterol and blood pressure. This information is not intended to replace advice given to you by your health care provider. Make sure you discuss any questions you have with your health care  provider. Document Revised: 09/26/2020 Document Reviewed: 09/26/2020 Elsevier Patient Education  2024 [employer].

## 2023-03-10 ENCOUNTER — Other Ambulatory Visit: Payer: Self-pay | Admitting: Family Medicine

## 2023-03-18 DIAGNOSIS — Z23 Encounter for immunization: Secondary | ICD-10-CM | POA: Diagnosis not present

## 2023-04-05 DIAGNOSIS — M25511 Pain in right shoulder: Secondary | ICD-10-CM | POA: Diagnosis not present

## 2023-04-18 ENCOUNTER — Other Ambulatory Visit: Payer: Self-pay | Admitting: Family Medicine

## 2023-04-18 DIAGNOSIS — K2100 Gastro-esophageal reflux disease with esophagitis, without bleeding: Secondary | ICD-10-CM

## 2023-06-12 ENCOUNTER — Other Ambulatory Visit: Payer: Self-pay | Admitting: Family Medicine

## 2023-07-10 DIAGNOSIS — R350 Frequency of micturition: Secondary | ICD-10-CM | POA: Diagnosis not present

## 2023-07-10 DIAGNOSIS — N401 Enlarged prostate with lower urinary tract symptoms: Secondary | ICD-10-CM | POA: Diagnosis not present

## 2023-07-10 DIAGNOSIS — N5201 Erectile dysfunction due to arterial insufficiency: Secondary | ICD-10-CM | POA: Diagnosis not present

## 2023-07-16 DIAGNOSIS — D2371 Other benign neoplasm of skin of right lower limb, including hip: Secondary | ICD-10-CM | POA: Diagnosis not present

## 2023-07-16 DIAGNOSIS — L309 Dermatitis, unspecified: Secondary | ICD-10-CM | POA: Diagnosis not present

## 2023-07-16 DIAGNOSIS — L821 Other seborrheic keratosis: Secondary | ICD-10-CM | POA: Diagnosis not present

## 2023-07-16 DIAGNOSIS — D225 Melanocytic nevi of trunk: Secondary | ICD-10-CM | POA: Diagnosis not present

## 2023-07-16 DIAGNOSIS — Z85828 Personal history of other malignant neoplasm of skin: Secondary | ICD-10-CM | POA: Diagnosis not present

## 2023-07-16 DIAGNOSIS — D2261 Melanocytic nevi of right upper limb, including shoulder: Secondary | ICD-10-CM | POA: Diagnosis not present

## 2023-07-16 DIAGNOSIS — C44612 Basal cell carcinoma of skin of right upper limb, including shoulder: Secondary | ICD-10-CM | POA: Diagnosis not present

## 2023-07-16 DIAGNOSIS — D1801 Hemangioma of skin and subcutaneous tissue: Secondary | ICD-10-CM | POA: Diagnosis not present

## 2023-07-16 DIAGNOSIS — L720 Epidermal cyst: Secondary | ICD-10-CM | POA: Diagnosis not present

## 2023-07-16 DIAGNOSIS — L820 Inflamed seborrheic keratosis: Secondary | ICD-10-CM | POA: Diagnosis not present

## 2023-07-20 ENCOUNTER — Other Ambulatory Visit: Payer: Self-pay | Admitting: Family Medicine

## 2023-07-20 DIAGNOSIS — K2100 Gastro-esophageal reflux disease with esophagitis, without bleeding: Secondary | ICD-10-CM

## 2023-07-27 ENCOUNTER — Other Ambulatory Visit: Payer: Self-pay | Admitting: Family Medicine

## 2023-09-09 NOTE — Progress Notes (Signed)
 HPI: FU coronary calcification/aortic atherosclerosis.  Chest CT February 2022 showed severe coronary atherosclerotic calcifications and aortic atherosclerosis.  Nuclear study May 2022 showed ejection fraction 60% and fixed inferior/apical defect suggestive of artifact but no ischemia.  Abdominal CT June 2023 showed no aneurysm.  Since last seen patient denies dyspnea, chest pain, palpitations or syncope.  Current Outpatient Medications  Medication Sig Dispense Refill   AMBULATORY NON FORMULARY MEDICATION Medication Name: CPAP set to 7 cm water  pressure.  Current machine has expired.  Please order through [facility]. 1 Units 0   ascorbic acid (VITAMIN C) 1000 MG tablet Take 1,000 mg by mouth daily with supper.      aspirin  EC 81 MG tablet Take 81 mg by mouth daily.     atorvastatin  (LIPITOR) 40 MG tablet TAKE 1 TABLET BY MOUTH DAILY. NEED LABS FOR FURTHER REFILLS 90 tablet 1   Cholecalciferol (VITAMIN D3) 2000 units TABS Take 2,000 Units by mouth at bedtime.      DENTA 5000 PLUS 1.1 % CREA dental cream See admin instructions.     famotidine  (PEPCID ) 20 MG tablet TAKE 1 TABLET BY MOUTH EVERYDAY AT BEDTIME 90 tablet 0   glucosamine-chondroitin 500-400 MG tablet Take 1 tablet by mouth daily with supper.      Omega-3 1000 MG CAPS Take 1 tablet by mouth daily.     Turmeric-Ginger  135-6 MG CHEW Chew 1 tablet by mouth in the morning and at bedtime. 1 tablet 0   valsartan -hydrochlorothiazide  (DIOVAN -HCT) 80-12.5 MG tablet TAKE 1 TABLET BY MOUTH EVERY DAY 90 tablet 1   No current facility-administered medications for this visit.     Past Medical History:  Diagnosis Date   Arthritis    osteoarthritis right hip   Carotid artery plaque    mild   Colon polyps    Erectile dysfunction    GERD (gastroesophageal reflux disease)    history- no current problems   History of nephrolithiasis    episodes x 6    HLD (hyperlipidemia)    HTN (hypertension)    Impaired hearing    bilateral hearing aids    Sleep apnea    cpap used with nasal clip   Thyroid  nodule     Past Surgical History:  Procedure Laterality Date   cataract Bilateral 05/21/2013   CYSTOSCOPY W/ STONE MANIPULATION     x 2   EYE SURGERY  cataracts for both eyes   HEMORRHOID SURGERY     INGUINAL HERNIA REPAIR Right    JOINT REPLACEMENT  2017, 2019   LAPAROSCOPIC DRAINAGE RENAL CYST     LITHOTRIPSY     x 1    TOTAL HIP ARTHROPLASTY Right 11/15/2015   Procedure: RIGHT TOTAL HIP ARTHROPLASTY ANTERIOR APPROACH;  Surgeon: Claiborne Crew, MD;  Location: [facility];  Service: Orthopedics;  Laterality: Right;   TOTAL HIP ARTHROPLASTY Left 10/22/2017   Procedure: LEFT TOTAL HIP ARTHROPLASTY ANTERIOR APPROACH;  Surgeon: Claiborne Crew, MD;  Location: [facility];  Service: Orthopedics;  Laterality: Left;    Social History   Socioeconomic History   Marital status: Married    Spouse name: Orelia Binet    Number of children: 3   Years of education: 18   Highest education level: Master's degree (e.g., MA, MS, MEng, MEd, MSW, MBA)  Occupational History   Occupation: Retired     Comment: Curator   Occupation: teaches pottery    Comment: one day a week  Tobacco Use   Smoking status:  Never   Smokeless tobacco: Never  Vaping Use   Vaping status: Never Used  Substance and Sexual Activity   Alcohol use: No   Drug use: No   Sexual activity: Yes    Birth control/protection: None  Other Topics Concern   Not on file  Social History Narrative   Lives with spouse. He has three children. 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 He now teaches pottery in [place] through the [employer]. He enjoys pottery, reading and volunteering.   Social Drivers of Corporate investment banker Strain: Low Risk  (02/25/2023)   Overall Financial Resource Strain (CARDIA)    Difficulty of Paying  Living Expenses: Not hard at all  Food Insecurity: No Food Insecurity (02/25/2023)   Hunger Vital Sign    Worried About Running Out of Food in the Last Year: Never true    Ran Out of Food in the Last Year: Never true  Transportation Needs: No Transportation Needs (02/25/2023)   PRAPARE - Administrator, Civil Service (Medical): No    Lack of Transportation (Non-Medical): No  Physical Activity: Insufficiently Active (02/25/2023)   Exercise Vital Sign    Days of Exercise per Week: 2 days    Minutes of Exercise per Session: 60 min  Stress: Stress Concern Present (02/25/2023)   [employer] of Occupational Health - Occupational Stress Questionnaire    Feeling of Stress : To some extent  Social Connections: Socially Integrated (02/26/2023)   Social Connection and Isolation Panel [NHANES]    Frequency of Communication with Friends and Family: Three times a week    Frequency of Social Gatherings with Friends and Family: Once a week    Attends Religious Services: More than 4 times per year    Active Member of [employer] or Organizations: Yes    Attends Engineer, structural: More than 4 times per year    Marital Status: Married  Catering manager Violence: Not At Risk (02/26/2023)   Humiliation, Afraid, Rape, and Kick questionnaire    Fear of Current or Ex-Partner: No    Emotionally Abused: No    Physically Abused: No    Sexually Abused: No    Family History  Problem Relation Age of Onset   Colon cancer Father    Cancer Father    Heart attack Mother 54   Hearing loss Mother    Heart disease Mother    Diabetes Other        1st degree relative    ROS: no fevers or chills, productive cough, hemoptysis, dysphasia, odynophagia, melena, hematochezia, dysuria, hematuria, rash, seizure activity, orthopnea, PND, pedal edema, claudication. Remaining systems are negative.  Physical Exam: Well-developed well-nourished in no acute distress.  Skin is warm and dry.  HEENT is  normal.  Neck is supple.  Chest is clear to auscultation with normal expansion.  Cardiovascular exam is regular rate and rhythm.  Abdominal exam nontender or distended. No masses palpated. Extremities show no edema. neuro grossly intact  EKG Interpretation Date/Time:  Monday Sep 23 2023 08:01:50 EDT Ventricular Rate:  60 PR Interval:  150 QRS Duration:  88 QT Interval:  390 QTC Calculation: 390 R Axis:   20  Text Interpretation: Normal sinus rhythm Low voltage QRS Confirmed by Alexandria Angel ([id number]) on 09/23/2023 8:04:56 AM    A/P  1 coronary artery  disease/coronary calcification-previous functional study showed no ischemia and patient continues to do well from a symptomatic standpoint with no chest pain.  Continue medical therapy with aspirin  and statin.  2 hypertension-blood pressure controlled.  Continue present medical regimen.  Check potassium and renal function.  He also complains of cramps and will check magnesium .  3 hyperlipidemia-continue statin.  Check lipids and liver.  Goal LDL less than 55.  4 sleep apnea-continue CPAP.  Alexandria Angel, MD

## 2023-09-23 ENCOUNTER — Encounter: Payer: Self-pay | Admitting: Cardiology

## 2023-09-23 ENCOUNTER — Ambulatory Visit (INDEPENDENT_AMBULATORY_CARE_PROVIDER_SITE_OTHER): Payer: Medicare Other | Admitting: Cardiology

## 2023-09-23 VITALS — BP 120/62 | HR 60 | Wt 176.0 lb

## 2023-09-23 DIAGNOSIS — I10 Essential (primary) hypertension: Secondary | ICD-10-CM

## 2023-09-23 DIAGNOSIS — E7800 Pure hypercholesterolemia, unspecified: Secondary | ICD-10-CM

## 2023-09-23 DIAGNOSIS — I2510 Atherosclerotic heart disease of native coronary artery without angina pectoris: Secondary | ICD-10-CM

## 2023-09-23 DIAGNOSIS — I700 Atherosclerosis of aorta: Secondary | ICD-10-CM

## 2023-09-23 NOTE — Patient Instructions (Signed)

## 2023-09-24 ENCOUNTER — Encounter: Payer: Self-pay | Admitting: *Deleted

## 2023-09-24 LAB — LIPID PANEL
Chol/HDL Ratio: 3.7 ratio (ref 0.0–5.0)
Cholesterol, Total: 126 mg/dL (ref 100–199)
HDL: 34 mg/dL — ABNORMAL LOW (ref >39)
LDL Chol Calc (NIH): 60 mg/dL (ref 0–99)
Triglycerides: 196 mg/dL — ABNORMAL HIGH (ref 0–149)
VLDL Cholesterol Cal: 32 mg/dL (ref 5–40)

## 2023-09-24 LAB — COMPREHENSIVE METABOLIC PANEL WITH GFR
ALT: 18 IU/L (ref 0–44)
AST: 16 IU/L (ref 0–40)
Albumin: 4.2 g/dL (ref 3.7–4.7)
Alkaline Phosphatase: 74 IU/L (ref 44–121)
BUN/Creatinine Ratio: 23 (ref 10–24)
BUN: 24 mg/dL (ref 8–27)
Bilirubin Total: 0.8 mg/dL (ref 0.0–1.2)
CO2: 26 mmol/L (ref 20–29)
Calcium: 9.9 mg/dL (ref 8.6–10.2)
Chloride: 103 mmol/L (ref 96–106)
Creatinine, Ser: 1.06 mg/dL (ref 0.76–1.27)
Globulin, Total: 1.8 g/dL (ref 1.5–4.5)
Glucose: 140 mg/dL — ABNORMAL HIGH (ref 70–99)
Potassium: 3.7 mmol/L (ref 3.5–5.2)
Sodium: 145 mmol/L — ABNORMAL HIGH (ref 134–144)
Total Protein: 6 g/dL (ref 6.0–8.5)
eGFR: 70 mL/min/{1.73_m2} (ref >59)

## 2023-09-24 LAB — MAGNESIUM: Magnesium: 1.9 mg/dL (ref 1.6–2.3)

## 2023-10-19 ENCOUNTER — Other Ambulatory Visit: Payer: Self-pay | Admitting: Family Medicine

## 2023-10-19 DIAGNOSIS — K2100 Gastro-esophageal reflux disease with esophagitis, without bleeding: Secondary | ICD-10-CM

## 2023-10-21 NOTE — Telephone Encounter (Signed)
 Patient scheduled for 11/26/23, thanks.

## 2023-10-21 NOTE — Telephone Encounter (Signed)
 Pls contact the pt to schedule appt with Dr. Greer Leak. Thx

## 2023-11-20 DIAGNOSIS — M25571 Pain in right ankle and joints of right foot: Secondary | ICD-10-CM | POA: Diagnosis not present

## 2023-11-25 ENCOUNTER — Ambulatory Visit (INDEPENDENT_AMBULATORY_CARE_PROVIDER_SITE_OTHER): Admitting: Family Medicine

## 2023-11-25 ENCOUNTER — Encounter: Payer: Self-pay | Admitting: Family Medicine

## 2023-11-25 VITALS — BP 129/67 | HR 65 | Ht 65.0 in | Wt 173.0 lb

## 2023-11-25 DIAGNOSIS — I10 Essential (primary) hypertension: Secondary | ICD-10-CM | POA: Diagnosis not present

## 2023-11-25 DIAGNOSIS — M109 Gout, unspecified: Secondary | ICD-10-CM | POA: Diagnosis not present

## 2023-11-25 DIAGNOSIS — I700 Atherosclerosis of aorta: Secondary | ICD-10-CM

## 2023-11-25 DIAGNOSIS — M7989 Other specified soft tissue disorders: Secondary | ICD-10-CM

## 2023-11-25 DIAGNOSIS — K2100 Gastro-esophageal reflux disease with esophagitis, without bleeding: Secondary | ICD-10-CM

## 2023-11-25 DIAGNOSIS — J45991 Cough variant asthma: Secondary | ICD-10-CM

## 2023-11-25 DIAGNOSIS — E785 Hyperlipidemia, unspecified: Secondary | ICD-10-CM

## 2023-11-25 MED ORDER — COLCHICINE 0.6 MG PO TABS: ORAL_TABLET | ORAL | 1 refills | Status: DC

## 2023-11-25 MED ORDER — FAMOTIDINE 20 MG PO TABS: ORAL_TABLET | ORAL | 3 refills | Status: AC

## 2023-11-25 NOTE — Assessment & Plan Note (Signed)
Doing well. No recent flares.   

## 2023-11-25 NOTE — Assessment & Plan Note (Signed)
   Lab Results  Component Value Date   CHOL 126 09/23/2023   HDL 34 (L) 09/23/2023   LDLCALC 60 09/23/2023   LDLDIRECT 106.9 03/07/2009   TRIG 196 (H) 09/23/2023   CHOLHDL 3.7 09/23/2023  Continue daily statin.

## 2023-11-25 NOTE — Assessment & Plan Note (Signed)
Continue daily statin. 

## 2023-11-25 NOTE — Assessment & Plan Note (Signed)
 Controlled with current famotidine  20 mg daily.  Will refill medication.

## 2023-11-25 NOTE — Assessment & Plan Note (Signed)
 Well controlled. Continue current regimen. Follow up in  6 mo

## 2023-11-25 NOTE — Assessment & Plan Note (Signed)
 Will recheck uric acid level today it was 9 almost 2 years ago but he has not had any flares until recently I think his current red and inflamed 2nd and 3rd toes are gout most wonder if the original medial foot pain is also possibly related to gout as well recommend treatment with colchicine .  If pain not improving then keep follow-up with orthopedist next week.

## 2023-11-25 NOTE — Progress Notes (Signed)
 Acute Office Visit  Subjective:     Patient ID: [name], male    DOB: 1941-03-19, 83 y.o.   MRN: [id number]  Chief Complaint  Patient presents with   Gout    HPI Patient is in today for Gout attack.  He started having pain along the medial part of his foot close to the proximal head of the first metatarsal.  He had pain for about 2 days and then made an appointment with the orthopedist on June 30.  He says that they did x-rays and diagnosed him with plantar fasciitis.  After 1 day of starting to do the stretches he noticed that his 2nd and 3rd toes started to get swollen red and inflamed.  He denies any trauma, injury cuts or wounds.     He is also due for 6 mo f/u  Hypertension- Pt denies chest pain, SOB, dizziness, or heart palpitations.  Taking meds as directed w/o problems.  Denies medication side effects.  He had labs done in May with his cardiologist.  He says in regards to his asthma he has been doing really well no recent flares or exacerbations.  He has been maintaining a healthy weight. He feels like the famotidine  does keep his GERD under control and would like to be able to continue with that medication.   ROS      Objective:    BP 129/67   Pulse 65   Ht 5' 5 (1.651 m)   Wt 173 lb 0.6 oz (78.5 kg)   SpO2 98%   BMI 28.80 kg/m    Physical Exam Vitals and nursing note reviewed.  Constitutional:      Appearance: Normal appearance.  HENT:     Head: Normocephalic and atraumatic.  Eyes:     Conjunctiva/sclera: Conjunctivae normal.  Cardiovascular:     Rate and Rhythm: Normal rate and regular rhythm.  Pulmonary:     Effort: Pulmonary effort is normal.     Breath sounds: Normal breath sounds.  Musculoskeletal:     Comments: Tender over the proximal head of the first metatarsal.  Nontender along the rest of the metatarsal no pain at the base of the heel or tenderness dorsal pedal pulse 1+.  Second toe is completely swollen and very tender over that  DIP joint.  Even just slight touch is tender.  Third toe mildly swollen and a little less tender over the joint space.  Nontender over the metatarsal heads.  Skin:    General: Skin is warm and dry.  Neurological:     Mental Status: He is alert.  Psychiatric:        Mood and Affect: Mood normal.     No results found for any visits on 11/25/23.      Assessment & Plan:   Problem List Items Addressed This Visit       Cardiovascular and Mediastinum   Essential hypertension - Primary   Well controlled. Continue current regimen. Follow up in  6 mo       Aortic atherosclerosis (HCC)   Continue daily statin.         Respiratory   Cough variant asthma   Doing well. No recent flares.         Digestive   Gastroesophageal reflux disease with esophagitis without hemorrhage   Controlled with current famotidine  20 mg daily.  Will refill medication.      Relevant Medications   famotidine  (PEPCID ) 20 MG tablet  Other   Hyperlipidemia     Lab Results  Component Value Date   CHOL 126 09/23/2023   HDL 34 (L) 09/23/2023   LDLCALC 60 09/23/2023   LDLDIRECT 106.9 03/07/2009   TRIG 196 (H) 09/23/2023   CHOLHDL 3.7 09/23/2023  Continue daily statin.       Gout attack   Will recheck uric acid level today it was 9 almost 2 years ago but he has not had any flares until recently I think his current red and inflamed 2nd and 3rd toes are gout most wonder if the original medial foot pain is also possibly related to gout as well recommend treatment with colchicine .  If pain not improving then keep follow-up with orthopedist next week.      Relevant Orders   Uric acid   Sedimentation rate   Other Visit Diagnoses       Toe swelling       Relevant Orders   Uric acid   Sedimentation rate      Toe swelling-most consistent with gout flare.  Okay to continue to ice.  Discontinue ibuprofen  and will switch to colchicine  if not improving consider round of prednisone .  If pain  continues keep follow-up with orthopedist.  Meds ordered this encounter  Medications   colchicine  0.6 MG tablet    Sig: Take one tab twice  a day on Day 1, then one a day for 10 days.    Dispense:  24 tablet    Refill:  1   famotidine  (PEPCID ) 20 MG tablet    Sig: TAKE 1 TABLET BY MOUTH EVERYDAY AT BEDTIME    Dispense:  90 tablet    Refill:  3    No follow-ups on file.  Dorothyann Byars, MD

## 2023-11-26 ENCOUNTER — Ambulatory Visit: Admitting: Family Medicine

## 2023-11-26 ENCOUNTER — Encounter: Payer: Self-pay | Admitting: Family Medicine

## 2023-11-26 ENCOUNTER — Ambulatory Visit: Payer: Self-pay | Admitting: Family Medicine

## 2023-11-26 LAB — SEDIMENTATION RATE: Sed Rate: 3 mm/h (ref 0–30)

## 2023-11-26 LAB — URIC ACID: Uric Acid: 7.2 mg/dL (ref 3.8–8.4)

## 2023-11-26 NOTE — Progress Notes (Signed)
 Hi [name], uric acid is slightly elevated at 7.2 but not as high as it was a couple years ago so that is actually really great.  Your inflammatory marker was normal.  I did that as well just to rule out any other kind of autoimmune issue.  Keep an eye on that toe over the next day or 2.  If you feel like the pain is improving and the swelling is improving wonderful if not like a said before we can always try switching to prednisone .

## 2023-11-28 ENCOUNTER — Encounter

## 2023-12-09 ENCOUNTER — Other Ambulatory Visit: Payer: Self-pay | Admitting: Family Medicine

## 2024-03-07 ENCOUNTER — Other Ambulatory Visit: Payer: Self-pay | Admitting: Family Medicine

## 2024-03-22 ENCOUNTER — Encounter: Payer: Self-pay | Admitting: Family Medicine

## 2024-03-22 DIAGNOSIS — G4733 Obstructive sleep apnea (adult) (pediatric): Secondary | ICD-10-CM

## 2024-03-23 NOTE — Telephone Encounter (Signed)
 I agree Jon though probably have to have a copy of an office visit to document usage.  If we can get him scheduled either in person or virtual spine and then also try to call them to get a download report so that we can document that and go over it during the visit that would be wonderful.

## 2024-03-23 NOTE — Telephone Encounter (Signed)
 I called [facility] again and was transferred to a voicemail. I left a message for a return call.

## 2024-03-24 NOTE — Telephone Encounter (Signed)
 I called [facility] again today. They state they will send the request for the download. Hopefully will will receive the download soon.

## 2024-03-27 ENCOUNTER — Encounter: Payer: Self-pay | Admitting: Family Medicine

## 2024-03-27 NOTE — Telephone Encounter (Signed)
 I called again today and they state they will fax the download.

## 2024-03-30 MED ORDER — AMBULATORY NON FORMULARY MEDICATION: 0 refills | Status: AC

## 2024-03-30 NOTE — Telephone Encounter (Signed)
 Put on  your desk Ang

## 2024-03-30 NOTE — Telephone Encounter (Signed)
 See other MyChart message

## 2024-03-31 NOTE — Telephone Encounter (Signed)
 I reached out to patient to schedule an office visit for the CPAP supplies. It needs to be documented in an office note.

## 2024-04-14 ENCOUNTER — Encounter: Payer: Self-pay | Admitting: Family Medicine

## 2024-04-14 ENCOUNTER — Ambulatory Visit: Admitting: Family Medicine

## 2024-04-14 VITALS — BP 130/70 | HR 68 | Ht 66.0 in | Wt 171.5 lb

## 2024-04-14 DIAGNOSIS — G4733 Obstructive sleep apnea (adult) (pediatric): Secondary | ICD-10-CM | POA: Diagnosis not present

## 2024-04-14 MED ORDER — AMBULATORY NON FORMULARY MEDICATION: 99 refills | Status: DC

## 2024-04-14 NOTE — Assessment & Plan Note (Signed)
 Obstructive sleep apnea on CPAP therapy Obstructive sleep apnea managed with CPAP. Compliance excellent, usage over 29/30 days, averaging 8+ hours/night. AHI 5.8, target <4. Reports dry mouth, possibly due to CPAP settings. No significant air leaks. Pressure setting discrepancy noted. - Adjust CPAP pressure from 7 to 8 to reduce AHI. - Request CPAP data download in 1-2 months to assess AHI post-adjustment. - Coordinate with Daniel Mcalpine for remote CPAP settings adjustment.

## 2024-04-14 NOTE — Progress Notes (Signed)
 Established Patient Office Visit  Patient ID: [name], male    DOB: [birth year]  Age: 83 y.o. MRN: [id number] PCP: Alvan Dorothyann BIRCH, MD  Chief Complaint  Patient presents with   CPAP follow up     Patient declines vaccines today     Subjective:     HPI  Discussed the use of AI scribe software for clinical note transcription with the patient, who gave verbal consent to proceed.  History of Present Illness [name] is an 83 year old male who presents for CPAP compliance review.  Obstructive sleep apnea and cpap therapy - CPAP therapy for obstructive sleep apnea for approximately 7-8 years - Consistent nightly use for over 8 hours, 29 out of the last 30 days - Significant improvement in morning energy levels since initiation of CPAP - Feels awake and ready to start the day after using CPAP - Current apnea-hypopnea index (AHI) on treatment is 5.8 - No significant air leaks from nasal pillow mask; does not wake up due to air escaping from mask - No significant stress-related changes in AHI  Cpap equipment issues - Recently acquired a new CPAP machine after expiration of original device - Current machine registers a pressure of 4.5, though set to 7 - Experiences dry mouth since obtaining new machine - Technician previously adjusted machine to address dry mouth - Uses a nasal pillow mask that covers the mouth - Humidifier chamber filled nightly with distilled water   Cpap supply acquisition difficulties - Difficulty obtaining supplies due to lack of record-keeping by supplier - Has documentation of previous transactions     ROS    Objective:     BP 130/70   Pulse 68   Ht 5' 6 (1.676 m)   Wt 171 lb 8 oz (77.8 kg)   SpO2 97%   BMI 27.68 kg/m    Physical Exam Vitals and nursing note reviewed.  Constitutional:      Appearance: Normal appearance.  HENT:     Head: Normocephalic and atraumatic.  Eyes:     Conjunctiva/sclera: Conjunctivae  normal.  Cardiovascular:     Rate and Rhythm: Normal rate and regular rhythm.  Pulmonary:     Effort: Pulmonary effort is normal.     Breath sounds: Normal breath sounds.  Skin:    General: Skin is warm and dry.  Neurological:     Mental Status: He is alert.  Psychiatric:        Mood and Affect: Mood normal.      No results found for any visits on 04/14/24.    The ASCVD Risk score (Arnett DK, et al., 2019) failed to calculate for the following reasons:   The 2019 ASCVD risk score is only valid for ages 48 to 44    Assessment & Plan:   Problem List Items Addressed This Visit       Respiratory   OSA (obstructive sleep apnea) - Primary   Obstructive sleep apnea on CPAP therapy Obstructive sleep apnea managed with CPAP. Compliance excellent, usage over 29/30 days, averaging 8+ hours/night. AHI 5.8, target <4. Reports dry mouth, possibly due to CPAP settings. No significant air leaks. Pressure setting discrepancy noted. - Adjust CPAP pressure from 7 to 8 to reduce AHI. - Request CPAP data download in 1-2 months to assess AHI post-adjustment. - Coordinate with Daniel Mcalpine for remote CPAP settings adjustment.      Relevant Medications   AMBULATORY NON FORMULARY MEDICATION    Assessment and Plan  Assessment & Plan   Ptosis of eyelid Ptosis with lid resting on eyelashes. Upcoming eye specialist appointment in December. Insurance coverage for surgery depends on severity. - Discuss ptosis with eye specialist during December appointment.    Return if symptoms worsen or fail to improve.    Dorothyann Byars, MD [facility] at [facility]

## 2024-04-29 ENCOUNTER — Telehealth: Payer: Self-pay | Admitting: Family Medicine

## 2024-04-29 ENCOUNTER — Other Ambulatory Visit: Payer: Self-pay | Admitting: *Deleted

## 2024-04-29 DIAGNOSIS — H02831 Dermatochalasis of right upper eyelid: Secondary | ICD-10-CM | POA: Diagnosis not present

## 2024-04-29 DIAGNOSIS — Z961 Presence of intraocular lens: Secondary | ICD-10-CM | POA: Diagnosis not present

## 2024-04-29 DIAGNOSIS — H18613 Keratoconus, stable, bilateral: Secondary | ICD-10-CM | POA: Diagnosis not present

## 2024-04-29 DIAGNOSIS — G4733 Obstructive sleep apnea (adult) (pediatric): Secondary | ICD-10-CM

## 2024-04-29 DIAGNOSIS — H02834 Dermatochalasis of left upper eyelid: Secondary | ICD-10-CM | POA: Diagnosis not present

## 2024-04-29 MED ORDER — AMBULATORY NON FORMULARY MEDICATION: 99 refills | Status: DC

## 2024-04-29 MED ORDER — AMBULATORY NON FORMULARY MEDICATION: 99 refills | Status: AC

## 2024-04-29 NOTE — Telephone Encounter (Signed)
 please schedule an eval with Dr. Eluterio for dermatochalasis BUL.  Per [employer]

## 2024-04-29 NOTE — Telephone Encounter (Signed)
 Pt came into the office states CPAP pressure settings were changed from 4.5-7cm to 8cm. Patient states he gets a headache with the setting of 8cm.   Spoke with [facility], was told that machine settings do not start at 4.5, only even numbers. New orders have been faxed to [facility] at [phone]. Patient was also given a copy of the orders.

## 2024-04-29 NOTE — Telephone Encounter (Signed)
 Order placed

## 2024-04-30 ENCOUNTER — Ambulatory Visit: Admitting: Family Medicine

## 2024-05-01 DIAGNOSIS — Z23 Encounter for immunization: Secondary | ICD-10-CM | POA: Diagnosis not present

## 2024-06-05 ENCOUNTER — Other Ambulatory Visit: Payer: Self-pay | Admitting: Family Medicine

## 2024-06-09 ENCOUNTER — Other Ambulatory Visit: Payer: Self-pay | Admitting: Family Medicine
